# Patient Record
Sex: Female | Born: 1965
Health system: Southern US, Community
[De-identification: ages and names within clinical notes are randomized; demographics above are authoritative.]

## PROBLEM LIST (undated history)

## (undated) DIAGNOSIS — D649 Anemia, unspecified: Secondary | ICD-10-CM

## (undated) DIAGNOSIS — E079 Disorder of thyroid, unspecified: Secondary | ICD-10-CM

## (undated) DIAGNOSIS — E039 Hypothyroidism, unspecified: Secondary | ICD-10-CM

## (undated) DIAGNOSIS — D496 Neoplasm of unspecified behavior of brain: Secondary | ICD-10-CM

## (undated) DIAGNOSIS — C801 Malignant (primary) neoplasm, unspecified: Secondary | ICD-10-CM

## (undated) DIAGNOSIS — F419 Anxiety disorder, unspecified: Secondary | ICD-10-CM

## (undated) DIAGNOSIS — F32A Depression, unspecified: Secondary | ICD-10-CM

## (undated) DIAGNOSIS — M1990 Unspecified osteoarthritis, unspecified site: Secondary | ICD-10-CM

## (undated) DIAGNOSIS — M75100 Unspecified rotator cuff tear or rupture of unspecified shoulder, not specified as traumatic: Secondary | ICD-10-CM

## (undated) HISTORY — DX: Neoplasm of unspecified behavior of brain: D49.6

## (undated) HISTORY — DX: Depression, unspecified: F32.A

## (undated) HISTORY — DX: Anemia, unspecified: D64.9

## (undated) HISTORY — DX: Unspecified rotator cuff tear or rupture of unspecified shoulder, not specified as traumatic: M75.100

## (undated) HISTORY — DX: Malignant (primary) neoplasm, unspecified: C80.1

## (undated) HISTORY — PX: TUBAL LIGATION: SHX77

## (undated) HISTORY — DX: Unspecified osteoarthritis, unspecified site: M19.90

## (undated) HISTORY — DX: Hypothyroidism, unspecified: E03.9

## (undated) HISTORY — PX: BRAIN SURGERY: SHX531

## (undated) HISTORY — DX: Anxiety disorder, unspecified: F41.9

## (undated) HISTORY — PX: EYE SURGERY: SHX253

## (undated) HISTORY — DX: Disorder of thyroid, unspecified: E07.9

---

## 1999-05-19 ENCOUNTER — Encounter: Payer: Self-pay | Admitting: Family Medicine

## 1999-05-19 ENCOUNTER — Encounter: Admission: RE | Admit: 1999-05-19 | Discharge: 1999-05-19 | Payer: Self-pay | Admitting: Family Medicine

## 1999-05-19 HISTORY — PX: OTHER SURGICAL HISTORY: SHX169

## 2000-08-19 ENCOUNTER — Encounter: Payer: Self-pay | Admitting: Family Medicine

## 2000-09-11 ENCOUNTER — Other Ambulatory Visit: Admission: RE | Admit: 2000-09-11 | Discharge: 2000-09-11 | Payer: Self-pay | Admitting: Obstetrics and Gynecology

## 2001-01-01 ENCOUNTER — Encounter: Admission: RE | Admit: 2001-01-01 | Discharge: 2001-02-06 | Payer: Self-pay | Admitting: Occupational Medicine

## 2001-01-30 ENCOUNTER — Ambulatory Visit (HOSPITAL_COMMUNITY): Admission: RE | Admit: 2001-01-30 | Discharge: 2001-01-30 | Payer: Self-pay | Admitting: Obstetrics & Gynecology

## 2001-01-30 ENCOUNTER — Encounter: Payer: Self-pay | Admitting: Obstetrics & Gynecology

## 2001-12-03 ENCOUNTER — Other Ambulatory Visit: Admission: RE | Admit: 2001-12-03 | Discharge: 2001-12-03 | Payer: Self-pay | Admitting: Gynecology

## 2002-05-22 ENCOUNTER — Other Ambulatory Visit: Admission: RE | Admit: 2002-05-22 | Discharge: 2002-05-22 | Payer: Self-pay | Admitting: Obstetrics and Gynecology

## 2002-07-03 ENCOUNTER — Encounter: Payer: Self-pay | Admitting: Obstetrics and Gynecology

## 2002-07-03 ENCOUNTER — Ambulatory Visit (HOSPITAL_COMMUNITY): Admission: RE | Admit: 2002-07-03 | Discharge: 2002-07-03 | Payer: Self-pay | Admitting: Obstetrics and Gynecology

## 2002-11-09 ENCOUNTER — Inpatient Hospital Stay (HOSPITAL_COMMUNITY): Admission: AD | Admit: 2002-11-09 | Discharge: 2002-11-09 | Payer: Self-pay | Admitting: Obstetrics and Gynecology

## 2002-11-24 ENCOUNTER — Inpatient Hospital Stay (HOSPITAL_COMMUNITY): Admission: AD | Admit: 2002-11-24 | Discharge: 2002-11-26 | Payer: Self-pay | Admitting: Obstetrics and Gynecology

## 2002-11-25 ENCOUNTER — Encounter (INDEPENDENT_AMBULATORY_CARE_PROVIDER_SITE_OTHER): Payer: Self-pay

## 2002-11-27 ENCOUNTER — Encounter: Admission: RE | Admit: 2002-11-27 | Discharge: 2002-12-27 | Payer: Self-pay | Admitting: Obstetrics and Gynecology

## 2003-09-22 ENCOUNTER — Other Ambulatory Visit: Admission: RE | Admit: 2003-09-22 | Discharge: 2003-09-22 | Payer: Self-pay | Admitting: Obstetrics and Gynecology

## 2003-11-11 ENCOUNTER — Encounter: Admission: RE | Admit: 2003-11-11 | Discharge: 2003-11-11 | Payer: Self-pay | Admitting: Obstetrics and Gynecology

## 2004-02-02 ENCOUNTER — Ambulatory Visit: Payer: Self-pay | Admitting: Family Medicine

## 2004-02-25 ENCOUNTER — Ambulatory Visit: Payer: Self-pay | Admitting: Family Medicine

## 2004-02-29 ENCOUNTER — Ambulatory Visit: Payer: Self-pay | Admitting: Family Medicine

## 2004-03-06 ENCOUNTER — Ambulatory Visit: Payer: Self-pay | Admitting: Family Medicine

## 2004-04-03 ENCOUNTER — Ambulatory Visit: Payer: Self-pay | Admitting: Family Medicine

## 2004-04-11 ENCOUNTER — Ambulatory Visit: Payer: Self-pay | Admitting: Professional

## 2004-04-20 ENCOUNTER — Ambulatory Visit: Payer: Self-pay | Admitting: Professional

## 2004-05-20 HISTORY — PX: OTHER SURGICAL HISTORY: SHX169

## 2004-06-06 ENCOUNTER — Ambulatory Visit: Payer: Self-pay | Admitting: Family Medicine

## 2004-06-07 ENCOUNTER — Inpatient Hospital Stay (HOSPITAL_COMMUNITY): Admission: EM | Admit: 2004-06-07 | Discharge: 2004-06-26 | Payer: Self-pay | Admitting: Emergency Medicine

## 2004-06-07 ENCOUNTER — Encounter: Admission: RE | Admit: 2004-06-07 | Discharge: 2004-06-07 | Payer: Self-pay | Admitting: Family Medicine

## 2004-06-09 ENCOUNTER — Encounter (INDEPENDENT_AMBULATORY_CARE_PROVIDER_SITE_OTHER): Payer: Self-pay | Admitting: *Deleted

## 2004-06-28 ENCOUNTER — Inpatient Hospital Stay (HOSPITAL_COMMUNITY): Admission: EM | Admit: 2004-06-28 | Discharge: 2004-06-30 | Payer: Self-pay | Admitting: Emergency Medicine

## 2004-07-14 ENCOUNTER — Ambulatory Visit: Payer: Self-pay | Admitting: Family Medicine

## 2004-09-20 ENCOUNTER — Ambulatory Visit: Payer: Self-pay | Admitting: Family Medicine

## 2004-10-02 ENCOUNTER — Ambulatory Visit: Payer: Self-pay | Admitting: Family Medicine

## 2004-12-28 ENCOUNTER — Ambulatory Visit (HOSPITAL_COMMUNITY): Admission: RE | Admit: 2004-12-28 | Discharge: 2004-12-28 | Payer: Self-pay | Admitting: Neurosurgery

## 2005-06-27 ENCOUNTER — Ambulatory Visit (HOSPITAL_COMMUNITY): Admission: RE | Admit: 2005-06-27 | Discharge: 2005-06-27 | Payer: Self-pay | Admitting: Neurosurgery

## 2005-07-27 IMAGING — CT CT HEAD W/O CM
1 of 2 series · 13 of 30 positions shown, 17 images · non-contrast
Comparison: none

CLINICAL DATA: Status post craniotomy.

[Series 2: brain · axial · 0.47mm/px · z∈[+122,+249]mm · 13 of 32 slices shown, 17 images]
[im 3/32  brain]
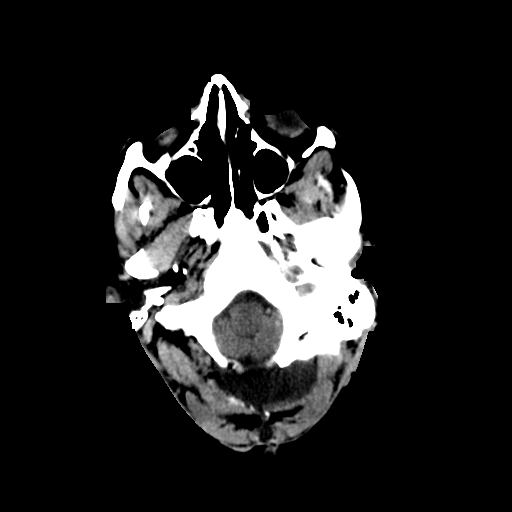
[im 3/32  bone]
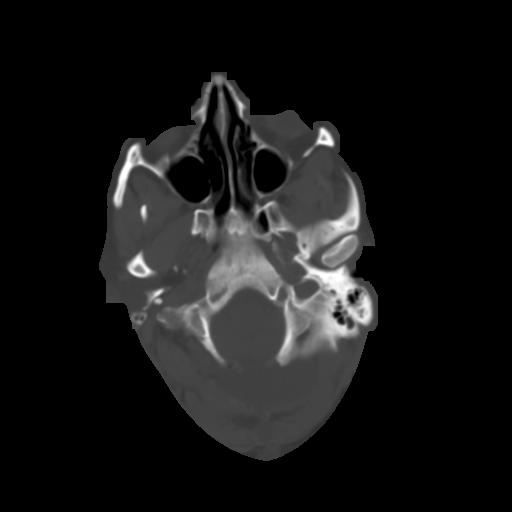
[im 5/32  brain]
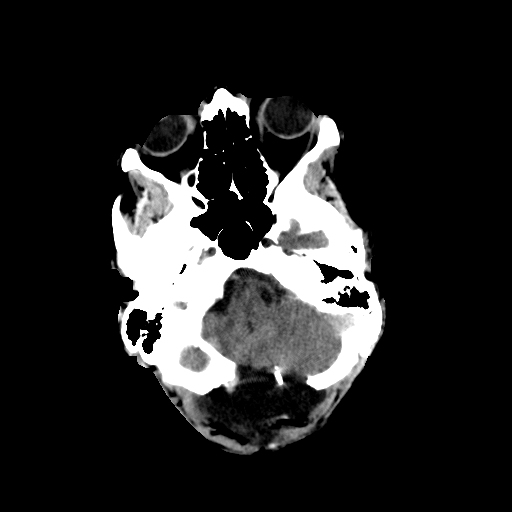
[im 7/32  brain]
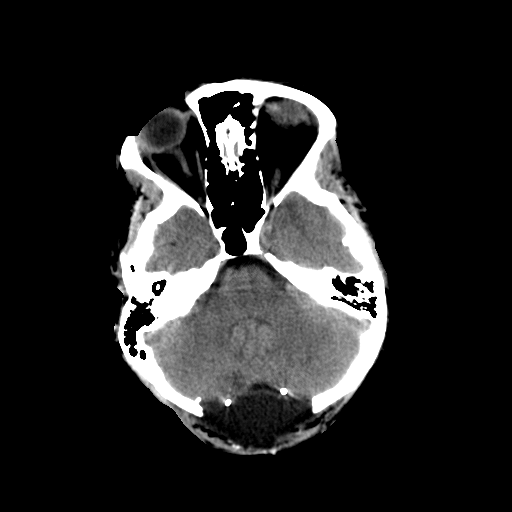
[im 9/32  brain]
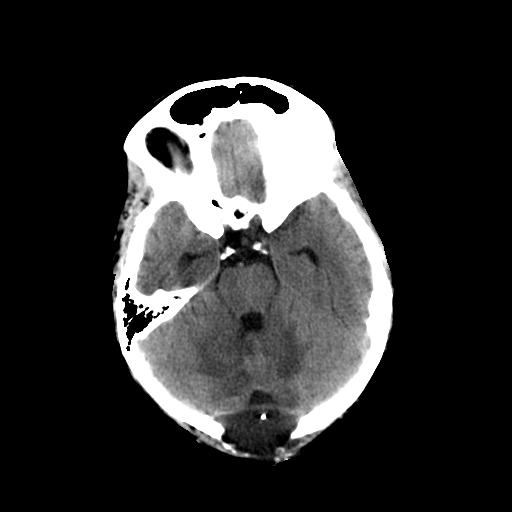
[im 12/32  brain]
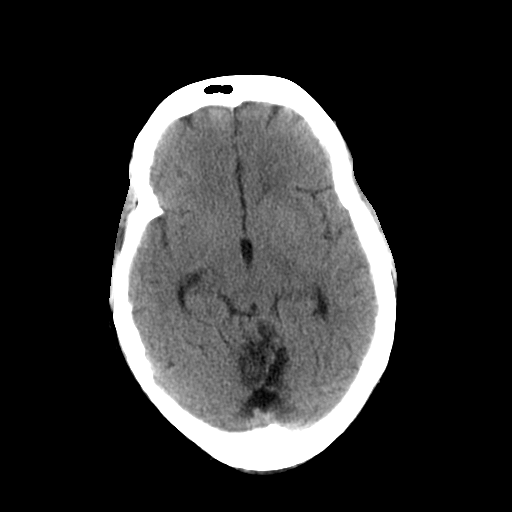
[im 12/32  bone]
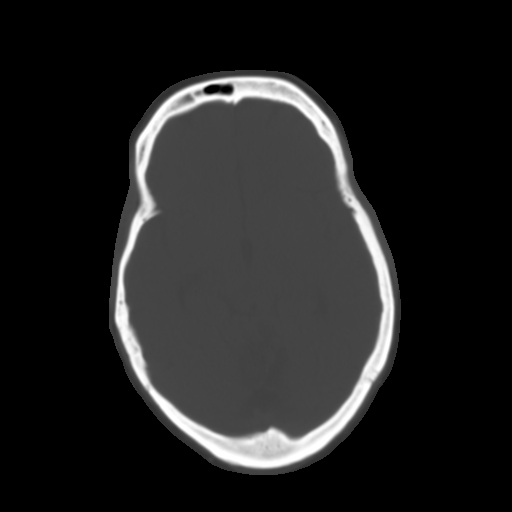
[im 14/32  brain]
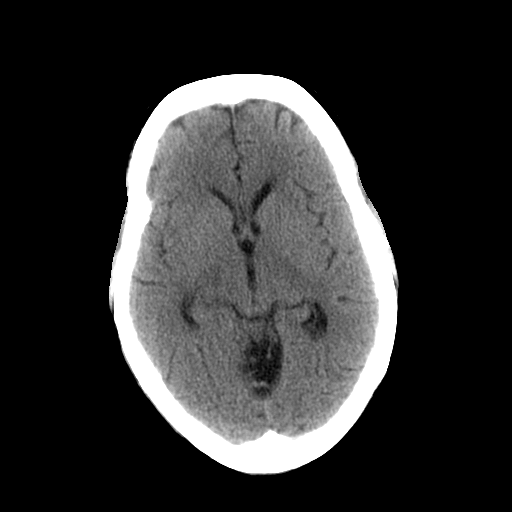
[im 16/32  brain]
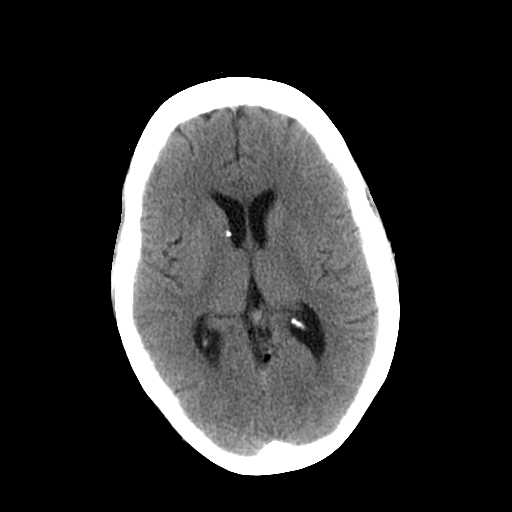
[im 18/32  brain]
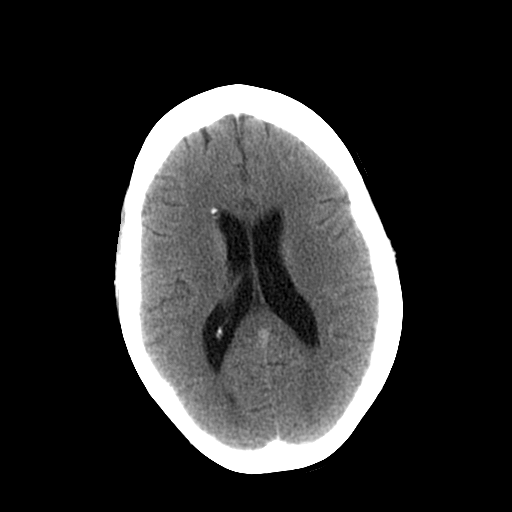
[im 20/32  brain]
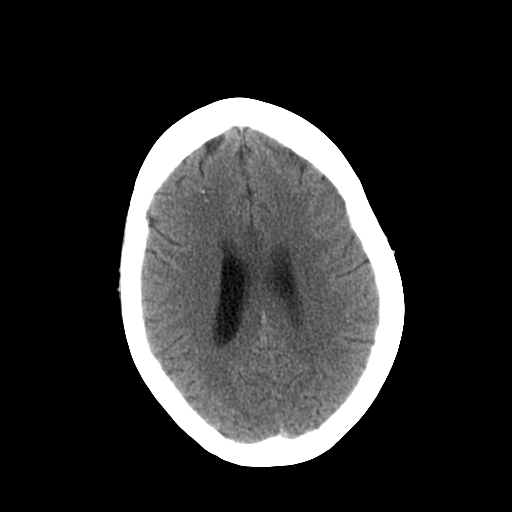
[im 20/32  bone]
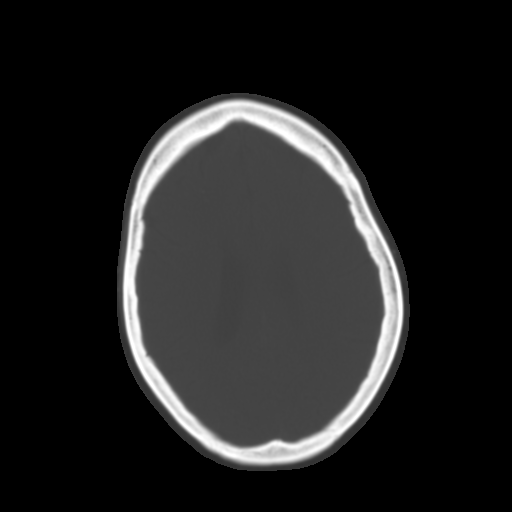
[im 23/32  brain]
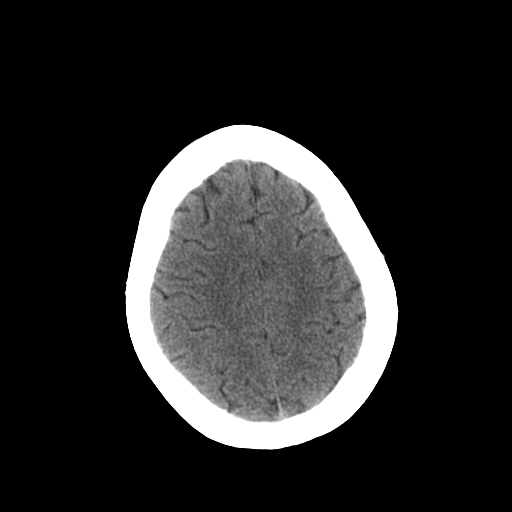
[im 25/32  brain]
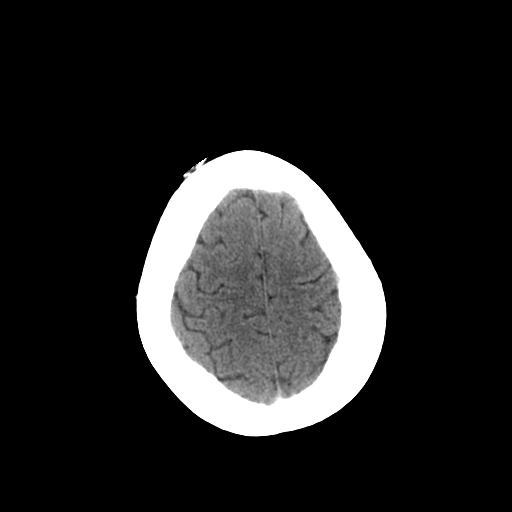
[im 27/32  brain]
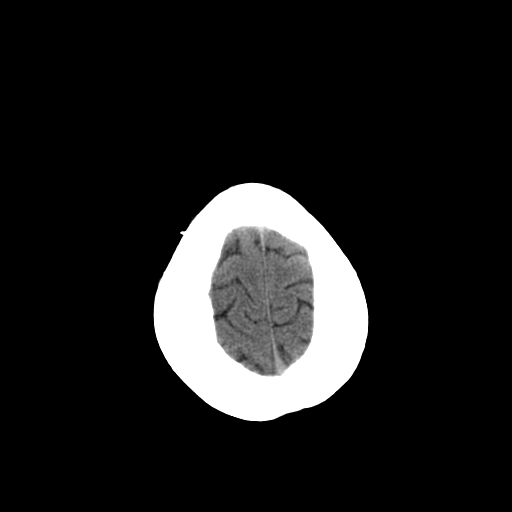
[im 29/32  brain]
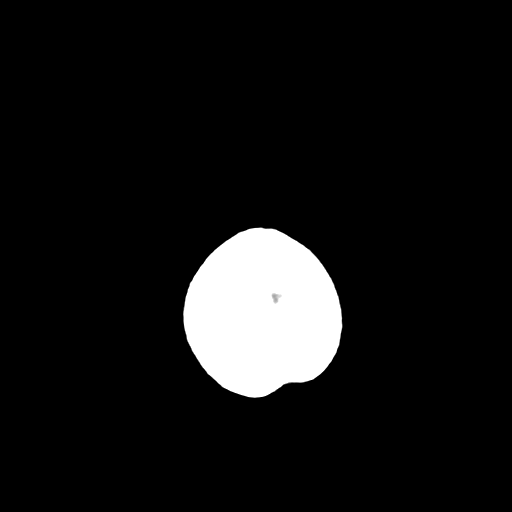
[im 29/32  bone]
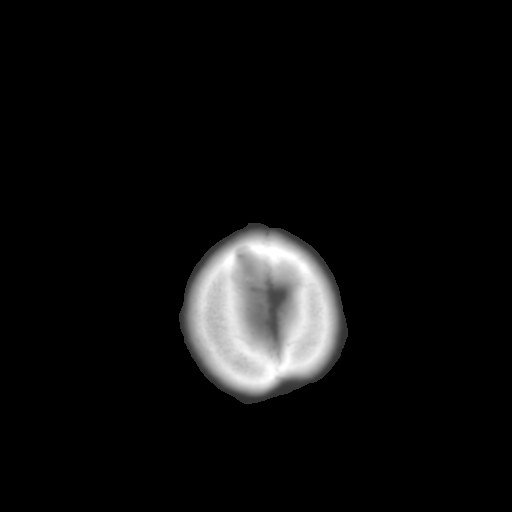

[13 of 30 positions shown; findings below may reference images not displayed]

CT head without contrast:

Comparison 06/20/2004. Changes of posterior occipital craniotomy are again evident
with extra-axial low attenuation collection at the level of the craniotomy
stable since previous exam. There is persistent low attenuation in the vermis
and cerebellar hemispheres centrally. No hydrocephalus. Calcifications along
presumed track of previous right frontal ventriculostomy catheter are noted.
Negative for acute intracranial hemorrhage, midline shift, or mass-effect. Bone
windows demonstrate postoperative changes as before. There is a prominent
retention cyst or polyp incidentally noted in the right maxillary sinus.
IMPRESSION: 1. Stable appearance since 06/20/2004, with postoperative changes and cerebellar  
edema as detailed above.
2. No hydrocephalus

## 2005-08-02 IMAGING — CT CT HEAD W/O CM
1 of 2 series · 13 of 30 positions shown, 17 images · IV contrast (agent unspecified)
Comparison: none

CLINICAL DATA: 39-year-old; headache; nausea and vomiting; hx of brain tumor removal
 CT HEAD WITHOUT CONTRAST:
 Standard head CT was performed without contrast and is compared to previous from 06/24/04.
 The ventricular catheters are unchanged.  The tip of the larger catheter appears to be in the left basal ganglia region and the tip of the smaller catheter does not appear to be in the ventricle.  There are post surgical changes involving the posterior fossa with a postoperative fluid collection which is stable.  Small area of probable encephalomalacia.  The ventricles are in the midline without mass effect or shift.  No evidence for intracranial hemorrhage or edema.

[Series 2: brain · axial · 0.47mm/px · z∈[+130,+260]mm · 13 of 32 slices shown, 17 images]
[im 3/32  brain]
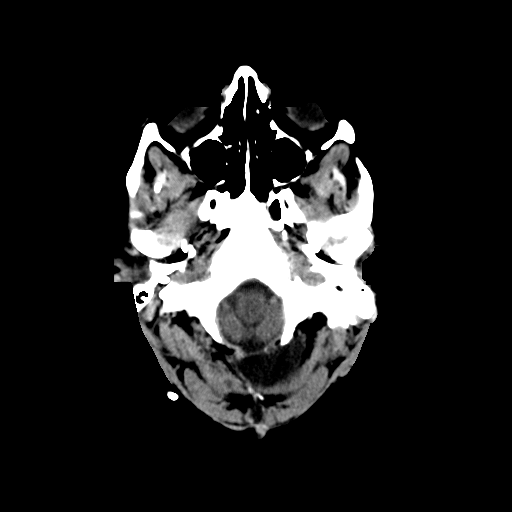
[im 3/32  bone]
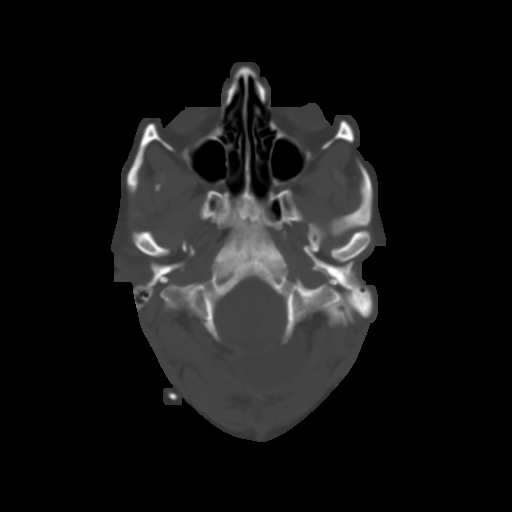
[im 5/32  brain]
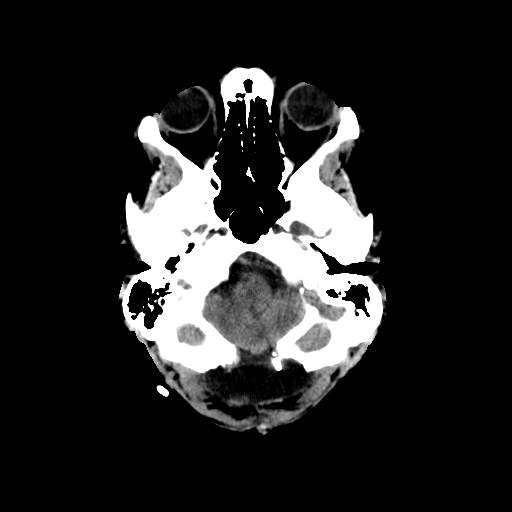
[im 7/32  brain]
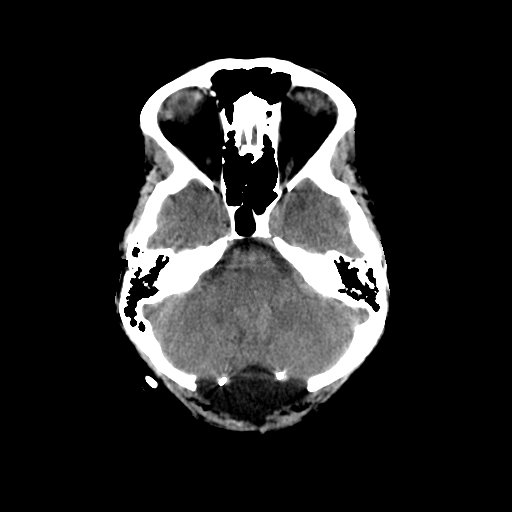
[im 9/32  brain]
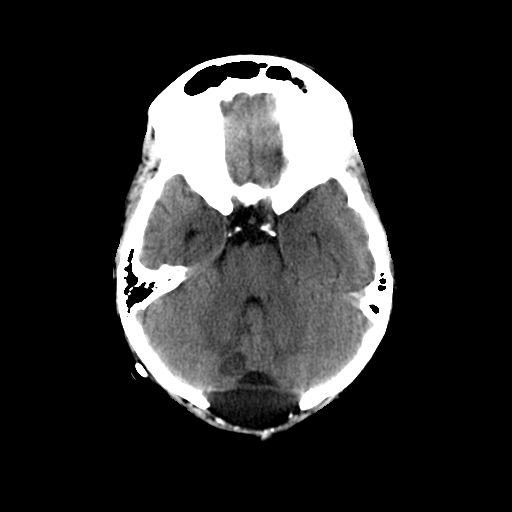
[im 12/32  brain]
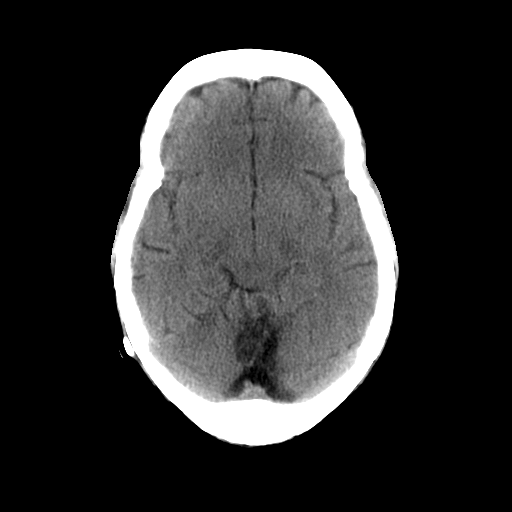
[im 12/32  bone]
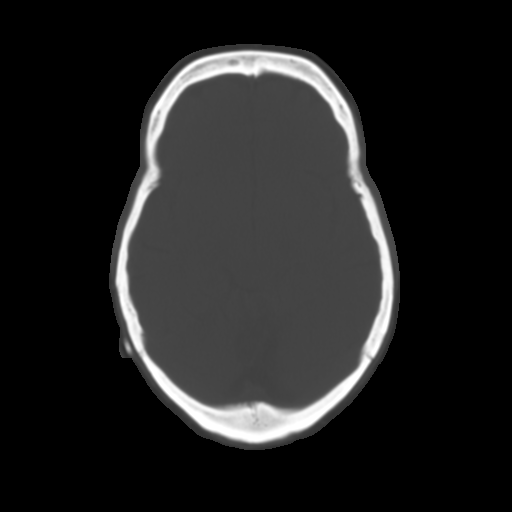
[im 14/32  brain]
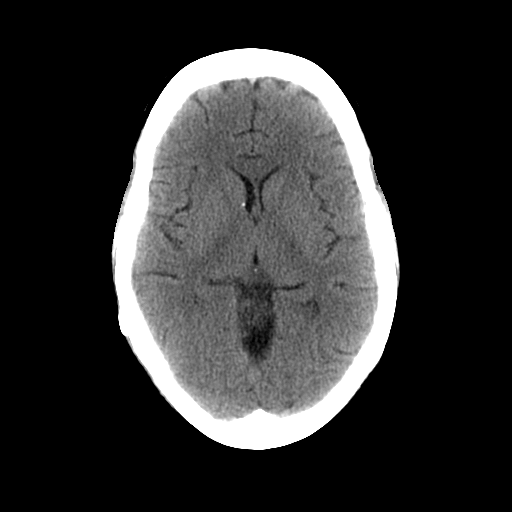
[im 16/32  brain]
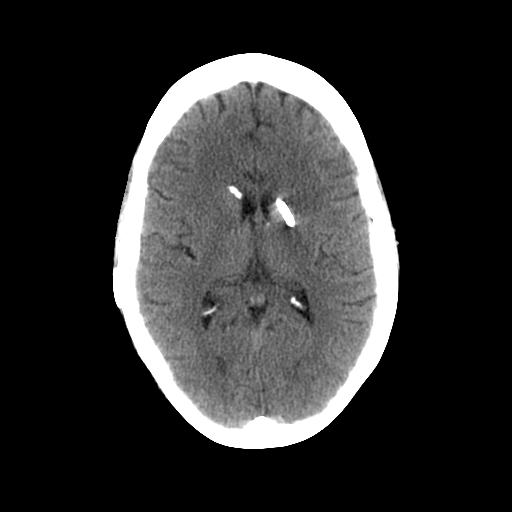
[im 18/32  brain]
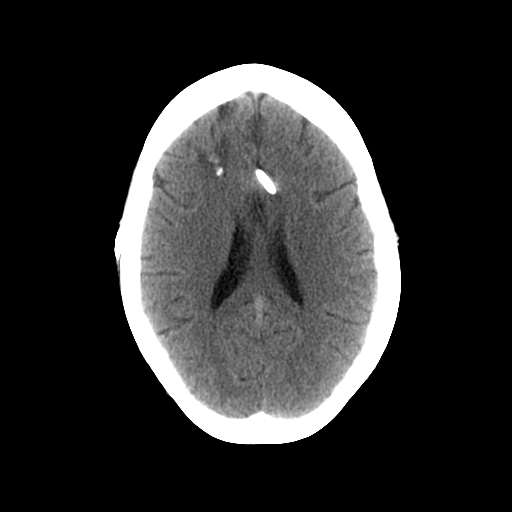
[im 20/32  brain]
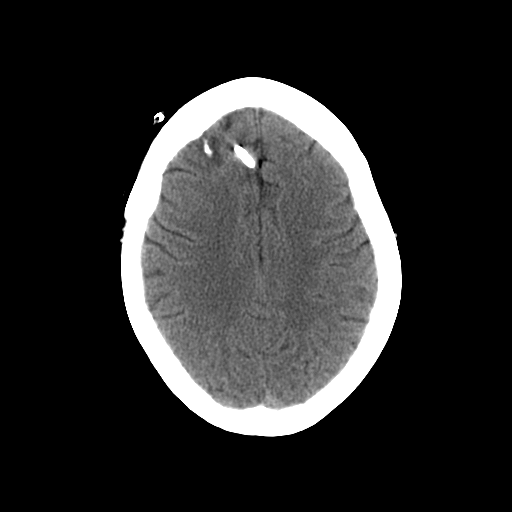
[im 20/32  bone]
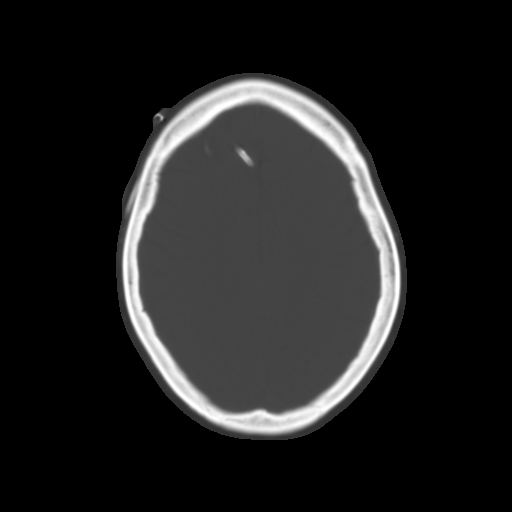
[im 23/32  brain]
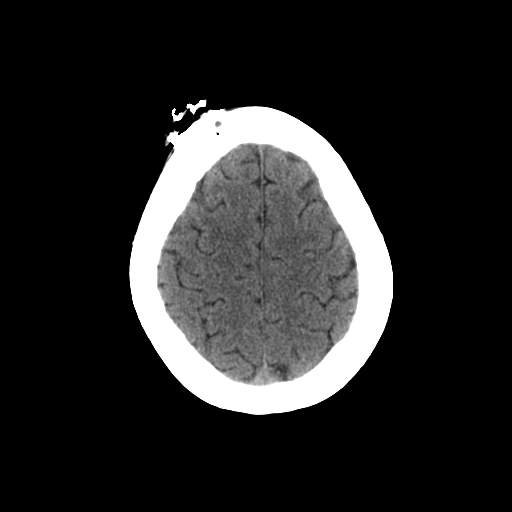
[im 25/32  brain]
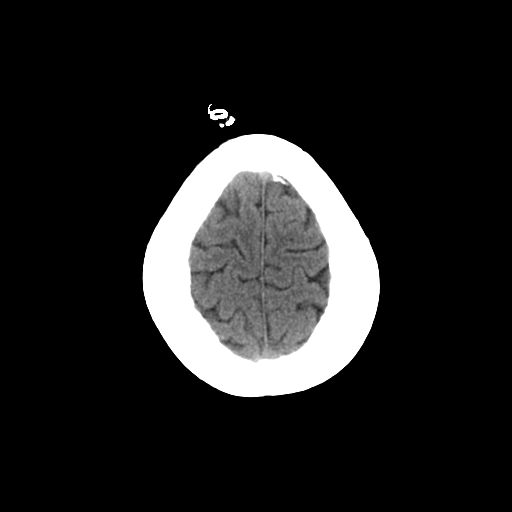
[im 27/32  brain]
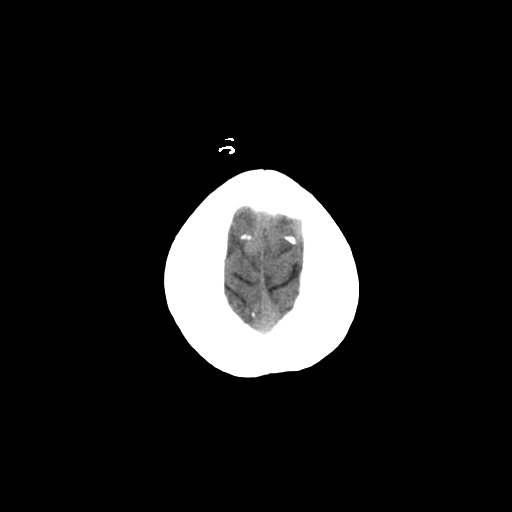
[im 29/32  brain]
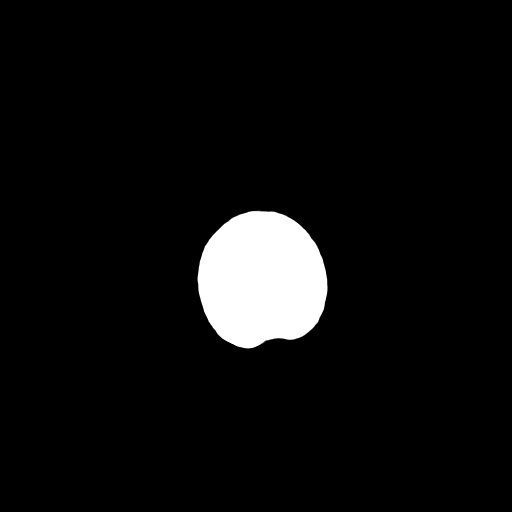
[im 29/32  bone]
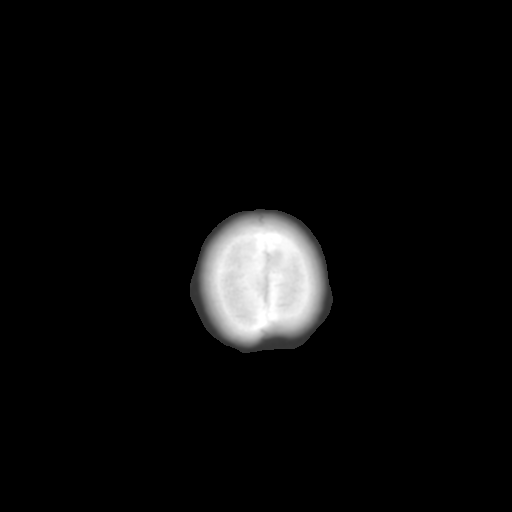

[13 of 30 positions shown; findings below may reference images not displayed]

IMPRESSION: Stable head CT.  No new/acute intracranial findings. Ventricular catheters as discussed above.

## 2005-08-23 ENCOUNTER — Ambulatory Visit: Payer: Self-pay | Admitting: Family Medicine

## 2005-11-23 ENCOUNTER — Other Ambulatory Visit: Admission: RE | Admit: 2005-11-23 | Discharge: 2005-11-23 | Payer: Self-pay | Admitting: Obstetrics and Gynecology

## 2005-12-20 ENCOUNTER — Encounter: Admission: RE | Admit: 2005-12-20 | Discharge: 2005-12-20 | Payer: Self-pay | Admitting: Obstetrics and Gynecology

## 2006-01-22 ENCOUNTER — Ambulatory Visit: Payer: Self-pay | Admitting: Family Medicine

## 2006-06-26 ENCOUNTER — Ambulatory Visit (HOSPITAL_COMMUNITY): Admission: RE | Admit: 2006-06-26 | Discharge: 2006-06-26 | Payer: Self-pay | Admitting: Neurosurgery

## 2006-07-04 ENCOUNTER — Ambulatory Visit: Payer: Self-pay | Admitting: Family Medicine

## 2006-09-09 ENCOUNTER — Encounter: Payer: Self-pay | Admitting: Family Medicine

## 2006-09-09 DIAGNOSIS — R87619 Unspecified abnormal cytological findings in specimens from cervix uteri: Secondary | ICD-10-CM

## 2006-09-09 DIAGNOSIS — R7402 Elevation of levels of lactic acid dehydrogenase (LDH): Secondary | ICD-10-CM | POA: Insufficient documentation

## 2006-09-09 DIAGNOSIS — R740 Nonspecific elevation of levels of transaminase and lactic acid dehydrogenase [LDH]: Secondary | ICD-10-CM

## 2006-09-09 DIAGNOSIS — M542 Cervicalgia: Secondary | ICD-10-CM

## 2006-09-09 DIAGNOSIS — E039 Hypothyroidism, unspecified: Secondary | ICD-10-CM

## 2006-09-09 DIAGNOSIS — D649 Anemia, unspecified: Secondary | ICD-10-CM | POA: Insufficient documentation

## 2006-09-10 ENCOUNTER — Ambulatory Visit: Payer: Self-pay | Admitting: Family Medicine

## 2006-09-10 DIAGNOSIS — D239 Other benign neoplasm of skin, unspecified: Secondary | ICD-10-CM | POA: Insufficient documentation

## 2006-09-10 DIAGNOSIS — F070 Personality change due to known physiological condition: Secondary | ICD-10-CM | POA: Insufficient documentation

## 2007-01-06 ENCOUNTER — Encounter: Admission: RE | Admit: 2007-01-06 | Discharge: 2007-01-06 | Payer: Self-pay | Admitting: Obstetrics and Gynecology

## 2007-03-26 ENCOUNTER — Telehealth: Payer: Self-pay | Admitting: Family Medicine

## 2007-04-24 ENCOUNTER — Ambulatory Visit: Payer: Self-pay | Admitting: Family Medicine

## 2007-04-24 DIAGNOSIS — F519 Sleep disorder not due to a substance or known physiological condition, unspecified: Secondary | ICD-10-CM | POA: Insufficient documentation

## 2007-05-12 ENCOUNTER — Encounter: Payer: Self-pay | Admitting: Family Medicine

## 2007-06-19 ENCOUNTER — Telehealth: Payer: Self-pay | Admitting: Family Medicine

## 2007-06-23 ENCOUNTER — Encounter: Admission: RE | Admit: 2007-06-23 | Discharge: 2007-06-23 | Payer: Self-pay | Admitting: Neurosurgery

## 2007-07-17 ENCOUNTER — Encounter: Payer: Self-pay | Admitting: Family Medicine

## 2007-10-17 ENCOUNTER — Ambulatory Visit: Payer: Self-pay | Admitting: Family Medicine

## 2007-11-04 ENCOUNTER — Ambulatory Visit: Payer: Self-pay | Admitting: Family Medicine

## 2007-11-04 ENCOUNTER — Encounter: Admission: RE | Admit: 2007-11-04 | Discharge: 2007-11-04 | Payer: Self-pay | Admitting: Family Medicine

## 2007-11-04 DIAGNOSIS — M948X9 Other specified disorders of cartilage, unspecified sites: Secondary | ICD-10-CM

## 2007-11-21 ENCOUNTER — Encounter (INDEPENDENT_AMBULATORY_CARE_PROVIDER_SITE_OTHER): Payer: Self-pay | Admitting: *Deleted

## 2008-01-19 ENCOUNTER — Encounter: Admission: RE | Admit: 2008-01-19 | Discharge: 2008-01-19 | Payer: Self-pay | Admitting: Obstetrics and Gynecology

## 2008-02-17 ENCOUNTER — Ambulatory Visit: Payer: Self-pay | Admitting: Family Medicine

## 2008-02-24 ENCOUNTER — Telehealth: Payer: Self-pay | Admitting: Family Medicine

## 2008-05-06 ENCOUNTER — Ambulatory Visit: Payer: Self-pay | Admitting: Family Medicine

## 2008-05-06 DIAGNOSIS — M7750 Other enthesopathy of unspecified foot: Secondary | ICD-10-CM | POA: Insufficient documentation

## 2008-05-20 ENCOUNTER — Ambulatory Visit: Payer: Self-pay | Admitting: Family Medicine

## 2008-05-20 DIAGNOSIS — M722 Plantar fascial fibromatosis: Secondary | ICD-10-CM

## 2008-07-05 ENCOUNTER — Encounter: Admission: RE | Admit: 2008-07-05 | Discharge: 2008-07-05 | Payer: Self-pay | Admitting: Neurosurgery

## 2008-07-13 ENCOUNTER — Encounter: Payer: Self-pay | Admitting: Family Medicine

## 2008-07-16 ENCOUNTER — Telehealth: Payer: Self-pay | Admitting: Family Medicine

## 2008-07-21 ENCOUNTER — Ambulatory Visit: Payer: Self-pay | Admitting: Family Medicine

## 2008-07-27 LAB — CONVERTED CEMR LAB
Free T4: 0.7 ng/dL (ref 0.6–1.6)
TSH: 3.45 microintl units/mL (ref 0.35–5.50)

## 2008-09-19 HISTORY — PX: OTHER SURGICAL HISTORY: SHX169

## 2008-10-04 ENCOUNTER — Encounter: Payer: Self-pay | Admitting: Family Medicine

## 2008-10-05 ENCOUNTER — Ambulatory Visit: Payer: Self-pay | Admitting: Family Medicine

## 2008-10-05 LAB — CONVERTED CEMR LAB
Casts: 0 /lpf
Epithelial cells, urine: 1 /lpf
Glucose, Urine, Semiquant: NEGATIVE
Mucus, UA: 0
Nitrite: NEGATIVE
Protein, U semiquant: NEGATIVE
Urine crystals, microscopic: 0 /hpf
Yeast, UA: 0
pH: 6

## 2008-10-06 ENCOUNTER — Encounter: Admission: RE | Admit: 2008-10-06 | Discharge: 2008-10-06 | Payer: Self-pay | Admitting: Family Medicine

## 2008-10-08 LAB — CONVERTED CEMR LAB
ALT: 19 units/L (ref 0–35)
AST: 21 units/L (ref 0–37)
Albumin: 3.9 g/dL (ref 3.5–5.2)
Alkaline Phosphatase: 68 units/L (ref 39–117)
BUN: 13 mg/dL (ref 6–23)
Basophils Absolute: 0 10*3/uL (ref 0.0–0.1)
Basophils Relative: 0.1 % (ref 0.0–3.0)
Bilirubin, Direct: 0.1 mg/dL (ref 0.0–0.3)
Calcium: 8.9 mg/dL (ref 8.4–10.5)
Cholesterol: 137 mg/dL (ref 0–200)
Creatinine, Ser: 0.8 mg/dL (ref 0.4–1.2)
GFR calc non Af Amer: 83 mL/min (ref >60)
Glucose, Bld: 94 mg/dL (ref 70–99)
LDL Cholesterol: 82 mg/dL (ref 0–99)
Lymphocytes Relative: 17.7 % (ref 12.0–46.0)
Lymphs Abs: 1.6 10*3/uL (ref 0.7–4.0)
Monocytes Absolute: 0.6 10*3/uL (ref 0.1–1.0)
Neutro Abs: 6.8 10*3/uL (ref 1.4–7.7)
Total CHOL/HDL Ratio: 4
WBC: 9.1 10*3/uL (ref 4.5–10.5)

## 2008-11-24 ENCOUNTER — Ambulatory Visit: Payer: Self-pay | Admitting: Family Medicine

## 2008-12-28 ENCOUNTER — Ambulatory Visit: Payer: Self-pay | Admitting: Family Medicine

## 2008-12-30 ENCOUNTER — Ambulatory Visit: Payer: Self-pay | Admitting: Family Medicine

## 2008-12-30 LAB — CONVERTED CEMR LAB
Free T4: 0.9 ng/dL (ref 0.6–1.6)
TSH: 3.3 microintl units/mL (ref 0.35–5.50)

## 2009-01-31 ENCOUNTER — Encounter: Admission: RE | Admit: 2009-01-31 | Discharge: 2009-01-31 | Payer: Self-pay | Admitting: Obstetrics and Gynecology

## 2009-06-15 ENCOUNTER — Ambulatory Visit: Payer: Self-pay | Admitting: Family Medicine

## 2009-06-15 DIAGNOSIS — F4321 Adjustment disorder with depressed mood: Secondary | ICD-10-CM

## 2009-06-15 DIAGNOSIS — R030 Elevated blood-pressure reading, without diagnosis of hypertension: Secondary | ICD-10-CM

## 2009-06-28 ENCOUNTER — Ambulatory Visit: Payer: Self-pay | Admitting: Professional

## 2009-07-05 ENCOUNTER — Ambulatory Visit: Payer: Self-pay | Admitting: Professional

## 2009-07-14 ENCOUNTER — Ambulatory Visit: Payer: Self-pay | Admitting: Family Medicine

## 2009-07-14 ENCOUNTER — Ambulatory Visit: Payer: Self-pay | Admitting: Professional

## 2009-07-14 DIAGNOSIS — L299 Pruritus, unspecified: Secondary | ICD-10-CM | POA: Insufficient documentation

## 2009-07-25 ENCOUNTER — Ambulatory Visit: Payer: Self-pay | Admitting: Professional

## 2009-08-25 ENCOUNTER — Ambulatory Visit: Payer: Self-pay | Admitting: Family Medicine

## 2009-08-25 DIAGNOSIS — M25559 Pain in unspecified hip: Secondary | ICD-10-CM | POA: Insufficient documentation

## 2009-09-28 ENCOUNTER — Telehealth: Payer: Self-pay | Admitting: Family Medicine

## 2010-02-08 ENCOUNTER — Ambulatory Visit: Payer: Self-pay | Admitting: Family Medicine

## 2010-02-09 ENCOUNTER — Telehealth: Payer: Self-pay | Admitting: Family Medicine

## 2010-03-14 ENCOUNTER — Encounter
Admission: RE | Admit: 2010-03-14 | Discharge: 2010-03-14 | Payer: Self-pay | Source: Home / Self Care | Attending: Obstetrics and Gynecology | Admitting: Obstetrics and Gynecology

## 2010-03-21 NOTE — Letter (Signed)
Summary: Out of Work  [facility]  [address] [place], [place] [address]   Phone: [phone]  Fax: [phone]    June 15, 2009   Employee:  DALEIZA BACCHI [facility]    To Whom It May Concern:   For Medical reasons, please excuse the above named employee from work for the following dates:  Start:   06/15/2009  End:   can retun 4/28 if she is feeling better   If you need additional information, please feel free to contact our office.         Sincerely,    Judith Part MD

## 2010-03-21 NOTE — Assessment & Plan Note (Signed)
Summary: ROA FOR 4-6 WEEK FOLLOW-UP/JRR   Vital Signs:  Patient profile:   45 year old female Height:      67 inches Weight:      192 pounds BMI:     30.18 Temp:     98.4 degrees F oral Pulse rate:   64 / minute Pulse rhythm:   regular BP sitting:   110 / 72  (left arm) Cuff size:   large  Vitals Entered By: Lewanda Rife LPN (Jul 14, 2009 8:48 AM) CC: 4-6 wk f/u   History of Present Illness: here for f/u of depression   wt is down 3 lb-- is eating better and more exercise  no major change in headaches  started on wellbutrin xl laast visit and ref to counseling  is a little improvement  is less tearful  still moody   she is worse at period time   has rash and itch  ? if from the drug or not  has also been working outside and sweating  is all over  takes tylenol pm at night  put cortisone cream otc   bp is good today  Allergies (verified): 1)  ! 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 Eyes:  Denies blurring and eye irritation. CV:  Denies chest pain or discomfort, palpitations, shortness of breath with exertion, and swelling of feet. GI:  Denies abdominal pain, change in bowel habits, indigestion, and nausea. MS:  Denies muscle aches. Derm:   Complains of itching and rash; denies lesion(s) and poor wound healing. Neuro:  Denies numbness and tingling. Psych:  Complains of anxiety and depression; denies panic attacks, sense of great danger, and suicidal thoughts/plans. Endo:  Denies cold intolerance, excessive thirst, excessive urination, and heat intolerance. Heme:  Denies abnormal bruising and bleeding.  Physical Exam  General:  overweight but generally well appearing  Head:  normocephalic, atraumatic, and no abnormalities observed.   Eyes:  vision grossly intact, pupils equal, pupils round, pupils reactive to light, and no injection.   Mouth:  pharynx pink and moist.   Neck:  supple with full rom and no masses or thyromegally, no JVD or carotid bruit  Lungs:  Normal respiratory effort, chest expands symmetrically. Lungs are clear to auscultation, no crackles or wheezes. Heart:  Normal rate and regular rhythm. S1 and S2 normal without gallop, murmur, click, rub or other extra sounds. Msk:  No deformity or scoliosis noted of thoracic or lumbar spine.   Extremities:  No clubbing, cyanosis, edema, or deformity noted with normal full range of motion of all joints.   Neurologic:  cranial nerves II-XII intact, gait normal, DTRs symmetrical and normal, and toes down bilaterally on Babinski.   Skin:  few papules around ankles  some excoriations  Cervical Nodes:  No lymphadenopathy noted Psych:  improved affect overall - less tearful today   Impression & Recommendations:  Problem # 1:  DEPRESSION, SITUATIONAL (ICD-309.0) Assessment Improved  overall a bit better with wellbutrin and counseling - but may have rash from wellbutrin disc hormonal component will change to ssri- zoloft and update disc poss side eff- update  get back to exercise continue counseling   Orders: Prescription Created Electronically [phone])  Problem # 2:  PRURITUS (ICD-698.9)  with very little rash  will tx with zyrtec for 1-2 wk while getting off  wellbutrin (poss cause )  update if not imp  Orders: Prescription Created Electronically [phone])  Complete Medication List: 1)  Synthroid 75 Mcg Tabs (Levothyroxine sodium) .... Take one by mouth daily 2)  Zyrtec Allergy 10 Mg Tabs (Cetirizine hcl) .... Take one by mouth daily as needed 3)  Multiple Vitamins Tabs (Multiple vitamin) .... Take one by mouth daily 4)  Tylenol Pm Extra Strength 500-25 Mg Tabs (Diphenhydramine-apap (sleep)) .... Otc as directed. 5)  Zoloft 50 Mg Tabs (Sertraline hcl) .... 1/2 pill each evening for 2 weeks and then increase to 1 pill each evening  Patient Instructions: 1)  stop the wellbutrin 2)  start zoloft 50 mg -- 1/2 pill per day in evening for 2 weeks then increase to 1 pill daily in evening  3)  if itching does not go away - please let me know  4)  get back on zyrtec for itching daily for next 1-2 weeks  5)  if worse depression or anxiety or any suicidal thoughts- update me  6)  follow up with me in 6 weeks  Prescriptions: ZOLOFT 50 MG TABS (SERTRALINE HCL) 1/2 pill each evening for 2 weeks and then increase to 1 pill each evening  #30 x 11   Entered and Authorized by:   Judith Part MD   Signed by:   Judith Part MD on 07/14/2009   Method used:   Electronically to        [id number] (retail)       [address] [place], [place]  [address]       Ph: [phone]       Fax: [phone]   RxID:   [id number]   Current Allergies (reviewed today): ! [facility]

## 2010-03-21 NOTE — Assessment & Plan Note (Signed)
Summary: HIGH BLOOD PRESSURE/HEADACHES/NOSE BLEEDS/DLO   Vital Signs:  Patient profile:   45 year old female Height:      67 inches Weight:      195 pounds BMI:     30.65 Temp:     97.9 degrees F oral Pulse rate:   68 / minute Pulse rhythm:   regular BP sitting:   110 / 72  (left arm) Cuff size:   large  Vitals Entered By: Lewanda Rife LPN (June 15, 2009 12:48 PM) CC: Was told at another dr. offices one month ago BP was elevated. Pt not sure of reading, has h/a and over weekend has nosebleed.   History of Present Illness: was at gyn march 15th-- and her bp was high -- checked it twice  and does not know how high   has not checked otherwise  bp good today   this weekend - had a very stressful weekend with her family  then got a headache--not severe - came and went  had a short nosebleed on saturday-- thinks was just the R side  monday- felt some vague pain and took some aspirin    is very stressed -- "not handling things well "  always angry or crying  is negative in attitude  no libido  gets frustrated with her memory since her brain tumor  cries all the time  has had in the past -- has pushed it away   stress- everything is changed at work  is very aggrivated by her husband and son    last period was 24 days late -- suspects she is starting menopausal change as well  Allergies (verified): No Known Drug Allergies  Past History:  Past Medical History: Last updated: 04/24/2007 Anemia-NOS Hypothyroidism brain tumor s/p resection  Past Surgical History: Last updated: 10/07/2008 Abd [place]- neg (05/19/1999) 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 Eyes:  Denies blurring and eye irritation. CV:  Denies chest pain or discomfort, palpitations, shortness of breath with exertion, and swelling of feet. GI:  Denies abdominal pain, change in bowel habits, indigestion, and nausea. GU:  Denies dysuria, hematuria, and urinary frequency. MS:  Denies joint pain, joint redness, joint swelling, muscle aches, and cramps. Derm:  Denies lesion(s), poor wound healing, and rash. Neuro:  Complains of headaches; denies inability to speak, numbness, poor balance, sensation of room spinning, tingling, tremors, visual disturbances, and weakness. Psych:  Complains of anxiety, depression, easily tearful, and irritability; denies sense of great danger and suicidal thoughts/plans. Endo:  Denies cold intolerance, excessive thirst, excessive urination, and heat intolerance. Heme:  Denies abnormal bruising and bleeding.  Physical Exam  General:  overweight but generally well appearing  Head:  normocephalic and atraumatic.   Eyes:  vision grossly intact, pupils equal, pupils round, and pupils reactive to light.   no conjunctival pallor, injection or icterus  Mouth:  pharynx pink and moist.   Neck:  supple with full rom and no masses or thyromegally, no JVD or carotid bruit  Lungs:  Normal respiratory effort, chest expands symmetrically. Lungs  are clear to auscultation, no crackles or wheezes. Heart:  Normal rate and regular rhythm. S1 and S2 normal without gallop, murmur, click, rub or other extra sounds. Abdomen:  no renal bruits  Pulses:  R and L carotid,radial,femoral,dorsalis pedis and posterior tibial pulses are full and equal bilaterally Extremities:  No clubbing, cyanosis, edema, or deformity noted with normal full range of motion of all joints.     Neurologic:  sensation intact to light touch, gait normal, and DTRs symmetrical and normal.   Skin:  Intact without suspicious lesions or rashes Cervical Nodes:  No lymphadenopathy noted Psych:  blunted affect with fatigue and poor eye contact  does freely disc stress at times tearful  no SI    Impression & Recommendations:  Problem # 1:  DEPRESSION, SITUATIONAL (ICD-309.0) new depression with lack of motivation/ tearfullness /fatigue/ libido loss  some perimenopausal changes and also severe situational stress (incl marriage problems) disc symptoms/ coping tech/ stressors/ pot tx opt and side eff in detail today ref to counselor who can later do couples / family counseling  start wellbutrin xl 150 daily- (rev side eff and alerted to update if worse or any suicidal thoughts) Orders: Psychology Referral (Psychology)  Problem # 2:  ELEVATED BLOOD PRESSURE WITHOUT DIAGNOSIS OF HYPERTENSION (ICD-796.2) Assessment: New bp is fine now  will continue to monitor  disc low salt diet / exercise lability may be stress related   Complete Medication List: 1)  Synthroid 75 Mcg Tabs (Levothyroxine sodium) .... Take one by mouth daily 2)  Zyrtec Allergy 10 Mg Tabs (Cetirizine hcl) .... Take one by mouth daily as needed 3)  Multiple Vitamins Tabs (Multiple vitamin) .... Take one by mouth daily 4)  Tylenol Pm Extra Strength 500-25 Mg Tabs (Diphenhydramine-apap (sleep)) .... Otc as directed. 5)  Wellbutrin Xl 150 Mg Xr24h-tab (Bupropion hcl) .Marland Kitchen.. 1 by mouth once daily in am  Patient Instructions: 1)  we will do referal to counselor at check out  2)  follow up with me in 4-6 weeks  3)  start the wellbutrin in the ams  4)  if any side effects or if you feel worse or suicidal -- please stop med and update me  Prescriptions: WELLBUTRIN XL 150 MG XR24H-TAB (BUPROPION HCL) 1 by mouth once daily in am  #30 x 11   Entered and Authorized by:   Judith Part MD   Signed by:   Judith Part MD on  06/15/2009   Method used:   Print then Give to Patient   RxID:   [id number]  Patient requested that Rx be sent to [facility] on [address] Rx faxed to pharmacy.  Linde Gillis CMA Duncan Dull)  June 15, 2009 1:35 PM   Current Allergies (reviewed today): No known allergies

## 2010-03-21 NOTE — Progress Notes (Signed)
Summary: Amoxicillin 500mg   Phone Note Refill Request Call back at [phone] Message from:  [facility]#[id number] on September 28, 2009 2:11 PM  [facility] [address] request refill on Amoxicillin 500mg  capsules #4 to take 4 capsules by mouth one hour prior to procedure. Pt said she is having a dental cleaning on Mon.Please advise.    Method Requested: Telephone to Pharmacy Initial call taken by: Lewanda Rife LPN,  September 28, 2009 2:12 PM  Follow-up for Phone Call        px written on EMR for call in  Follow-up by: Judith Part MD,  September 28, 2009 3:28 PM  Additional Follow-up for Phone Call Additional follow up Details #1::        Rx called to pharmacy Additional Follow-up by: Linde Gillis CMA Duncan Dull),  September 28, 2009 3:42 PM    New/Updated Medications: AMOXICILLIN 500 MG CAPS (AMOXICILLIN) 4 by mouth times one an hour before dental proceedure Prescriptions: AMOXICILLIN 500 MG CAPS (AMOXICILLIN) 4 by mouth times one an hour before dental proceedure  #4 x 0   Entered and Authorized by:   Judith Part MD   Signed by:   Linde Gillis CMA (AAMA) on 09/28/2009   Method used:   Telephoned to ...       [facility] [address] #[id number]* (retail)       [address] [place], [place]  [address]       Ph: [phone]       Fax: [phone]   RxID:   [id number]

## 2010-03-21 NOTE — Assessment & Plan Note (Signed)
Summary: ROA FOR 6 WEEK FOLLOW-UP/JRR   Vital Signs:  Patient profile:   45 year old female Height:      67 inches Weight:      191.50 pounds BMI:     30.10 Temp:     98.2 degrees F oral Pulse rate:   64 / minute Pulse rhythm:   regular BP sitting:   100 / 68  (left arm) Cuff size:   large  Vitals Entered By: Lewanda Rife LPN (August 25, 8293 9:02 AM) CC: six week f/u   History of Present Illness: last visit changed wellbutrin to zoloft for rash/ poss allergy  is on 50 mg now  wt is down 1 lb  overall is better  feeling good -- happier in general less likely to be tearful or irritable  family agrees   some decreased sex drive -- is tolerable   wants to stay on current dose  is more motivated to do things  has been exercising more   some swimming   her R hip is sore when she externally rotates it  this bothers her with inactivity or getting up from the floor  no popping or cracking  walking is generally ok  does bother her lying on that side in bed  has not taken med for it    good bp  Allergies: 1)  ! 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 Eyes:  Denies blurring and eye irritation. CV:  Denies chest pain or discomfort, lightheadness, and  palpitations. Resp:  Denies cough and wheezing. GI:  Denies abdominal pain, change in bowel habits, indigestion, and nausea. GU:  Complains of decreased libido; denies urinary frequency. MS:  Complains of joint pain; denies muscle aches, cramps, and muscle weakness. Derm:  Denies itching, lesion(s), and rash. Neuro:  Denies headaches, numbness, and tingling. Psych:  Denies panic attacks, sense of great danger, and suicidal thoughts/plans; mood is improved. Endo:  Denies cold intolerance and excessive urination. Heme:  Denies abnormal bruising and bleeding.  Physical Exam  General:  overweight but generally well appearing  Head:  normocephalic, atraumatic, and no abnormalities observed.   Eyes:  vision grossly intact, pupils equal, pupils round, pupils reactive to light, and no injection.   Mouth:  pharynx pink and moist.   Neck:  supple with full rom and no masses or thyromegally, no JVD or carotid bruit  Lungs:  Normal respiratory effort, chest expands symmetrically. Lungs are clear to auscultation, no crackles or wheezes. Heart:  Normal rate and regular rhythm. S1 and S2 normal without gallop, murmur, click, rub or other extra sounds. Msk:  pain on int / ext rot R hip some trochanteric tenderness no LS tenderness nl SLR nl gait   Pulses:  R and L carotid,radial,femoral,dorsalis pedis and posterior tibial pulses are full and equal bilaterally Extremities:  No clubbing, cyanosis, edema, or deformity noted with normal full  range of motion of all joints.   Neurologic:  strength normal in all extremities, sensation intact to light touch, gait normal, and DTRs symmetrical and normal.   Skin:  Intact without suspicious lesions or rashes Cervical Nodes:  No lymphadenopathy noted Inguinal Nodes:  No significant adenopathy Psych:  smiling with much imp affect    Impression & Recommendations:  Problem # 1:  DEPRESSION, SITUATIONAL (ICD-309.0) Assessment Improved much improved with  zoloft - minimal side effects  continue this and exercise and venting  update if any problems  f/u 6 mo   Problem # 2:  HIP PAIN, RIGHT (ICD-719.45) Assessment: New with some troch tenderness and pain on rotation suspect overuse and bursitis trial of aleve 1-2 wk/ ice and avoidance of overuse (on vacation) disc better way to get in and out of truck  update if not impin 2wk  Complete Medication List: 1)  Synthroid 75 Mcg Tabs (Levothyroxine sodium) .... Take one by mouth daily 2)  Zyrtec Allergy 10 Mg Tabs (Cetirizine hcl) .... Take one by mouth daily as needed 3)  Multiple Vitamins Tabs (Multiple vitamin) .... Take one by mouth daily 4)  Tylenol Pm Extra Strength 500-25 Mg Tabs (Diphenhydramine-apap (sleep)) .... Otc as directed. 5)  Zoloft 50 Mg Tabs (Sertraline hcl) .... 1/2 pill each evening for 2 weeks and then increase to 1 pill each evening  Patient Instructions: 1)  for hip pain try aleve 1-2 pills two times a day with food for a week or 2  2)  can also use ice 3)  try not to over rotate hip -- move whole body first when getting out of a vehicle 4)  if not improved in 2 weeks - let me know 5)  continue zoloft without change  6)  follow up in 6 months   Current Allergies (reviewed today): ! [facility]

## 2010-03-23 NOTE — Assessment & Plan Note (Signed)
Summary: AXNIETY/DEPRESSION/DLO   Vital Signs:  Patient profile:   45 year old female Height:      67 inches Weight:      198.75 pounds BMI:     31.24 Temp:     98.6 degrees F oral Pulse rate:   72 / minute Pulse rhythm:   regular BP sitting:   108 / 66  (left arm) Cuff size:   large  Vitals Entered By: Delilah Shan CMA Duncan Dull) (February 08, 2010 3:34 PM) CC: Anxiety / depression, CHF Management   History of Present Illness: Anxiety- "everything just gets to me lately."  Was started on zoloft last year and went to therapy.  Not looking forward to xmas, which is a change. More irritable and fatigued.  No SI/HI.  Inc in stress at work.  Still with periods- "it just used to be that I would get like this around my periods, but not it's all the time".  Sx started getting worse last year.  "I'm not my normal self."  She thinks the medicine helped some, more than the counseling.     Allergies: 1)  ! Wellbutrin  Past History:  Past Medical History: Anemia-NOS Hypothyroidism brain tumor s/p resection with shunt  Family History: Reviewed history from 10/05/2008 and no changes required. Father: deceased age 1- MI, smoker Mother: alive, memory changes, thyroid problems Siblings: 2 brothers, 1 sister- all with thyroid problems sister with low heart rate no FH MDD, anxiety  Social History: Reviewed history from 10/05/2008 and no changes required. 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  From [facility]  Review of Systems       See HPI.  Otherwise negative.    Physical Exam  General:  GEN: nad, alert and oriented, affect wnl HEENT: mucous membranes moist NECK: supple w/o LA CV: rrr.  no murmur PULM: ctab, no inc wob EXT: no edema SKIN: no acute rash    Impression & Recommendations:  Problem # 1:  DEPRESSION, SITUATIONAL (ICD-309.0) >25 min spent with patient, at least half of which  was spent on counseling.  I would check her TSH and if wnl then increase the SSRI.  See notes on labs.  okay for outpatient follow up.    Problem # 2:  HYPOTHYROIDISM (ICD-244.9) See notes on labs.   Her updated medication list for this problem includes:    Synthroid 75 Mcg Tabs (Levothyroxine sodium) .Marland Kitchen... Take one by mouth daily  Orders: Venipuncture (16109) Specimen Handling (60454) TLB-TSH (Thyroid Stimulating Hormone) (84443-TSH)  Complete Medication List: 1)  Synthroid 75 Mcg Tabs (Levothyroxine sodium) .... Take one by mouth daily 2)  Zyrtec Allergy 10 Mg Tabs (Cetirizine hcl) .... Take one by mouth daily as needed 3)  Multiple Vitamins Tabs (Multiple vitamin) .... Take one by mouth daily 4)  Tylenol Pm Extra Strength 500-25 Mg Tabs (Diphenhydramine-apap (sleep)) .... Otc as directed. 5)  Zoloft 50 Mg Tabs (Sertraline hcl) .... 1/2 pill each evening for 2 weeks and then increase to 1 pill each evening 6)  Amoxicillin 500 Mg Caps (Amoxicillin) .... 4 by mouth times one an hour before dental proceedure  CHF Assessment/Plan:      The patient's current weight is 198.75 pounds.  Her previous weight was 191.50 pounds.    Patient Instructions: 1)  We'll contact you with your lab report.  I'll send notice about your meds at that point.  Let me know if you aren't improving.  Take care.    Orders Added: 1)  Est. Patient Level IV [16109] 2)  Venipuncture [60454] 3)  Specimen Handling [99000] 4)  TLB-TSH (Thyroid Stimulating Hormone) [09811-BJY]    Current Allergies (reviewed today): ! [facility]

## 2010-03-23 NOTE — Progress Notes (Signed)
  Phone Note Outgoing Call   Summary of Call: rxs sent in.  Initial call taken by: Crawford Givens MD,  February 09, 2010 1:43 PM    New/Updated Medications: ZOLOFT 100 MG TABS (SERTRALINE HCL) 1 by mouth once daily Prescriptions: SYNTHROID 75 MCG  TABS (LEVOTHYROXINE SODIUM) take one by mouth daily  #90 x 3   Entered and Authorized by:   Crawford Givens MD   Signed by:   Crawford Givens MD on 02/09/2010   Method used:   Electronically to        [facility] [address] #[id number]* (retail)       [address] [place], [place]  [address]       Ph: [phone]       Fax: [phone]   RxID:   [id number] ZOLOFT 100 MG TABS (SERTRALINE HCL) 1 by mouth once daily  #90 x 3   Entered and Authorized by:   Crawford Givens MD   Signed by:   Crawford Givens MD on 02/09/2010   Method used:   Electronically to        [facility] [address] #[id number]* (retail)       [address] [place], [place]  [address]       Ph: [phone]       Fax: [phone]   RxID:   [id number]

## 2010-04-24 ENCOUNTER — Telehealth: Payer: Self-pay | Admitting: Family Medicine

## 2010-05-02 NOTE — Progress Notes (Signed)
Summary: Amoxicillin 500mg   Phone Note Refill Request Call back at [phone] Message from:  [facility] [address] on April 24, 2010 11:49 AM  Refills Requested: Medication #1:  AMOXICILLIN 500 MG CAPS 4 by mouth times one an hour before dental proceedure.   Last Refilled: 10/18/2009 [facility] [address] electronically request refill on Amoxicillin 500mg  # 4 prior to procedure. Last refill 10/18/09.Please advise.    Method Requested: Electronic Initial call taken by: Lewanda Rife LPN,  April 24, 2010 11:50 AM  Follow-up for Phone Call        px written on EMR for call in  Follow-up by: Judith Part MD,  April 24, 2010 12:41 PM  Additional Follow-up for Phone Call Additional follow up Details #1::        Medication phoned to [facility] [address] pharmacy as instructed. Lewanda Rife LPN  April 24, 4538 3:36 PM     New/Updated Medications: AMOXICILLIN 500 MG CAPS (AMOXICILLIN) 4 by mouth times one an hour before dental proceedure Prescriptions: AMOXICILLIN 500 MG CAPS (AMOXICILLIN) 4 by mouth times one an hour before dental proceedure  #4 x 1   Entered and Authorized by:   Judith Part MD   Signed by:   Judith Part MD on 04/24/2010   Method used:   Telephoned to ...       [facility] [address] #[id number]* (retail)       [address] [place], [place]  [address]       Ph: [phone]       Fax: [phone]   RxID:   [id number]

## 2010-05-18 ENCOUNTER — Ambulatory Visit (INDEPENDENT_AMBULATORY_CARE_PROVIDER_SITE_OTHER): Payer: Federal, State, Local not specified - PPO | Admitting: Family Medicine

## 2010-05-18 ENCOUNTER — Encounter: Payer: Self-pay | Admitting: *Deleted

## 2010-05-18 DIAGNOSIS — F4321 Adjustment disorder with depressed mood: Secondary | ICD-10-CM

## 2010-05-18 DIAGNOSIS — J309 Allergic rhinitis, unspecified: Secondary | ICD-10-CM | POA: Insufficient documentation

## 2010-05-18 NOTE — Patient Instructions (Signed)
Use nasal saline for your allergies and continue the claritin.  I would start back on the sertraline by taking 1/2 tab a day for 10 days and then inc to 1 whole tab a day.  Stretch the muscles in your neck and use a heating pad as needed.  Let me know if you aren't improving.

## 2010-05-18 NOTE — Assessment & Plan Note (Addendum)
Restart SSRI, gradually in the dose and call back or follow up as needed.  Okay for outpatient follow up.  I would hold her out of work for the next few days to restart the SSRI, finish the PT and hopefully allow her mood to improve.  She is okay for outpatient fu with no SI/HI.

## 2010-05-18 NOTE — Assessment & Plan Note (Addendum)
Continue claritin and use nasal saline.  Follow up as needed. She agrees.

## 2010-05-18 NOTE — Progress Notes (Signed)
Has 3 more PT appointment for back pain; back pain is improved.  Off the sertraline since 1/12 due to concern for interaction with her prev pain meds.  She is no longer on any meds that would interact with the zoloft.  No SI/HI.  Contacts for safety.  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  Inc in crying, depressed mood, and sleep changes noted by patient.   Allergy sx have increased in the spring.  She is taking claritin but only since yesterday.  Claritin made some difference.    Meds, vitals, and allergies reviewed.   ROS: See HPI.  Otherwise, noncontributory.  GEN: nad, alert and oriented HEENT: mucous membranes moist, tm wnl, nasal epithelium injected with clear rhinorrhea.  Op wnl NECK: supple w/o LA CV: rrr.  no murmur PULM: ctab, no inc wob ABD: soft, +bs EXT: no edema SKIN: no acute rash  PSYCH: affect slightly flat, fluent speech and judgement appears intact.

## 2010-05-29 ENCOUNTER — Other Ambulatory Visit: Payer: Self-pay | Admitting: Family Medicine

## 2010-06-12 ENCOUNTER — Other Ambulatory Visit: Payer: Self-pay | Admitting: Neurosurgery

## 2010-06-12 DIAGNOSIS — D496 Neoplasm of unspecified behavior of brain: Secondary | ICD-10-CM

## 2010-06-22 ENCOUNTER — Ambulatory Visit
Admission: RE | Admit: 2010-06-22 | Discharge: 2010-06-22 | Disposition: A | Discharge status: Home / Self Care | Payer: Federal, State, Local not specified - PPO | Source: Ambulatory Visit | Attending: Neurosurgery | Admitting: Neurosurgery

## 2010-06-22 DIAGNOSIS — D496 Neoplasm of unspecified behavior of brain: Secondary | ICD-10-CM

## 2010-06-22 MED ORDER — GADOBENATE DIMEGLUMINE 529 MG/ML IV SOLN
18.0000 mL | Freq: Once | INTRAVENOUS | Status: AC | PRN
  Administered 2010-06-22: 18 mL via INTRAVENOUS

## 2010-06-30 ENCOUNTER — Other Ambulatory Visit: Payer: Self-pay | Admitting: Family Medicine

## 2010-07-02 NOTE — Telephone Encounter (Signed)
Go ahead and refil Px written for call in

## 2010-07-03 NOTE — Telephone Encounter (Signed)
Medication phoned to [facility] [phone] pharmacy as instructed.

## 2010-07-07 NOTE — Op Note (Signed)
NAME:  [name], [name]                 ACCOUNT NO.:  [id number]   MEDICAL RECORD NO.:  [id number]          PATIENT TYPE:  INP   LOCATION:  3006                         FACILITY:  [facility]   PHYSICIAN:  Coletta Memos, M.D.     DATE OF BIRTH:  April 28, 1965   DATE OF PROCEDURE:  06/22/2004  DATE OF DISCHARGE:                                 OPERATIVE REPORT   PREOPERATIVE DIAGNOSIS:  1.  Obstructive hydrocephalus.  2.  History of his posterior fossa cerebellar hemangioblastoma.   COMPLICATIONS:  None.   SURGEON:  Coletta Memos, M.D.   ASSISTANT:  Clydene Fake, M.D.   INDICATIONS:  Janet Douglas is a 45 year old who presented with a posterior  fossa tumor and obstructive hydrocephalus. She underwent a tumor resection  and did well postoperatively with ventricular drainage.  However, after the  IVC was removed, she has started to leak, spinal fluid from her incision.  Secondary to that, she had a primary closure of that site where it was  leaking and did well for close to 72 hours. She then subsequently had more  fluid leaking. Therefore, I made a decision to take her to the operating  room which was done on May 04,2006.   OPERATIVE NOTE:  [name] was brought to the operating room, intubated  and placed under general anesthetic without difficulty. She was positioned  supine with her head turned towards the left side. Head, neck, chest and  abdomen was prepped and she was draped in a sterile fashion. I made a  curvilinear incision in the right frontal region and created a bur hole. I  then turned my attention to the abdomen, opened the abdomen starting off the  midline on the right side just under the costal margin. I went through the  rectus abdominis, posterior rectus sheath and then into the peritoneum. I  then tunneled first to the postauricular region, then chest caudal to the  right breast and then from there to the abdominal incision. She did quite  well. I was unable to tunnel a  catheter through that region with Dr.  Doreen Beam assistance. We connected the tube to ventricular catheter and had  spontaneous flow of spinal fluid. We then closed in running layered fashion  after placing the abdominal end of the catheter into the peritoneum. All  wounds were then closed in layered fashion. Staples were used to  reapproximate the skin on the scalp and subcuticular stitches everywhere  else. Dermabond was used on the abdomen, breasts and postauricular incisions  and sterile dressing applied to the scalp. The patient tolerated procedure  well.      KC/MEDQ  D:  06/26/2004  T:  06/26/2004  Job:  [id number]

## 2010-07-07 NOTE — Discharge Summary (Signed)
NAME:  Janet Douglas, Janet Douglas                 ACCOUNT NO.:  [id number]   MEDICAL RECORD NO.:  [id number]          PATIENT TYPE:  INP   LOCATION:  3006                         FACILITY:  [facility]   PHYSICIAN:  Coletta Memos, M.D.     DATE OF BIRTH:  09-20-1965   DATE OF ADMISSION:  06/07/2004  DATE OF DISCHARGE:  06/26/2004                                 DISCHARGE SUMMARY   ADMITTING DIAGNOSES:  1.  Posterior fossa tumor.  2.  Obstructive hydrocephalus.   DISCHARGE DIAGNOSES:  1.  Posterior fossa hemangioblastoma, status post posterior fossa      craniectomy and tumor resection.  2.  Obstructive hydrocephalus, status post right frontal      ventriculoperitoneal shunt.   COMPLICATIONS:  None.   DISCHARGE STATUS:  Alive and well.   MEDICATIONS:  1.  Tylenol No. 3, 1-2 tablets p.o. q.6 if in pain.  2.  Ambien 5 mg p.o. at bedtime p.r.n. insomnia, 20 tablets.   INDICATIONS:  Janet Douglas is a 45 year old who presented to the [facility]  Emergency Room with ataxia, and on head CT was noted to have a large  posterior fossa mass.  She also had ventriculomegaly and what appeared to be  obstructive hydrocephalus.  She was taken to the operating room on June 09, 2004, where she underwent a posterior fossa craniectomy and had tumor  resection.  Tumor on final pathology was a hemangioblastoma.  The patient  does not have a family history or personal history of Von Hippel-Lindau.  She has had no renal problems.  The tumor was removed without difficulty.  Postoperatively, she was treated, taking care of in the intensive care unit,  and had CSF drained via ventricular catheter.  That ventricular catheter was  left in for approximately 10 days.  It was raised during that period of time  and then finally clamped.  She had no fluid leaking from her wound, so the  ventricular catheter was removed.  Forty-eight hours or so after the  ventricular catheter was removed, she had clear fluid which emanated from  the inferior portion of her incision.  Secondary that, I placed a stitch and  planned on taking her to the operating room for ventriculoperitoneal  shunting.  She was found the next morning to have a dry dressing, so I held  off on the shunt.  She did well for about close to 72 hours.  Then, she  again had fluid leaking from the wound.  I then made a decision to take her  to the operating room for ventriculoperitoneal shunting which was done on  Jun 22, 2004.  She has done quite well postoperatively.  Her wound has been  dry and clean.  The ventricular catheter is in good position.  The shunt is  in continuity, and she is doing well without headaches.  The shunt valve  pressure was set  initially to 100 mmH2O.  She has been doing well.  All wounds are clean and  dry, without signs of infection.  She had some mild ataxia, mild dysarthria  which  has improved from her preoperative state.  She is being discharged  home.  She has a return appointment to be seen in approximately 10 days for  staple removal.      KC/MEDQ  D:  06/26/2004  T:  06/26/2004  Job:  [id number]

## 2010-07-07 NOTE — Op Note (Signed)
NAME:  Janet, Douglas NO.:  [id number]   MEDICAL RECORD NO.:  [id number]          PATIENT TYPE:  INP   LOCATION:  3109                         FACILITY:  [facility]   PHYSICIAN:  Coletta Memos, M.D.     DATE OF BIRTH:  03-Dec-1965   DATE OF PROCEDURE:  06/10/2004  DATE OF DISCHARGE:                                 OPERATIVE REPORT   PREOPERATIVE DIAGNOSIS:  Cerebellar tumor.   POSTOPERATIVE DIAGNOSIS:  Cerebellar tumor, hydrocephalus, Chiari  malformation.   COMPLICATIONS:  None.   SPECIMENS:  Tumor sent to pathology.   PROCEDURE:  1.  Suboccipital craniectomy with microdissection for cerebellar tumor      resection.  2.  Placement of right frontal intraventricular catheter.  3.  Dural patch grafting and a hemilaminectomy, C1.   ANESTHESIA:  General endotracheal.   ESTIMATED BLOOD LOSS:  1500 mL.  3 units packed red blood cells given.   URINE OUTPUT:  330 mL.   INDICATIONS:  Janet Douglas is a 45 year old woman who presented to the  hospital after having significant difficulty with her gait and balance for  the last few weeks. Over the last six months she has had a history of  disequilibrium and neck pain which was far worse whenever she would lie  down. MRI scan was obtained showing a large cystic complex tumor in the  cerebellum along the midline extending to the tentorium causing  hydrocephalus and a Chiari malformation. Recommendation was made for  operative decompression. The patient consented.   OPERATIVE NOTE:  [name] was taken to the operating room, intubated and  placed under general anesthetic without difficulty. Foley catheter was  placed in a sterile conditions, also without difficulty. The patient then  was positioned on a stretcher and her head was shaved and I prepped it with  DuraPrep. I then used 2% lidocaine infiltrated into the right frontal region  of the scalp. Making a hole at the conjunction of the midline of the  pupillary line  anterior to coronal suture, I placed a ventricular catheter  without great difficulty into the, I presumed, the right lateral horn.  I  was able to obtain clear fluid from the catheter. I then tunneled this  subcutaneously approximately 3 to 4 inches posterior to the incision. I was  able placed the catheter be a twist roll bur hole using a hand drill. I  secured the catheter to the scalp, closed the bur hole incision and capped  off the catheter. The patient was then placed into three pin head fixation  after adequate anesthesia was obtained. She was rolled prone onto body rolls  with her head flexed. Then positioned in bed. I then connected the  ventricular catheter to a drainage system and checked it once more to make  sure it did drain. After that I had it turned off. The back of the patient's  head was then shaved and prepped and then she was draped in a sterile  fashion. I opened the skin with a #10 blade and took this down through the  dermis. I  then used monopolar cautery and the cerebellar retractor to divide  the cervical musculature to expose both the occiput and the posterior arch  of C1. With lateral dissection of the cervical musculature I had very good  exposure of the occiput and of C1 along with the foramen magnum. I then used  a high-speed drill to drill down the occiput and create a few bur holes.  Then with a combination of drills and Kerrison punches along with dissection  tools, I performed a craniectomy of the occiput and also a hemilaminectomy  of C1. After gaining adequate bony exposure. I then opened the dura in a Y-  like fashion extending down to C1 to expose the inferior margin of the  cerebellar tonsils. I then tacked the dura laterally using dural sutures. I  was then able to with the bipolar cautery and paddies along with suction,  take down some of the posterior draining veins to the tentorium. The  tentorium was well within view. I then encountered  immediately what was a  very large tumor I believe to be hemangioblastoma. It was quite vascular and  on the surface multiple abnormal vessels appeared. I then with painstaking  dissection over the course of hours, along with Dr. Lucky Rathke assistance,  cauterized the capsule of the tumor. I was able to do this all the way to  the anterior margin and the lateral margins.  This was all done with  microdissection. After doing this, I was then able to encounter thickened  arachnoid anteriorly and I was able to dissect through that and identify and  expose what was a very large vein. This vein did not appear to be intimate  with the tumor, so that was left in place. However, there were two other  arteries at that level which clearly supplied the tumor and they were  cauterized. Multiple vessels were cauterized and divided sharply over the  course of the procedure. After certain amount of cauterization and isolation  of the capsule from the cerebellar tissue, I then opened the small portion  into what I believe was of one of the large cystic regions. There were  multiple cysts within the tumor. Clear fluid then emanated from the tumor  and significant amount of the mass was reduced. After this I was able to  then with Dr. Lucky Rathke assistance, further isolate the capsule from the  cerebellar tissue. And at this point we started to take pieces of the tumor  out in piecemeal fashion using micro scissors and Metzenbaum scissors. With  further removal of tumor and further dissection of the capsule, we were able  to work around the tumor and gain axis to its vascular supply. These vessels  which were all small for the most part, were cauterized and then divided  sharply.  At one point posterior inferiorly, we encountered some bleeding so  at that point the dissection I believe to happen to be inside the tumor. I then regrouped and came back more superficial and was able to identify the  capsule again,  cauterize that, and control of bleeding. The patient then had  the gross total resection of the tumor. I did not split the vermis as there  was a significant amount of relaxation of the cerebellum with removal of  parts of the tumor and with taking down some of the posterior draining veins  to the tentorium.  Hemostasis was obtained using the microscope. I then  placed a dural patch graft since the  patient was a Chiari patient because  the tonsils certainly herniated through the foramen magnum. The ventricular  catheter was able work during the case but I did not need any significant  amount of drainage at that time because I had good relaxation once I had  cerebellum exposed and the cisterna magna opened. I did open through the  foramen magnum again to allow for some postoperative edema and also because  of the tonsil  herniation. The specimen was sent for permanent section to  the pathology lab. After the dural patch graft was placed, I then  reapproximated the cervical musculature and fascia. The skin edges were  reapproximated with a running nylon suture. I decided to keep the patient  asleep as the case and taken approximately nine hours.  Since she had been  under an anesthetic for that long, I did not feel it was worthwhile to have  her extubated immediately after operation. The patient was taken to the ICU  intubated and sedated.      KC/MEDQ  D:  06/10/2004  T:  06/11/2004  Job:  [id number]

## 2010-07-07 NOTE — H&P (Signed)
NAME:  Janet Douglas                 ACCOUNT NO.:  [id number]   MEDICAL RECORD NO.:  [id number]          PATIENT TYPE:  INP   LOCATION:  1824                         FACILITY:  [facility]   PHYSICIAN:  Danae Orleans. Venetia Maxon, M.D.  DATE OF BIRTH:  1965/11/10   DATE OF ADMISSION:  06/28/2004  DATE OF DISCHARGE:                                HISTORY & PHYSICAL   REASON FOR ADMISSION:  Headache and nausea and vomiting.   HISTORY OF PRESENT ILLNESS:  Janet Douglas is a 45 year old woman with  hydrocephalus who was status post resection of posterior fossa meningeal  blastoma with VP shunt placement.  She recently was discharged home and she  complained of vomiting x3 today.  Head CT shows ventricular catheter extends  lateral to the ventricle.  The remainder of the shunt is intact.  The plan  is to admit the patient for VP shunt revision.   PHYSICAL EXAMINATION:  Incisions are clean, dry, and intact.  She has no  fever or belly pain.  She has an otherwise stable examination.  She does  have some fullness of posterior fossa incision, but no drainage from that.  I reviewed this situation with Coletta Memos, M.D. who asked for the patient  to be admitted and he will plan on revising her ventriculoperitoneal shunt  on Jun 29, 2004.      JDS/MEDQ  D:  06/28/2004  T:  06/28/2004  Job:  [id number]

## 2010-07-07 NOTE — H&P (Signed)
NAME:  Janet Douglas, Janet Douglas NO.:  [id number]   MEDICAL RECORD NO.:  [id number]          PATIENT TYPE:  INP   LOCATION:  1832                         FACILITY:  [facility]   PHYSICIAN:  Hewitt Shorts, M.D.DATE OF BIRTH:  [birth year]   DATE OF ADMISSION:  06/07/2004  DATE OF DISCHARGE:                                HISTORY & PHYSICAL   HISTORY OF PRESENT ILLNESS:  The patient is a 45 year old right-handed white  female who was sent by her primary care physician, Dr. Roxy Manns, to the  emergency room for my evaluation of a newly diagnosed brain tumor associated  with obstructive hydrocephalus.   The patient explains that she has had difficulty with dysequilibrium since  October 2005. Because of ongoing dysequilibrium she went to see Dr. Milinda Antis in  December 2005 and was diagnosed with sinusitis and treated. Since that time  she had complaints of neck pain which was attributed to a pinch nerve and  was referred for physical therapy at [facility] in [place]. As far as the  neck presently is concerned, he says it is severe when she lying down and it  can awaken her from sleep. She has no discomfort when she up and about.   The patient has continued to have difficulties, but fell two days ago and  became further concerned, and returned to Dr. Milinda Antis. She finds that she  cannot stand up straight. She was sent home from work for the past two days  because she has not been able to stand up straight. Dr. Milinda Antis went ahead and  referred her for MRI scan of the brain with and without gadolinium that was  done today at [facility]. Dr. 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   The patient denies any headaches. She does describe occasional spots in her  vision which she calls floaters. She has had some difficulty with her  speech for the past week or so.   PAST MEDICAL HISTORY:   Notable for hypothyroidism treated for the past 8  years. No history of hypertension, myocardial infarction, cancer, stroke,  diabetes,  peptic ulcer disease, or lung disease. Her only previous surgery  was a tubal ligation.   MEDICATIONS:  Her only medications are Synthroid 0.075 mg daily, Tylenol  p.r.n. neck pain and Flexeril p.r.n. neck pain.   FAMILY HISTORY:  Father died at age 46 of a myocardial infarction. Her  mother is age 48 with Alzheimer disease (early).   SOCIAL HISTORY:  The patient is married. 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 She had a 64-month-old son. She does not smoke  or drink alcoholic beverages.   REVIEW OF SYSTEMS:  Notable for what is described in history of present  illness and past medical history. She does not describe any nausea or  vomiting.   PHYSICAL EXAMINATION:  GENERAL: The patient is a well-developed, well-  nourished white female.  VITAL SIGNS: Pulse 84, blood pressure 117/80, respiratory rate 16.  LUNGS: Clear to  auscultation. She has symmetrical respiratory excursion.  HEART: Regular rate and rhythm. S1 and S2. There is no murmur.  ABDOMEN: Soft, nondistended. Bowel sounds are present.  NECK: Supple without meningismus.  EXTREMITIES: No clubbing, cyanosis, or edema.  NEUROLOGIC: The patient is awake, alert, fully oriented to speech, mildly  dysarthric, but is fluent. She has good comprehension. Cranial nerves show  bilateral papillary edema. Pupils equal, round, and reactive to light and  about 3 mm bilaterally. Extraocular movements are intact. She has no  nystagmus. Facial sensation is intact. Facial movement is symmetrical, but  speech is mildly dysarthric. Hearing is present bilaterally. Palatal  movement is symmetrical. Shoulder shrug is symmetrical. Tongue is midline.  Motor examination shows 5/5 strength in the upper and lower extremities. She  has no drift of the upper extremities. Sensation is intact  to pinprick  throughout the upper and lower extremities. Reflexes are 1+ in the upper and  lower extremities, and symmetrical. Toes are downgoing bilaterally. Gait and  stance were not tested.   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 The MRI shows evidence of transependymal  absorption. She does have papilledema on examination.   PLAN:  The patient will be admitted to the neurosurgical advanced care unit  (NACU) for further care. She will be started on Decadron intravenously and  Pepcid. I spoke with the patient and a friend who accompanied her about her  condition and recommendations for further evaluation and treatment. Will  need to review her studies further with my partners. I have the opportunity  to discuss this case with Dr. Franky Macho and we will review it further with him  tomorrow. I explained to the patient and her friend that I will be leaving  town as of April 22nd and that a decision is whether to place a  retroperitoneal shunt and plan on bringing her back in two to three weeks  for definitive surgical resection or to proceed with definitive surgical  resection and place an interventricular catheter at the time of surgery,  with a decision as to whether to place a permanent ventriculoperitoneal  shunt at a later time. My concern is to place a ventriculoperitoneal shunt  at this time and proceed on with surgery at a later date. She has had  subsequent surgery with potential for bleeding and other debris to cause a  shunt failure. The best approach may be to proceed with definitive surgical  resection and placement of an interventricular catheter at the time of  surgery.   Final recommendations will be made for the patient tomorrow.      RWN/MEDQ  D:  06/07/2004  T:  06/07/2004  Job:  [id number]

## 2010-07-07 NOTE — Consult Note (Signed)
NAME:  Janet Douglas, Janet Douglas                 ACCOUNT NO.:  [id number]   MEDICAL RECORD NO.:  [id number]          PATIENT TYPE:  INP   LOCATION:  3019                         FACILITY:  [facility]   PHYSICIAN:  Zola Button T. Lazarus Salines, M.D. DATE OF BIRTH:  1965-08-08   DATE OF CONSULTATION:  06/22/2004  DATE OF DISCHARGE:                                   CONSULTATION   REFERRING PHYSICIAN:  Coletta Memos, M.D.   CHIEF COMPLAINT:  Abnormal findings on sinus CT scan.   HISTORY:  [age]-year-old white female hospitalized for surgical  removal of a cerebellar cancer and was noted on CT scan to have some  abnormal sinus findings and ENT was called in consultation.  The patient has  a history of some allergies and nasal congestion/pressure for which she  takes Zyrtec on p.r.n. basis.  No significant history of sinus infections.  No history of nasal polyps or asthma.  No bleeding or colorful drainage from  the nose.  No facial tenderness or vision problems.  No history of surgery  or trauma to the nose.   EXAMINATION:  GENERAL:  This is a pleasant adult white female.  She has some  surgical changes of the head.  Mental status may be very slightly labile,  but basically intact.  She seems to hear well in conversational speech.  Voice is clear and respirations unlabored through the nose.  HEENT/NECK:  The neck is supple.  The ear canals are clear with normal  aerated drums.  Anterior nose shows a slightly corrugated septum with  healthy non-congested membranes.  No polyps or active drainage.  Oral cavity  is clear with teeth in good repair and moist membranes.  Oropharynx clear.  I did not examine nasopharynx or hypopharynx. Neck without adenopathy.   X-RAYS:  I reviewed her CT scans including before her surgery and several  subsequent scans.  She has a large inferiorly base domed mucous retention  cyst of both antra, right larger than left.  The remainder the sinuses are  clear.   IMPRESSION:  Incidental  finding of bilateral maxillary antral mucous  retention cysts, right greater than left.  No significant active sinus  disease.   PLAN:  I discussed this with her.  I think Zyrtec for her seasonal allergies  is not completely fine and appropriate.  I will see her on an as-needed  basis.  She understands and agrees.      KTW/MEDQ  D:  06/22/2004  T:  06/22/2004  Job:  [id number]   cc:   Coletta Memos, M.D.  [address]  [place]  [place] [address]  Fax: [phone]

## 2010-07-07 NOTE — Op Note (Signed)
   NAME:  Janet Douglas, Janet Douglas NO.:  [id number]   MEDICAL RECORD NO.:  [id number]                   PATIENT TYPE:  INP   LOCATION:  9114                                 [facility]   PHYSICIAN:  Huel Cote, M.D.              DATE OF BIRTH:  01/19/1966   DATE OF PROCEDURE:  11/24/2002  DATE OF DISCHARGE:                                 OPERATIVE REPORT   PROCEDURE:  Low forceps vaginal delivery.   The patient is a 45 year old G2, P0-0-1-0 who was admitted in labor and made  slow progress in her active phase.  The patient reached complete dilation at  approximately 12:45 p.m.  However, the vertex remained at a -1 to 0 station.  An IUPC was placed at that point and contractions found to be inadequate.  She was then placed on Pitocin until contractions were maximized and reached  complete dilation with a +1 station at approximately 1545.  She began  pushing at that time and pushed for approximately two hours with a descent  of the fetal vertex to a +2 station, LOA presentation.  Secondary to  maternal exhaustion and her inability to further progress the fetal vertex,  a decision was made after discussion with both parents to proceed with a  vaginal-assisted operative delivery.  The patient then had Luikart forceps  applied to an LOA position, +2 station, and the vertex was easily delivered  in two pulls over a partial third-degree laceration.  There was a nuchal  cord x1 which was reduced over head.  A mild shoulder dystocia was relieved  with McRoberts suprapubic pressure.  There was a slight pop heard after  delivery of the anterior shoulder and for that reason the nursery was  notified to examine the clavicle closely although the baby moved both arms  very well.  Apgars were 8 and 9.  Weight was pending.  The placenta  delivered spontaneously.  Her third-degree laceration was repaired with 2-0  Vicryl and several overlapping sutures of the sphincter.   The remainder was  repaired with 3-0 Vicryl.  The rectum and cervix were intact.  Estimated  blood loss was approximately 450 mL.                                               Huel Cote, M.D.    KR/MEDQ  D:  11/24/2002  T:  11/24/2002  Job:  [id number]

## 2010-07-07 NOTE — Discharge Summary (Signed)
NAME:  Janet Douglas, NEUWIRTH                 ACCOUNT NO.:  [id number]   MEDICAL RECORD NO.:  [id number]          PATIENT TYPE:  INP   LOCATION:  3110                         FACILITY:  [facility]   PHYSICIAN:  Coletta Memos, M.D.     DATE OF BIRTH:  March 09, 1965   DATE OF ADMISSION:  06/28/2004  DATE OF DISCHARGE:  06/30/2004                                 DISCHARGE SUMMARY   ADMITTING DIAGNOSES:  Nausea and obstructive hydrocephalus.   POSTOPERATIVE DIAGNOSES:  Nausea and obstructive hydrocephalus.   PROCEDURE:  Revision of ventricular catheter for VP shunt.   COMPLICATIONS:  None.   DISCHARGE STATUS:  Alive and well.   INDICATIONS:  [name] is a young lady, 45 years of age, who ultrasound a  cerebellar hemangioblastoma resection.  She had ventricular drainage  postoperatively and did well.  The ventric was removed and then she started  to have fluid leak from the posterior aspect of the cervical incision.  She  then had a shunt placed last week on Jun 23, 2004.  She called the doctor on-  call on Wednesday evening, her husband stating that she had had three  episodes of emesis that day.  Upon admission that night the ventricular  catheter which was lateral and on the left side, though it was placed on the  right was in position and the ventricles were smaller so obviously it was  draining.  Secondary to this position and to the fact that I wanted this to  be without problems long-term, I felt that the catheter should be changed.  She was taken to the operating room on Thursday, May 11.  Had the  ventricular catheter changed.  The valve was not and is still set to 100 mL  of water.  Postoperatively the patient has done well.  Postoperative head CT  shows the ventric to be now placed in the right lateral horn just against  the septum lucidum.  Wound is clean, dry, without signs of infection.  At  discharge she is alert and oriented x4.  Neurologic examination significant  only for very mild  dysarthria.      KC/MEDQ  D:  06/30/2004  T:  07/01/2004  Job:  [id number]

## 2010-07-07 NOTE — Discharge Summary (Signed)
NAME:  Janet Douglas, Janet Douglas NO.:  [id number]   MEDICAL RECORD NO.:  [id number]                   PATIENT TYPE:  INP   LOCATION:  9114                                 [facility]   PHYSICIAN:  Huel Cote, M.D.              DATE OF BIRTH:  [birth year]   DATE OF ADMISSION:  11/24/2002  DATE OF DISCHARGE:  11/26/2002                                 DISCHARGE SUMMARY   DISCHARGE DIAGNOSES:  1. Term pregnancy at 39 weeks, delivered.  2. Status post low forceps vaginal delivery.  3. Advanced maternal age.  4. Status post postpartum tubal ligation.   DISCHARGE MEDICATIONS:  1. Motrin 600 mg p.o. q.6h.  2. Percocet one to two tablets p.o. q.4h. p.r.n.  3. Synthroid 0.88 mg p.o. daily.   DISCHARGE FOLLOWUP:  The patient is to follow up in two weeks for an  incision check at her umbilicus and again in six weeks for her full  postpartum examination.   HOSPITAL COURSE:  The patient is a 45 year old G2, P0-0-1-0 who was admitted  at 39+ weeks gestation in labor.  The patient was contracting at home and  came in.  Noted to be 4 cm on admission.  Prenatal care had been complicated  by a breech presentation noted at 36 weeks.  The patient had a successful  ECV performed with the fetus rotated to the vertex presentation on November 09, 2002.  The baby remained vertex at this point.  Other prenatal care only  complications have been advanced maternal age and hypothyroidism.  Her  thyroid remained stable throughout pregnancy on Synthroid.   PRENATAL LABORATORIES:  O+.  Antibody negative.  RPR nonreactive.  Rubella  immune.  Hepatitis B surface antigen negative.  HIV declined.  GC negative.  Chlamydia negative.  Triple screen declined.  One-hour Glucola 121.  Group B  Strep negative.  The last several weeks of pregnancy patient had some  borderline blood pressures.  However, these resolved with rest and she had  no other preeclamptic symptoms or significant  proteinuria.   PAST MEDICAL HISTORY:  Hypothyroidism.   PAST SURGICAL HISTORY:  None.   PAST GYN HISTORY:  History of an abnormal Pap smear in 2002 which resolved  on repeat Pap.   PAST OBSTETRICAL HISTORY:  She had an elective abortion in 1987 and had  secondary infertility and did have some fertility assistance in the past.  On admission the patient had slightly borderline blood pressures 140s/88-90  at the highest.  These returned to normal after she received an epidural for  pain.  She did have preeclamptic laboratories which were normal and had some  proteinuria.  However, the specimen was sent well into labor when the  patient had begun pushing.   PHYSICAL EXAMINATION:  CARDIAC:  Regular rate and rhythm.  LUNGS:  Clear.  ABDOMEN:  Soft and nontender, gravid.  Estimated  fetal weight was 7.5-8  pounds.  PELVIC:  Cervix was 6 cm, completely effaced, and -2 station shortly after  admission and after epidural.   HOSPITAL COURSE:  The patient's progress slowed significantly after her  epidural and she remained 8-9 cm for three to four hours.  At that point an  IUPC was placed and Pitocin begun to optimize her contractions.  Her  contractions were indeed inadequate with IUPC placed and it took a fair  amount of Pitocin to bring them back to adequate.  Eventually, the patient  reached complete dilation and a 0 to +1 station at 1545.  She pushed for  approximately two hours with the vertex progressing to a +2 station, LOA  presentation.  Secondary to maternal exhaustion and little further progress  being made, it was discussed with the patient and husband whether to proceed  with a trial of forceps for vaginal delivery.  They agreed to this attempt  after risks and benefits were discussed in detail.  The patient had a  successful low forceps delivery with Luikart forceps applied at the LOA  position +2 station.  There was a nuchal cord x1 which was reduced over the  head.  She had a  mild shoulder dystocia which was relieved with McRoberts'  and suprapubic pressure.  After the delivery baby moved both arms well.  Apgars were 8 and 9.  Weight was 8 pounds 8 ounces.  She had a third degree  laceration which was repaired with 2-0 and 3-0 Vicryl and expressed a wish  for a postpartum tubal ligation.  She was counseled extensively regarding  the tubal ligation and underwent this on postpartum day #1 without  difficulty.  She did have some mild elevated blood pressures postpartum, the  highest reaching 140/90 and on discharge it was 130/80.  She had a mild  headache.  However, this was not significant and was worse with position  change.  The patient was felt stable for discharge home and was given the  stipulation she call if her headache persisted or worsened or had any other  unusual symptoms.  Her incision was clear on discharge and her lochia  normal.  Therefore, she was discharged home with medications and follow-up  as previously stated.                                               Huel Cote, M.D.    KR/MEDQ  D:  11/26/2002  T:  11/26/2002  Job:  [id number]

## 2010-07-07 NOTE — Op Note (Signed)
NAME:  Janet Douglas, Janet Douglas NO.:  [id number]   MEDICAL RECORD NO.:  [id number]                   PATIENT TYPE:  INP   LOCATION:  9114                                 FACILITY[facility]   PHYSICIAN:  Huel Cote, M.D.              DATE OF BIRTH:  04-Jan-1966   DATE OF PROCEDURE:  11/25/2002  DATE OF DISCHARGE:                                 OPERATIVE REPORT   PREOPERATIVE DIAGNOSES:  1. Status post low forceps vaginal delivery.  2. Desires permanent sterility.   POSTOPERATIVE DIAGNOSES:  1. Status post low forceps vaginal delivery.  2. Desires permanent sterility.  3. Mild to moderate distal tubal scarring noted at the time of surgery.   PROCEDURE:  Bilateral tubal ligation.   SURGEON:  Huel Cote, M.D.   ANESTHESIA:  Epidural.   FINDINGS:  There was a normal uterus and ovaries.  The tubes were noted to  have distal scarring at their fimbriated end.   DESCRIPTION OF PROCEDURE:  The patient was taken to the operating room,  where epidural anesthesia was found to be adequate by Allis clamp test.  She  was then prepped and draped in the dorsal supine position.  Marcaine 0.25%  solution was injected infraumbilically and a 2 cm infraumbilical incision  made with a scalpel.  This was carried down through the underlying layer of  fascia and the peritoneal cavity itself identified and entered sharply with  Metzenbaum scissors.  The [employer] retractors were placed within the  incision and the right fallopian tube identified, traced out to its  fimbriated end, and tented up in an approximately 2-3 cm buckle of tube.  This was tied off with two ties of 0 plain and amputated.  The remaining  pedicle was carefully inspected and found to be hemostatic and was  additionally Bovie cauterized.  In a similar fashion the left fallopian tube  was identified, grasped with a Babcock clamp, elevated, and a 2-3 cm buckle  of tube tied off with 0 plain.  This was  then amputated and handed off to  pathology and the pedicle was inspected and found to be hemostatic and  treated with Bovie cautery as well.  Both tubes were removed to their normal  anatomical position and the fascia was identified and closed with 0 Vicryl  in a running fashion.  Skin was closed with 3-0 Vicryl in a subcuticular  stitch.  Sponge, lap, and needle counts were correct x2 and the patient was  taken to the recovery room in stable condition.                                                Huel Cote, M.D.    KR/MEDQ  D:  11/25/2002  T:  11/25/2002  Job:  [id number]

## 2010-07-07 NOTE — Op Note (Signed)
NAME:  Janet Douglas, Janet Douglas                 ACCOUNT NO.:  [id number]   MEDICAL RECORD NO.:  [id number]          PATIENT TYPE:  INP   LOCATION:  3110                         FACILITY:  [facility]   PHYSICIAN:  Coletta Memos, M.D.     DATE OF BIRTH:  07-05-1965   DATE OF PROCEDURE:  06/29/2004  DATE OF DISCHARGE:                                 OPERATIVE REPORT   PREOPERATIVE DIAGNOSES:  1.  Hydrocephalus.  2.  Possible shunt malfunction.   POSTOPERATIVE DIAGNOSES:  1.  Hydrocephalus.  2.  Possible shunt malfunction.   PROCEDURE:  Ventricular catheter revision, ventriculoperitoneal shunt  revision.   INDICATIONS:  [name] is a 45 year old who had a shunt placed 1 week  ago.  The shunt had spontaneous flow at that period of time.  Her husband  called yesterday on Jun 28, 2004 stating that she had had 3 episodes of  emesis.  CT showed that the ventricles were well-decompressed and were  smaller than they had been prior to the shunt being placed last week on  Thursday and the shunt series showed that the shunt was in continuity.  Sensor catheter was not well-positioned.  I felt I would go ahead and  replace the ventricular catheter and this showed that she was continuing to  have good drainage.  She agreed and consented to the procedure.   OPERATIVE NOTE:  [name] was brought to the operating room, intubated  and placed under a general anesthetic.  Head was prepped and she was draped  in a sterile fashion.  I opened the old incision in the cranium, revealing  the shunt catheter and shunt valve.  I then pulled the catheter out of the  brain and removed it.  I wrapped shunt end in a saline-soaked gauze sponge.  I then replaced the ventricular catheter, placing it approximately 5 to 5.5  cm into the brain and I had spontaneous flow of CSF.  I connected it back to  the rest of the shunt.  I then tied it with silk tie, securing it to the  Rickham reservoir.  I then aspirated the Rickham to  ensure that I had good  flow from the catheter and I did.  I then closed the wound in layered  fashion using Vicryl sutures and closed the skin with a 3-0 nylon running  suture.  A sterile dressing was applied.  The patient tolerated procedure  well.  She was extubated and brought to the recovery room in a good state.  Preoperatively, she had been placed under a general anesthetic and intubated  without difficulty.      KC/MEDQ  D:  06/29/2004  T:  06/30/2004  Job:  [id number]

## 2010-10-13 ENCOUNTER — Telehealth: Payer: Self-pay | Admitting: *Deleted

## 2010-10-13 NOTE — Telephone Encounter (Signed)
Opened in error

## 2010-10-18 ENCOUNTER — Other Ambulatory Visit: Payer: Federal, State, Local not specified - PPO

## 2010-10-18 ENCOUNTER — Ambulatory Visit (INDEPENDENT_AMBULATORY_CARE_PROVIDER_SITE_OTHER): Payer: Federal, State, Local not specified - PPO | Admitting: Family Medicine

## 2010-10-18 ENCOUNTER — Encounter: Payer: Self-pay | Admitting: Family Medicine

## 2010-10-18 DIAGNOSIS — M722 Plantar fascial fibromatosis: Secondary | ICD-10-CM

## 2010-10-18 DIAGNOSIS — E039 Hypothyroidism, unspecified: Secondary | ICD-10-CM

## 2010-10-18 DIAGNOSIS — R635 Abnormal weight gain: Secondary | ICD-10-CM

## 2010-10-18 DIAGNOSIS — R5383 Other fatigue: Secondary | ICD-10-CM | POA: Insufficient documentation

## 2010-10-18 LAB — CBC WITH DIFFERENTIAL/PLATELET
Basophils Absolute: 0 10*3/uL (ref 0.0–0.1)
Eosinophils Absolute: 0.1 10*3/uL (ref 0.0–0.7)
Hemoglobin: 10.9 g/dL — ABNORMAL LOW (ref 12.0–15.0)
Lymphocytes Relative: 20.8 % (ref 12.0–46.0)
MCHC: 31.1 g/dL (ref 30.0–36.0)
Monocytes Absolute: 0.5 10*3/uL (ref 0.1–1.0)
Neutro Abs: 5.5 10*3/uL (ref 1.4–7.7)
RDW: 14.6 % (ref 11.5–14.6)

## 2010-10-18 LAB — COMPREHENSIVE METABOLIC PANEL
ALT: 18 U/L (ref 0–35)
BUN: 17 mg/dL (ref 6–23)
CO2: 29 mEq/L (ref 19–32)
Calcium: 9.1 mg/dL (ref 8.4–10.5)
Chloride: 104 mEq/L (ref 96–112)
Creatinine, Ser: 0.8 mg/dL (ref 0.4–1.2)
GFR: 78.81 mL/min (ref >60.00)
Glucose, Bld: 98 mg/dL (ref 70–99)

## 2010-10-18 LAB — T4, FREE: Free T4: 0.91 ng/dL (ref 0.60–1.60)

## 2010-10-18 LAB — TSH: TSH: 1.95 u[IU]/mL (ref 0.35–5.50)

## 2010-10-18 NOTE — Assessment & Plan Note (Signed)
Labs today More fatigued after changing generic med Will adv and change dose if necessary

## 2010-10-18 NOTE — Progress Notes (Signed)
Subjective:    Patient ID: Janet Douglas, female    DOB: May 06, 1965, 45 y.o.   MRN: [id number]  HPI Here for fatigue and wt gain   Is tired all the time  She had a switch to generic thyroid med 1 mo ago  Would like to have her tsh checked  Saves a lot by doing generic     Has depression on zoloft -- that is much better overall   L foot pain -- ? Is her plantar fasciitis  Has insoles in shoes- from years ago -- ? Need new ones  Limits her exercise- is trying to walk more in the mornings Very tender under base of her L first toe  Needs to have some restrictions for work   [employer] is up 2 lb from last visit  Feels like more than that  Did eat poorly on vacation - getting back to normal now  No exercise currently   Patient Active Problem List  Diagnoses  . DERMATOFIBROMA  . HYPOTHYROIDISM  . OBESITY  . ANEMIA-NOS  . NONORGANIC SLEEP DISORDER UNSPECIFIED  . DEPRESSION, SITUATIONAL  . POOR CONCENTRATION  . PRURITUS  . HIP PAIN, RIGHT  . NECK PAIN  . METATARSALGIA  . OTHER ENTHESOPATHY OF ANKLE AND TARSUS  . PLANTAR FASCIITIS  . SESAMOIDITIS  . TRANSAMINASES, SERUM, ELEVATED  . ABNORMAL PAP SMEAR  . ELEVATED BLOOD PRESSURE WITHOUT DIAGNOSIS OF HYPERTENSION  . Allergic rhinitis  . Fatigue  . Weight gain   Past Medical History  Diagnosis Date  . Anemia     NOS  . Hypothyroidism   . Brain tumor     S/P resection with shunt   Past Surgical History  Procedure Date  . Abdominal US 05/19/1999    Negative  . Tubal ligation   . 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 Abdominal US 08/10    Normal   History  Substance Use Topics  . Smoking status: Never Smoker   . Smokeless tobacco: Not on file  . Alcohol Use: Yes     Minimal   Family History  Problem Relation Age of Onset  . Thyroid disease Mother   . Thyroid disease Sister   . Heart disease Sister     Low heart rate  . Thyroid disease Brother   . Thyroid disease Brother    Allergies  Allergen  Reactions  . Bupropion Hcl     REACTION: itching   Current Outpatient Prescriptions on File Prior to Visit  Medication Sig Dispense Refill  . diphenhydramine-acetaminophen (TYLENOL PM) 25-500 MG TABS Take 1 tablet by mouth at bedtime as needed.        . MULTIPLE VITAMINS PO Take by mouth daily.        . sertraline (ZOLOFT) 100 MG tablet Take 100 mg by mouth daily.        Marland Kitchen amoxicillin (AMOXIL) 500 MG tablet Take 4 tablets by mouth one hour before dental procedure.       . loratadine (CLARITIN) 10 MG tablet Take 10 mg by mouth daily.        Marland Kitchen SYNTHROID 75 MCG tablet TAKE 1 TABLET BY MOUTH EVERY DAY  30 tablet  11      Review of Systems Review of Systems  Constitutional: Negative for fever, appetite change  Eyes: Negative for pain and visual disturbance.  Respiratory: Negative for cough and shortness of breath.   Cardiovascular: Negative.  for cp or palpitations  Gastrointestinal: Negative  for nausea, diarrhea and constipation.  Genitourinary: Negative for urgency and frequency.  Skin: Negative for pallor. or rash  MSK pos for foot pain  Neurological: Negative for weakness, light-headedness, numbness and headaches.  Hematological: Negative for adenopathy. Does not bruise/bleed easily.  Psychiatric/Behavioral: Negative for dysphoric mood. The patient is not nervous/anxious.          Objective:   Physical Exam  Constitutional: She appears well-developed and well-nourished. No distress.       overwt and well appearing   HENT:  Head: Normocephalic and atraumatic.  Right Ear: External ear normal.  Left Ear: External ear normal.  Nose: Nose normal.  Mouth/Throat: Oropharynx is clear and moist.  Eyes: Conjunctivae and EOM are normal. Pupils are equal, round, and reactive to light.  Neck: Normal range of motion. Neck supple. No JVD present. Carotid bruit is not present. No thyromegaly present.  Cardiovascular: Normal rate, regular rhythm, normal heart sounds and intact distal  pulses.   Pulmonary/Chest: Effort normal and breath sounds normal. No respiratory distress. She has no wheezes.  Abdominal: Soft. Bowel sounds are normal.  Musculoskeletal: Normal range of motion. She exhibits tenderness. She exhibits no edema.       Tender under first metatarsal heads of both feet No heel tenderness Feet are well perfused , nl rom ankles  No swelling   Lymphadenopathy:    She has no cervical adenopathy.  Neurological: She is alert. She has normal reflexes. Coordination normal.  Skin: Skin is warm and dry. No rash noted. No erythema. No pallor.  Psychiatric: She has a normal mood and affect.       Cheerful today          Assessment & Plan:

## 2010-10-18 NOTE — Assessment & Plan Note (Signed)
Without worsened depression or other symptoms besides small wt gain  Could be from lack of exercise Pt states she thinks it is from changing generic thyroid med Will do lab today and update Good attitude and mood which is reasssuring Rev diet and exercise recommendations

## 2010-10-18 NOTE — Assessment & Plan Note (Signed)
Pt has more L sided foot pain under first toe  Hx of fasciitis and sesamoid issues and metatarsalgia in past  Thinks her orthotics are worn out  Note for work- light duty Ref to Dr [employer] for f/u of this

## 2010-10-18 NOTE — Patient Instructions (Signed)
Labs today for fatigue/ thyroid  Please make appt with Dr Patsy Lager for foot pain at check out  Stay on light duty until your appt with him  Try to get regular low impact exercise

## 2010-10-19 ENCOUNTER — Encounter: Payer: Self-pay | Admitting: Family Medicine

## 2010-10-30 ENCOUNTER — Encounter: Payer: Self-pay | Admitting: Family Medicine

## 2010-10-30 ENCOUNTER — Ambulatory Visit (INDEPENDENT_AMBULATORY_CARE_PROVIDER_SITE_OTHER): Payer: Federal, State, Local not specified - PPO | Admitting: Family Medicine

## 2010-10-30 VITALS — BP 110/76 | HR 74 | Temp 98.2°F | Ht 66.0 in | Wt 199.8 lb

## 2010-10-30 DIAGNOSIS — M7750 Other enthesopathy of unspecified foot: Secondary | ICD-10-CM

## 2010-10-30 DIAGNOSIS — Z23 Encounter for immunization: Secondary | ICD-10-CM

## 2010-10-30 NOTE — Progress Notes (Signed)
  Subjective:    Patient ID: Janet Douglas, female    DOB: [birth year], 45 y.o.   MRN: [id number]  HPI  [name], a 45 y.o. female presents today in the office for the following:    Dr. Milinda Antis requested an evaluation for foot pain:  Feet hurt, like a tingly feeling. Have been hurting for the last couple of weeks. In and out of the truck more. Works for the [employer]. Getting in and out of the truck more.   Remember her well, made orthotics in 05/2008. With L MT pad.  PF symptoms are resolved, now symptoms all in forefoot.   More around areas between 3-4 and 4-5 MT heads, but also some irritation at 1st MT head  Review of Systems REVIEW OF SYSTEMS  GEN: No fevers, chills. Nontoxic. Primarily MSK c/o today. MSK: Detailed in the HPI GI: tolerating PO intake without difficulty Neuro: No numbness, parasthesias, or tingling associated. Otherwise the pertinent positives of the ROS are noted above.      Objective:   Physical Exam   Physical Exam  Blood pressure 110/76, pulse 74, temperature 98.2 F (36.8 C), temperature source Oral, height 5\' 6"  (1.676 m), weight 199 lb 12.8 oz (90.629 kg), SpO2 98.00%.  GEN: Well-developed,well-nourished,in no acute distress; alert,appropriate and cooperative throughout examination HEENT: Normocephalic and atraumatic without obvious abnormalities. Ears, externally no deformities PULM: Breathing comfortably in no respiratory distress EXT: No clubbing, cyanosis, or edema PSYCH: Normally interactive. Cooperative during the interview. Pleasant. Friendly and conversant. Not anxious or depressed appearing. Normal, full affect.  FEET: Echymosis: no Edema: no ROM: full LE B Gait: heel toe, non-antalgic MT pain: no Callus pattern: none Lateral Mall: NT Medial Mall: NT Talus: NT Navicular: NT Cuboid: NT Calcaneous: NT Metatarsals: TTP 3-4 and 4-5 between heads, mildly at plantar aspect of 1st 5th MT: NT Phalanges: NT Achilles: NT Plantar  Fascia: NT Fat Pad: NT Peroneals: NT Post Tib: NT Great Toe: Nml motion Ant Drawer: neg ATFL: NT CFL: NT Deltoid: NT Other foot breakdown: none Long arch: preserved Transverse arch: dropped mt heads diffusely Hindfoot breakdown: none Sensation: intact       Assessment & Plan:   1. Flu vaccine need  Flu vaccine greater than or equal to 3yo with preservative IM  2. METATARSALGIA      Likely irritation from increased activity. Orthotics in good shape.  Placed B metatarsal bars with poron for added cushion. Note for work for more supportive shoes, non-postal.

## 2010-11-16 ENCOUNTER — Other Ambulatory Visit: Payer: Self-pay | Admitting: Family Medicine

## 2010-11-17 ENCOUNTER — Other Ambulatory Visit: Payer: Self-pay | Admitting: *Deleted

## 2010-11-17 MED ORDER — AMOXICILLIN 500 MG PO TABS: ORAL_TABLET | ORAL | Status: DC

## 2010-11-17 NOTE — Telephone Encounter (Signed)
Ok to refill 

## 2010-11-17 NOTE — Telephone Encounter (Signed)
?   I just did this earlier today

## 2010-11-17 NOTE — Telephone Encounter (Signed)
Will refill electronically to her pharmacy Let me know if that is not the correct pharmacy

## 2010-11-21 ENCOUNTER — Ambulatory Visit (INDEPENDENT_AMBULATORY_CARE_PROVIDER_SITE_OTHER): Payer: Federal, State, Local not specified - PPO | Admitting: Family Medicine

## 2010-11-21 ENCOUNTER — Encounter: Payer: Self-pay | Admitting: Family Medicine

## 2010-11-21 VITALS — BP 108/78 | HR 64 | Temp 98.0°F | Ht 66.0 in | Wt 198.5 lb

## 2010-11-21 DIAGNOSIS — R5383 Other fatigue: Secondary | ICD-10-CM

## 2010-11-21 DIAGNOSIS — E039 Hypothyroidism, unspecified: Secondary | ICD-10-CM

## 2010-11-21 DIAGNOSIS — M25559 Pain in unspecified hip: Secondary | ICD-10-CM

## 2010-11-21 DIAGNOSIS — E669 Obesity, unspecified: Secondary | ICD-10-CM

## 2010-11-21 DIAGNOSIS — D649 Anemia, unspecified: Secondary | ICD-10-CM

## 2010-11-21 LAB — CBC WITH DIFFERENTIAL/PLATELET
Eosinophils Relative: 1.1 % (ref 0.0–5.0)
MCV: 67.6 fl — ABNORMAL LOW (ref 78.0–100.0)
Monocytes Absolute: 0.5 10*3/uL (ref 0.1–1.0)
Neutrophils Relative %: 73 % (ref 43.0–77.0)
Platelets: 185 10*3/uL (ref 150.0–400.0)
WBC: 7.9 10*3/uL (ref 4.5–10.5)

## 2010-11-21 LAB — FERRITIN: Ferritin: 68.1 ng/mL (ref 10.0–291.0)

## 2010-11-21 MED ORDER — FERROUS SULFATE 325 (65 FE) MG PO TABS: 325.0000 mg | ORAL_TABLET | Freq: Two times a day (BID) | ORAL | Status: DC

## 2010-11-21 NOTE — Progress Notes (Signed)
Subjective:    Patient ID: Janet Douglas, female    DOB: 27-Oct-1965, 45 y.o.   MRN: [id number]  HPI Here for f/u of anemia and fatigue and hypothyroidism  Labs last visit for fatigue showed anemia -- with hb of 10.9 Is supposed to take it bid - sometimes forgets to take the 2nd dose  Is feeling better overall  Has a hx of anemia  ? If possible thalasemia  Some heavy menses No blood in stool   Found out she can pay with benefits card for med if px - so needs a px for it   tsh normal - that is reassuring and will continue current dose No change in skin or hair Still overwt and this bothers her   Feet are feeling better from orthotics  Trying to exercise to get her wt down  Also hip pain   Wants to loose weight  Needs to stop eating junk food - and also eats emotionally at work (which is stressful)  Needs to start packing lunch - is an effort Does eat when upset or stressed  Sometimes eats too much at night Needs to exercise , but hip hurts and is tired at end of the day Has been gaining wt with age   Patient Active Problem List  Diagnoses  . DERMATOFIBROMA  . HYPOTHYROIDISM  . OBESITY  . ANEMIA-NOS  . NONORGANIC SLEEP DISORDER UNSPECIFIED  . DEPRESSION, SITUATIONAL  . POOR CONCENTRATION  . PRURITUS  . HIP PAIN, RIGHT  . NECK PAIN  . METATARSALGIA  . OTHER ENTHESOPATHY OF ANKLE AND TARSUS  . PLANTAR FASCIITIS  . SESAMOIDITIS  . TRANSAMINASES, SERUM, ELEVATED  . ABNORMAL PAP SMEAR  . ELEVATED BLOOD PRESSURE WITHOUT DIAGNOSIS OF HYPERTENSION  . Allergic rhinitis  . Fatigue  . Weight gain   Past Medical History  Diagnosis Date  . Anemia     NOS  . Hypothyroidism   . Brain tumor     S/P resection with shunt   Past Surgical History  Procedure Date  . Abdominal US 05/19/1999    Negative  . Tubal ligation   . 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 Abdominal US 08/10    Normal   History  Substance Use Topics  . Smoking status: Never Smoker     . Smokeless tobacco: Not on file  . Alcohol Use: Yes     Minimal   Family History  Problem Relation Age of Onset  . Thyroid disease Mother   . Thyroid disease Sister   . Heart disease Sister     Low heart rate  . Thyroid disease Brother   . Thyroid disease Brother    Allergies  Allergen Reactions  . Bupropion Hcl     REACTION: itching   Current Outpatient Prescriptions on File Prior to Visit  Medication Sig Dispense Refill  . diphenhydramine-acetaminophen (TYLENOL PM) 25-500 MG TABS Take 1 tablet by mouth at bedtime as needed.        . MULTIPLE VITAMINS PO Take by mouth daily.        . sertraline (ZOLOFT) 100 MG tablet Take 100 mg by mouth daily.        Marland Kitchen SYNTHROID 75 MCG tablet TAKE 1 TABLET BY MOUTH EVERY DAY  30 tablet  11  . amoxicillin (AMOXIL) 500 MG tablet Take 4 tablets by mouth one hour before dental procedure.  4 tablet  3  . loratadine (CLARITIN) 10 MG tablet Take 10 mg  by mouth daily.               Review of Systems Review of Systems  Constitutional: Negative for fever, appetite change,  and unexpected weight change. Pos for fatigue that is improved  Eyes: Negative for pain and visual disturbance.  Respiratory: Negative for cough and shortness of breath.   Cardiovascular: Negative for cp or palpitations    Gastrointestinal: Negative for nausea, diarrhea and constipation. neg for blood in stool or abd pain  Genitourinary: Negative for urgency and frequency.  Skin: Negative for pallor or rash   Neurological: Negative for weakness, light-headedness, numbness and headaches.  Hematological: Negative for adenopathy. Does not bruise/bleed easily.  Psychiatric/Behavioral: Negative for dysphoric mood. The patient is not nervous/anxious.  she does stay generally stressed and eats for this        Objective:   Physical Exam  Constitutional: She appears well-developed and well-nourished. No distress.       overwt and well appearing   HENT:  Head: Normocephalic and  atraumatic.  Mouth/Throat: Oropharynx is clear and moist.  Eyes: Conjunctivae and EOM are normal. Pupils are equal, round, and reactive to light. No scleral icterus.  Neck: Normal range of motion. Neck supple. No JVD present. Carotid bruit is not present. No thyromegaly present.  Cardiovascular: Normal rate, regular rhythm, normal heart sounds and intact distal pulses.  Exam reveals no gallop.   Pulmonary/Chest: Effort normal and breath sounds normal. No respiratory distress. She has no wheezes. She exhibits no tenderness.  Abdominal: Soft. Bowel sounds are normal. She exhibits no distension, no abdominal bruit and no mass. There is no tenderness.  Musculoskeletal: Normal range of motion. She exhibits no edema and no tenderness.  Lymphadenopathy:    She has no cervical adenopathy.  Neurological: She is alert. She has normal reflexes. No cranial nerve deficit. Coordination normal.       No tremor  Skin: Skin is warm and dry. No rash noted. No erythema. No pallor.       No conj pallor or pallor of palms  Psychiatric: She has a normal mood and affect.       Nl affect but seems stressed in general          Assessment & Plan:

## 2010-11-21 NOTE — Assessment & Plan Note (Addendum)
Per pt she and whole family have always been mildly anemic (she thinks it has to do with Mediterranean descent)- ? Poss thalassemia  Also heavy menses  tx with iron and so far good clinical response - with imp of fatigue  Lab today for cbc and also ferritin  Then make plan  Px sent to pharmacy for ferrous sulfate bid

## 2010-11-21 NOTE — Assessment & Plan Note (Signed)
Had a disc about this  Pt will need both healthier diet and exercise to loose  Currently feet feel better- which is good , but hip hurts (she plans to see Dr Patsy Lager about that )  Disc diet- pt is emotional eater and eats junk food and fast food  I think she will need to both change what she is eating and also cut portions  Disc wt watchers system Offered counseling for emotional eating - pt declined at this time

## 2010-11-21 NOTE — Assessment & Plan Note (Signed)
Looked at last labs tsh is theraputic and pt is feeling better Will continue current dose

## 2010-11-21 NOTE — Assessment & Plan Note (Signed)
Possible tendonitis /bursitis from getting out of truck  F/u Dr Patsy Lager if not improved

## 2010-11-21 NOTE — Assessment & Plan Note (Signed)
Improved with tx of anemia  Labs today and continue iron  Rev last labs with pt in detail

## 2010-11-21 NOTE — Patient Instructions (Signed)
Continue iron for now  I sent px to your pharmacy  I'm glad you are feeling better Checking cbc today  Will update you  = to see if iron is working  Work on [employer], theatre/television/film  Work on exercise See Dr Patsy Lager for hip if it continues to bother you

## 2010-12-07 ENCOUNTER — Encounter: Payer: Self-pay | Admitting: Family Medicine

## 2010-12-07 ENCOUNTER — Ambulatory Visit (INDEPENDENT_AMBULATORY_CARE_PROVIDER_SITE_OTHER): Payer: Federal, State, Local not specified - PPO | Admitting: Family Medicine

## 2010-12-07 VITALS — BP 110/78 | HR 76 | Temp 98.4°F | Wt 201.2 lb

## 2010-12-07 DIAGNOSIS — J029 Acute pharyngitis, unspecified: Secondary | ICD-10-CM

## 2010-12-07 DIAGNOSIS — J040 Acute laryngitis: Secondary | ICD-10-CM | POA: Insufficient documentation

## 2010-12-07 LAB — POCT RAPID STREP A (OFFICE): Rapid Strep A Screen: NEGATIVE

## 2010-12-07 NOTE — Patient Instructions (Signed)
Sounds like you have a viral upper respiratory infection with laryngitis component (hoarse voice). Antibiotics are not needed for this.  Viral infections usually take 7-10 days to resolve.  The cough can last several weeks to go away. Push fluids with vitamin C and plenty of rest. May try throat lozenges, honey.  Simple mucinex with plenty of water to break up mucous alleve for throat inflammation. Please notifu [place] if you are not improving as expected, or if you have high fevers (>101.5) or difficulty swallowing or worsening productive cough. Call clinic with questions.  Good to see you today.

## 2010-12-07 NOTE — Progress Notes (Signed)
  Subjective:    Patient ID: Janet Douglas, female    DOB: Sep 14, 1965, 45 y.o.   MRN: [id number]  HPI CC: ST  1d h/o ST - last night trouble sleeping and swallowing.  Today soreness better but still hoarse.  + cough that started today.  Feels swelling in throat as well.  Still with difficulty swallowing.  Honey has helped.  Cough is wet sounding, some productive of sputum.  No fevers/chills, abd pain, n/v, head congestion, RN, HA, ear pain or tooth pain.  No trouble opening/closing mouth.  H/o brain cancer s/p resection and shunt.  No sick contacts at home, no smokers at home.  No h/o asthma.  Does have seasonal allergies.  Review of Systems Per HPI    Objective:   Physical Exam  Nursing note and vitals reviewed. Constitutional: She appears well-developed and well-nourished. No distress.       Hoarse voice  HENT:  Head: Normocephalic and atraumatic.  Right Ear: Hearing, tympanic membrane, external ear and ear canal normal.  Left Ear: Hearing, tympanic membrane, external ear and ear canal normal.  Nose: No mucosal edema or rhinorrhea. Right sinus exhibits no maxillary sinus tenderness and no frontal sinus tenderness. Left sinus exhibits no maxillary sinus tenderness and no frontal sinus tenderness.  Mouth/Throat: Uvula is midline and mucous membranes are normal. Posterior oropharyngeal erythema present. No oropharyngeal exudate, posterior oropharyngeal edema or tonsillar abscesses.  Eyes: Conjunctivae and EOM are normal. Pupils are equal, round, and reactive to light. No scleral icterus.  Neck: Normal range of motion. Neck supple. No JVD present. No thyromegaly present.  Cardiovascular: Normal rate, regular rhythm, normal heart sounds and intact distal pulses.   No murmur heard. Pulmonary/Chest: Effort normal and breath sounds normal. No respiratory distress. She has no wheezes. She has no rales.  Lymphadenopathy:       Head (right side): No submental, no submandibular, no tonsillar, no  preauricular and no posterior auricular adenopathy present.       Head (left side): No submental, no submandibular, no tonsillar, no preauricular and no posterior auricular adenopathy present.    She has no cervical adenopathy.       Right: No supraclavicular adenopathy present.       Left: No supraclavicular adenopathy present.  Skin: Skin is warm and dry. No rash noted.      Assessment & Plan:

## 2010-12-07 NOTE — Assessment & Plan Note (Signed)
RST neg. Viral laryngitis with developing bronchitis component. Supportive care.  Update [place] if not improving as expected.

## 2011-02-23 ENCOUNTER — Other Ambulatory Visit: Payer: Self-pay | Admitting: Obstetrics and Gynecology

## 2011-02-23 DIAGNOSIS — Z1231 Encounter for screening mammogram for malignant neoplasm of breast: Secondary | ICD-10-CM

## 2011-03-19 ENCOUNTER — Ambulatory Visit
Admission: RE | Admit: 2011-03-19 | Discharge: 2011-03-19 | Disposition: A | Discharge status: Home / Self Care | Payer: Federal, State, Local not specified - PPO | Source: Ambulatory Visit | Attending: Obstetrics and Gynecology | Admitting: Obstetrics and Gynecology

## 2011-03-19 DIAGNOSIS — Z1231 Encounter for screening mammogram for malignant neoplasm of breast: Secondary | ICD-10-CM

## 2011-06-05 ENCOUNTER — Telehealth: Payer: Self-pay

## 2011-06-05 ENCOUNTER — Ambulatory Visit (INDEPENDENT_AMBULATORY_CARE_PROVIDER_SITE_OTHER): Payer: Federal, State, Local not specified - PPO | Admitting: Family Medicine

## 2011-06-05 ENCOUNTER — Encounter: Payer: Self-pay | Admitting: Family Medicine

## 2011-06-05 ENCOUNTER — Ambulatory Visit (INDEPENDENT_AMBULATORY_CARE_PROVIDER_SITE_OTHER)
Admission: RE | Admit: 2011-06-05 | Discharge: 2011-06-05 | Disposition: A | Discharge status: Home / Self Care | Payer: Federal, State, Local not specified - PPO | Source: Ambulatory Visit | Attending: Family Medicine | Admitting: Family Medicine

## 2011-06-05 VITALS — BP 110/70 | HR 77 | Temp 98.1°F | Ht 66.0 in | Wt 211.0 lb

## 2011-06-05 DIAGNOSIS — J208 Acute bronchitis due to other specified organisms: Secondary | ICD-10-CM | POA: Insufficient documentation

## 2011-06-05 DIAGNOSIS — M25562 Pain in left knee: Secondary | ICD-10-CM

## 2011-06-05 DIAGNOSIS — M25569 Pain in unspecified knee: Secondary | ICD-10-CM

## 2011-06-05 DIAGNOSIS — E669 Obesity, unspecified: Secondary | ICD-10-CM

## 2011-06-05 DIAGNOSIS — J209 Acute bronchitis, unspecified: Secondary | ICD-10-CM

## 2011-06-05 MED ORDER — BENZONATATE 200 MG PO CAPS: 200.0000 mg | ORAL_CAPSULE | Freq: Three times a day (TID) | ORAL | Status: AC | PRN

## 2011-06-05 MED ORDER — ALBUTEROL SULFATE HFA 108 (90 BASE) MCG/ACT IN AERS: 2.0000 | INHALATION_SPRAY | RESPIRATORY_TRACT | Status: DC | PRN

## 2011-06-05 MED ORDER — GUAIFENESIN-CODEINE 100-10 MG/5ML PO SYRP: 5.0000 mL | ORAL_SOLUTION | Freq: Every evening | ORAL | Status: AC | PRN

## 2011-06-05 NOTE — Telephone Encounter (Signed)
Patient advised as instructed via telephone. 

## 2011-06-05 NOTE — Telephone Encounter (Signed)
Pt seen today and when pt went to [facility] [address] there was no inhaler prescribed. Pt would like inhaler Dr Milinda Antis was recommending in discharge note to be sent to [facility] [address] and pt can be reached at [phone].

## 2011-06-05 NOTE — Progress Notes (Signed)
Subjective:    Patient ID: Janet Douglas, female    DOB: 07-Mar-1965, 46 y.o.   MRN: [id number]  HPI Here for knee pain and allergies  Allergies - is coughing a lot  Tried zyrtec for a week and then allegra Now doubts it is allergies  Now suspects bronchitis Harsh barky cough- 1.5 weeks - once in a while is prod of mucous/ green  Runny nose/ sneezing  Has a headache (took 2 aleve yesterday )- sinus pain and pressure No fever  Then took nyquil for 2 nights-which helped   Left knee hurts  ? 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 Aff her joints  Is trying to cut portions   Patient Active Problem List  Diagnoses  . DERMATOFIBROMA  . HYPOTHYROIDISM  . OBESITY  . ANEMIA-NOS  . NONORGANIC SLEEP DISORDER UNSPECIFIED  . DEPRESSION, SITUATIONAL  . POOR CONCENTRATION  . PRURITUS  . HIP PAIN, RIGHT  . NECK PAIN  . METATARSALGIA  . OTHER ENTHESOPATHY OF ANKLE AND TARSUS  . PLANTAR FASCIITIS  . SESAMOIDITIS  . TRANSAMINASES, SERUM, ELEVATED  . ABNORMAL PAP SMEAR  . ELEVATED BLOOD PRESSURE WITHOUT DIAGNOSIS OF HYPERTENSION  . Allergic rhinitis  . Fatigue  . Weight gain  . Laryngitis acute  . Knee pain  . Acute bronchitis, viral  . Obesity  . Left knee pain   Past Medical History  Diagnosis Date  . Anemia     NOS  . Hypothyroidism   . Brain tumor     S/P resection with shunt   Past Surgical History  Procedure Date  . Abdominal US 05/19/1999    Negative  . Tubal ligation   . 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 Abdominal US 08/10    Normal   History  Substance Use Topics  . Smoking status: Never Smoker   . Smokeless tobacco: Not on file  . Alcohol Use: Yes   Minimal   Family History  Problem Relation Age of Onset  . Thyroid disease Mother   . Thyroid disease Sister   . Heart disease Sister     Low heart rate  . Thyroid disease Brother   . Thyroid disease Brother    Allergies  Allergen Reactions  . Bupropion Hcl     REACTION: itching   Current Outpatient Prescriptions on File Prior to Visit  Medication Sig Dispense Refill  . cetirizine (ZYRTEC) 10 MG tablet Take 10 mg by mouth daily.      . diphenhydramine-acetaminophen (TYLENOL PM) 25-500 MG TABS Take 1 tablet by mouth at bedtime as needed.        . ferrous sulfate 325 (65 FE) MG tablet Take 1 tablet (325 mg total) by mouth 2 (two) times daily with a meal.  60 tablet  5  . fexofenadine (ALLEGRA) 180 MG tablet Take 180 mg by mouth daily.      . MULTIPLE VITAMINS PO Take by mouth daily.        . Naproxen Sodium (ALEVE) 220 MG CAPS Take 1 capsule by mouth daily.        Marland Kitchen  sertraline (ZOLOFT) 100 MG tablet Take 100 mg by mouth daily.        Marland Kitchen SYNTHROID 75 MCG tablet TAKE 1 TABLET BY MOUTH EVERY DAY  30 tablet  11  . albuterol (PROVENTIL HFA;VENTOLIN HFA) 108 (90 BASE) MCG/ACT inhaler Inhale 2 puffs into the lungs every 4 (four) hours as needed for wheezing or shortness of breath.  1 Inhaler  0       Review of Systems Review of Systems  Constitutional: Negative for fever, appetite change, fatigue and unexpected weight change.  ENT pos for runny and stuffy nose and sneezing  Eyes: Negative for pain and visual disturbance.  Respiratory: Negative for wheeze or sob   Cardiovascular: Negative for cp or palpitations    Gastrointestinal: Negative for nausea, diarrhea and constipation.  Genitourinary: Negative for urgency and frequency.  Skin: Negative for pallor or rash   MSK pos for knee pain with slt swelling , neg for other joint complaints or redness Neurological: Negative for weakness, light-headedness, numbness and headaches.  Hematological: Negative for adenopathy. Does not  bruise/bleed easily.  Psychiatric/Behavioral: Negative for dysphoric mood. The patient is not nervous/anxious.          Objective:   Physical Exam  Constitutional: She appears well-developed and well-nourished. No distress.       Obese and well appearing   HENT:  Head: Normocephalic and atraumatic.  Right Ear: External ear normal.  Left Ear: External ear normal.  Mouth/Throat: Oropharynx is clear and moist.       Nares are injected and congested    Eyes: Conjunctivae and EOM are normal. Pupils are equal, round, and reactive to light. No scleral icterus.  Neck: Normal range of motion. Neck supple. No JVD present. Carotid bruit is not present. No thyromegaly present.  Cardiovascular: Normal rate, regular rhythm, normal heart sounds and intact distal pulses.   Pulmonary/Chest: Effort normal and breath sounds normal. No respiratory distress. She has no wheezes. She has no rales. She exhibits no tenderness.       Harsh bs throughout  Few isolated rhonchi No wheeze   Abdominal: She exhibits no abdominal bruit.  Musculoskeletal: She exhibits edema and tenderness.       L knee is slightly fuller than right - soft tissue swelling but no effusion noted  Tenderness at lateral joint line and behind knee and slt at patellar tendon  rom (flex)_ limited by pain  Neg drawer/ lachman- is stable  Favors R leg to walk  Lymphadenopathy:    She has no cervical adenopathy.  Neurological: She is alert. She has normal reflexes. She exhibits normal muscle tone.  Skin: Skin is warm and dry. No rash noted. No erythema. No pallor.  Psychiatric: She has a normal mood and affect.          Assessment & Plan:

## 2011-06-05 NOTE — Patient Instructions (Addendum)
For cough - use tessalon during the day , and robitussin with codeine at night  If worse/ fever or short of breath let me know Also try inhaler - for tightness or wheezing if needed  Update if not starting to improve in a week or if worsening    For knee-aleve 2 pills with food up to every 12 hours to help knee  Knee xray today  Get a "knee sleeve" -- and wear it when working   For weight loss - try weight watchers or app "my fitness pal" -- with exercise 5 days per week (bike is best or elliptical)

## 2011-06-05 NOTE — Telephone Encounter (Signed)
Was sent electronically.

## 2011-06-07 ENCOUNTER — Telehealth: Payer: Self-pay | Admitting: Family Medicine

## 2011-06-07 DIAGNOSIS — M25569 Pain in unspecified knee: Secondary | ICD-10-CM

## 2011-06-07 NOTE — Telephone Encounter (Signed)
Ref done  

## 2011-06-07 NOTE — Telephone Encounter (Signed)
Message copied by Judy Pimple on Thu Jun 07, 2011  8:26 AM ------      Message from: Gilmer Mor      Created: Wed Jun 06, 2011 12:24 PM       Patient advised as instructed via telephone, she would like referral.

## 2011-06-28 ENCOUNTER — Other Ambulatory Visit: Payer: Self-pay | Admitting: Family Medicine

## 2011-07-11 ENCOUNTER — Ambulatory Visit (INDEPENDENT_AMBULATORY_CARE_PROVIDER_SITE_OTHER): Payer: Federal, State, Local not specified - PPO | Admitting: Family Medicine

## 2011-07-11 ENCOUNTER — Encounter: Payer: Self-pay | Admitting: Family Medicine

## 2011-07-11 ENCOUNTER — Ambulatory Visit (INDEPENDENT_AMBULATORY_CARE_PROVIDER_SITE_OTHER)
Admission: RE | Admit: 2011-07-11 | Discharge: 2011-07-11 | Disposition: A | Discharge status: Home / Self Care | Payer: Federal, State, Local not specified - PPO | Source: Ambulatory Visit | Attending: Family Medicine | Admitting: Family Medicine

## 2011-07-11 VITALS — BP 102/70 | HR 66 | Temp 98.4°F | Ht 66.0 in | Wt 205.4 lb

## 2011-07-11 DIAGNOSIS — M79672 Pain in left foot: Secondary | ICD-10-CM

## 2011-07-11 DIAGNOSIS — M79609 Pain in unspecified limb: Secondary | ICD-10-CM

## 2011-07-11 NOTE — Progress Notes (Signed)
  Patient Name: Janet Douglas Date of Birth: 1965-10-17 Age: 46 y.o. Medical Record Number: [id number] Gender: female Date of Encounter: 07/11/2011  History of Present Illness:  Janet Douglas is a 46 y.o. very pleasant female patient who presents with the following:  F/u L foot: Stepped out on the floor on Thursday. When wearing no shoes will feel the pain mostly in the forefoot. She is a Paramedic. She has had some problems with her LEFT forefoot in the past. She also had some problems are 1st MTP joint. She stepped down fairly heavily, now she's having some pain along with some mild swelling in this area. No ecchymosis. She is mildly limping.   Past Medical History, Surgical History, Social History, Family History, Problem List, Medications, and Allergies have been reviewed and updated if relevant.  Review of Systems:  GEN: No fevers, chills. Nontoxic. Primarily MSK c/o today. MSK: Detailed in the HPI GI: tolerating PO intake without difficulty Neuro: No numbness, parasthesias, or tingling associated. Otherwise the pertinent positives of the ROS are noted above.    Physical Examination: Filed Vitals:   07/11/11 1405  BP: 102/70  Pulse: 66  Temp: 98.4 F (36.9 C)  TempSrc: Oral  Height: 5\' 6"  (1.676 m)  Weight: 205 lb 6.4 oz (93.169 kg)  SpO2: 98%    Body mass index is 33.15 kg/(m^2).   GEN: WDWN, NAD, Non-toxic, Alert & Oriented x 3 HEENT: Atraumatic, Normocephalic.  Ears and Nose: No external deformity. EXTR: No clubbing/cyanosis/edema PSYCH: Normally interactive. Conversant. Not depressed or anxious appearing.  Calm demeanor.   Patient does have some mild-to-moderate tenderness on the shaft of the 1st and 2nd metatarsal as well as some tenderness in the 1st MTP joint. Mild pain with MTP joint manipulation on the 1st. Some pain between the 1st and 2nd in the soft tissue.  Nontender in the navicular, 5th metatarsal base, malleoli, calcaneus, cuneiforms, cuboid.  Stable drawer examination. Stable sub-talar tilt exam Assessment and Plan: 1. Left foot pain  DG Foot Complete Left    Suspect mostly bone contusion. I've given her a note for work so that she can wear her some more comfortable shoes, and she finds it when she is able to wear her tennis shoes her foot doesn't really hurt so bad. If she continues to have some problems in 3-4 weeks, we can recheck.  Dg Foot Complete Left  07/11/2011  *RADIOLOGY REPORT*  Clinical Data: Left foot pain  LEFT FOOT - COMPLETE 3+ VIEW  Comparison: None.  Findings: Three views of the left foot submitted.  No acute fracture or subluxation.  There is small plantar spur of the calcaneus.  Small posterior spur of the calcaneus at the Achilles tendon insertion.  IMPRESSION: No acute fracture or subluxation.  Small plantar and posterior spur of the calcaneus.  Original Report Authenticated By: Natasha Mead, M.D.

## 2011-09-26 ENCOUNTER — Other Ambulatory Visit: Payer: Self-pay | Admitting: Family Medicine

## 2011-09-26 NOTE — Telephone Encounter (Signed)
Will refill electronically  

## 2011-09-26 NOTE — Telephone Encounter (Signed)
Received refill request electronically from pharmacy. Last office visit 07/11/11; acute visit. Is it okay to refill medication?

## 2011-11-26 ENCOUNTER — Other Ambulatory Visit: Payer: Self-pay | Admitting: Family Medicine

## 2011-11-26 NOTE — Telephone Encounter (Signed)
Ok to refill 

## 2011-11-26 NOTE — Telephone Encounter (Signed)
She can have 5 refils of that-thanks

## 2012-01-30 ENCOUNTER — Other Ambulatory Visit: Payer: Self-pay | Admitting: Family Medicine

## 2012-01-30 ENCOUNTER — Other Ambulatory Visit: Payer: Self-pay | Admitting: *Deleted

## 2012-01-30 MED ORDER — LEVOTHYROXINE SODIUM 75 MCG PO TABS: ORAL_TABLET | ORAL | Status: DC

## 2012-01-30 NOTE — Telephone Encounter (Signed)
Pt scheduled appt for 04/15/11 and refilled med till then

## 2012-01-30 NOTE — Telephone Encounter (Signed)
No future appts. no recent f/u or CPE appts or labs, ok to refill?

## 2012-01-30 NOTE — Telephone Encounter (Signed)
Please schedule f/u in the spring and refil until then, thanks

## 2012-02-28 ENCOUNTER — Other Ambulatory Visit: Payer: Self-pay | Admitting: Family Medicine

## 2012-03-19 ENCOUNTER — Other Ambulatory Visit: Payer: Self-pay | Admitting: Obstetrics and Gynecology

## 2012-03-19 DIAGNOSIS — Z1231 Encounter for screening mammogram for malignant neoplasm of breast: Secondary | ICD-10-CM

## 2012-04-14 ENCOUNTER — Encounter: Payer: Self-pay | Admitting: Family Medicine

## 2012-04-14 ENCOUNTER — Ambulatory Visit (INDEPENDENT_AMBULATORY_CARE_PROVIDER_SITE_OTHER): Payer: Federal, State, Local not specified - PPO | Admitting: Family Medicine

## 2012-04-14 VITALS — BP 106/68 | HR 59 | Temp 98.4°F | Ht 66.0 in | Wt 198.8 lb

## 2012-04-14 DIAGNOSIS — E039 Hypothyroidism, unspecified: Secondary | ICD-10-CM

## 2012-04-14 DIAGNOSIS — L989 Disorder of the skin and subcutaneous tissue, unspecified: Secondary | ICD-10-CM | POA: Insufficient documentation

## 2012-04-14 LAB — LIPID PANEL
Cholesterol: 138 mg/dL (ref 0–200)
HDL: 37.6 mg/dL — ABNORMAL LOW (ref >39.00)
LDL Cholesterol: 79 mg/dL (ref 0–99)
Total CHOL/HDL Ratio: 4
Triglycerides: 105 mg/dL (ref 0.0–149.0)
VLDL: 21 mg/dL (ref 0.0–40.0)

## 2012-04-14 MED ORDER — ALBUTEROL SULFATE HFA 108 (90 BASE) MCG/ACT IN AERS: 2.0000 | INHALATION_SPRAY | RESPIRATORY_TRACT | Status: DC | PRN

## 2012-04-14 NOTE — Assessment & Plan Note (Signed)
No symptoms or changes Lab today for tsh and chol

## 2012-04-14 NOTE — Assessment & Plan Note (Signed)
Under R eye - is keratotic in nature/ new and over 3-4 mm  Pt will see opthy tomorrow and discuss removal there or with dermatologist

## 2012-04-14 NOTE — Progress Notes (Signed)
Subjective:    Patient ID: Janet Douglas, female    DOB: 1965/07/19, 47 y.o.   MRN: [id number]  HPI Here for f/u of chonic medical conditions and also R eye problem  Wt is down 7 lb (more from a year ago) Diet/exercise- just started wt watchers - is still learning the system  Has to get out of bad habits  Lot of stress at work- easy to loose focus   Flu vaccine--was in nov   Eye problem R- has a skin tag next to eye- will have eye doctor appt tomorrow  Has been there for 2 mo - getting bigger   Hypothyroidism  Pt has no clinical changes No change in energy level/ hair or skin/ edema and no tremor Lab Results  Component Value Date   TSH 1.95 10/18/2010    Is time to check   Last visit with gyn was April - all was ok   Takes zoloft for depression- stable on that   Patient Active Problem List  Diagnosis  . HYPOTHYROIDISM  . OBESITY  . ANEMIA-NOS  . NONORGANIC SLEEP DISORDER UNSPECIFIED  . DEPRESSION, SITUATIONAL  . POOR CONCENTRATION  . METATARSALGIA  . OTHER ENTHESOPATHY OF ANKLE AND TARSUS  . PLANTAR FASCIITIS  . SESAMOIDITIS  . TRANSAMINASES, SERUM, ELEVATED  . ABNORMAL PAP SMEAR  . ELEVATED BLOOD PRESSURE WITHOUT DIAGNOSIS OF HYPERTENSION  . Allergic rhinitis  . Obesity   Past Medical History  Diagnosis Date  . Anemia     NOS  . Hypothyroidism   . Brain tumor     S/P resection with shunt   Past Surgical History  Procedure Laterality Date  . Abdominal US  05/19/1999    Negative  . Tubal ligation    . 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 Abdominal US  08/10    Normal   History  Substance Use Topics  . Smoking status: Never Smoker   . Smokeless tobacco: Not on file  . Alcohol Use: Yes     Comment: Minimal   Family History  Problem Relation Age of Onset  . Thyroid disease Mother   . Thyroid disease Sister   . Heart disease Sister     Low heart rate  . Thyroid disease Brother   . Thyroid disease Brother    Allergies  Allergen  Reactions  . Bupropion Hcl     REACTION: itching   Current Outpatient Prescriptions on File Prior to Visit  Medication Sig Dispense Refill  . amoxicillin (AMOXIL) 500 MG capsule TAKE 4 CAPSULES BY MOUTH ONE HOUR BEFORE DENTAL PROCEDURE  4 capsule  5  . cetirizine (ZYRTEC) 10 MG tablet Take 10 mg by mouth as needed.       . diphenhydramine-acetaminophen (TYLENOL PM) 25-500 MG TABS Take 1 tablet by mouth at bedtime as needed.        Marland Kitchen levothyroxine (SYNTHROID, LEVOTHROID) 75 MCG tablet TAKE 1 TABLET BY MOUTH EVERY DAY  90 tablet  0  . levothyroxine (SYNTHROID, LEVOTHROID) 75 MCG tablet TAKE 1 TABLET BY MOUTH EVERY DAY  30 tablet  1  . MULTIPLE VITAMINS PO Take by mouth daily.        . Naproxen Sodium (ALEVE) 220 MG CAPS Take 1 capsule by mouth daily.        . sertraline (ZOLOFT) 100 MG tablet TAKE 1 TABLET BY MOUTH DAILY  90 tablet  2  . albuterol (PROVENTIL HFA;VENTOLIN HFA) 108 (90 BASE) MCG/ACT inhaler Inhale  2 puffs into the lungs every 4 (four) hours as needed for wheezing or shortness of breath.  1 Inhaler  0   No current facility-administered medications on file prior to visit.     Review of Systems Review of Systems  Constitutional: Negative for fever, appetite change, fatigue and unexpected weight change.  Eyes: Negative for pain and visual disturbance.  Respiratory: Negative for cough and shortness of breath.   Cardiovascular: Negative for cp or palpitations    Gastrointestinal: Negative for nausea, diarrhea and constipation.  Genitourinary: Negative for urgency and frequency.  Skin: Negative for pallor or rash  pos for skin lesion under her eye  Neurological: Negative for weakness, light-headedness, numbness and headaches.  Hematological: Negative for adenopathy. Does not bruise/bleed easily.  Psychiatric/Behavioral: Negative for dysphoric mood. The patient is not nervous/anxious.  (mood is stable despite stress)       Objective:   Physical Exam  Constitutional: She  appears well-developed and well-nourished. No distress.  obese and well appearing   HENT:  Head: Normocephalic and atraumatic.  Mouth/Throat: Oropharynx is clear and moist.  Eyes: Conjunctivae and EOM are normal. Pupils are equal, round, and reactive to light. Right eye exhibits no discharge. Left eye exhibits no discharge.  Neck: Normal range of motion. Neck supple. No JVD present. Carotid bruit is not present. No thyromegaly present.  Cardiovascular: Normal rate, regular rhythm, normal heart sounds and intact distal pulses.  Exam reveals no gallop.   Pulmonary/Chest: Effort normal and breath sounds normal. No respiratory distress. She has no wheezes.  Abdominal: Soft. Bowel sounds are normal.  Musculoskeletal: She exhibits no edema.  Lymphadenopathy:    She has no cervical adenopathy.  Neurological: She is alert. She has normal reflexes. She displays no tremor. No cranial nerve deficit. She exhibits normal muscle tone. Coordination normal.  Skin: Skin is warm and dry. No rash noted. No erythema. No pallor.  3-4 cm raised hyperkeratotic lesion under R eye - nontender Pink and brown in color  Psychiatric: She has a normal mood and affect.          Assessment & Plan:

## 2012-04-14 NOTE — Patient Instructions (Addendum)
Keep working on weight loss- you are doing great See Dr Patsy Lager about foot pain when you are able Thyroid labs today I sent in your inhaler px

## 2012-04-15 ENCOUNTER — Encounter: Payer: Self-pay | Admitting: *Deleted

## 2012-04-22 ENCOUNTER — Ambulatory Visit
Admission: RE | Admit: 2012-04-22 | Discharge: 2012-04-22 | Disposition: A | Discharge status: Home / Self Care | Payer: Federal, State, Local not specified - PPO | Source: Ambulatory Visit | Attending: Obstetrics and Gynecology | Admitting: Obstetrics and Gynecology

## 2012-04-22 ENCOUNTER — Ambulatory Visit: Payer: Federal, State, Local not specified - PPO

## 2012-04-22 DIAGNOSIS — Z1231 Encounter for screening mammogram for malignant neoplasm of breast: Secondary | ICD-10-CM

## 2012-05-01 ENCOUNTER — Telehealth: Payer: Self-pay

## 2012-05-01 DIAGNOSIS — L989 Disorder of the skin and subcutaneous tissue, unspecified: Secondary | ICD-10-CM

## 2012-05-01 NOTE — Telephone Encounter (Signed)
Will do ref - let her know Shirlee Limerick will be calling her

## 2012-05-01 NOTE — Telephone Encounter (Signed)
Pt request dermatology referral for area under rt eye as noted 04/14/12 visit. Pt say opthy but they did not want to remove.Please advise.

## 2012-05-02 NOTE — Telephone Encounter (Signed)
Pt.notified

## 2012-06-16 ENCOUNTER — Ambulatory Visit (INDEPENDENT_AMBULATORY_CARE_PROVIDER_SITE_OTHER): Payer: Federal, State, Local not specified - PPO | Admitting: Family Medicine

## 2012-06-16 ENCOUNTER — Encounter: Payer: Self-pay | Admitting: Family Medicine

## 2012-06-16 VITALS — BP 110/64 | HR 64 | Temp 98.2°F | Ht 66.0 in | Wt 199.0 lb

## 2012-06-16 DIAGNOSIS — R220 Localized swelling, mass and lump, head: Secondary | ICD-10-CM | POA: Insufficient documentation

## 2012-06-16 NOTE — Progress Notes (Signed)
Subjective:    Patient ID: Janet Douglas, female    DOB: 1965/04/06, 47 y.o.   MRN: [id number]  HPI Pt here with head pain Has a tender spot - soreness over area of shunt - and it started this am  No fever /not sick feeling No new headaches  No nausea or stiff neck  She does feel a swollen area - just medial to the shunt   She has prior hx of hemanioblastoma (cerebellar) in past with surgery and shunt placed  Peritoneal drainage -no abdominal pain or problems   Never had a shunt revision -no problems   She sees Dr Franky Macho for neurosurg [facility] office for dermatology   Patient Active Problem List   Diagnosis Date Noted  . Skin lesion of face 04/14/2012  . Obesity 06/05/2011  . Allergic rhinitis 05/18/2010  . DEPRESSION, SITUATIONAL 06/15/2009  . ELEVATED BLOOD PRESSURE WITHOUT DIAGNOSIS OF HYPERTENSION 06/15/2009  . PLANTAR FASCIITIS 05/20/2008  . METATARSALGIA 05/06/2008  . OTHER ENTHESOPATHY OF ANKLE AND TARSUS 05/06/2008  . SESAMOIDITIS 11/04/2007  . OBESITY 04/24/2007  . NONORGANIC SLEEP DISORDER UNSPECIFIED 04/24/2007  . POOR CONCENTRATION 09/10/2006  . HYPOTHYROIDISM 09/09/2006  . ANEMIA-NOS 09/09/2006  . TRANSAMINASES, SERUM, ELEVATED 09/09/2006  . ABNORMAL PAP SMEAR 09/09/2006   Past Medical History  Diagnosis Date  . Anemia     NOS  . Hypothyroidism   . Brain tumor     S/P resection with shunt   Past Surgical History  Procedure Laterality Date  . Abdominal US  05/19/1999    Negative  . Tubal ligation    . 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 Abdominal US  08/10    Normal   History  Substance Use Topics  . Smoking status: Never Smoker   . Smokeless tobacco: Not on file  . Alcohol Use: Yes     Comment: Minimal   Family History  Problem Relation Age of Onset  . Thyroid disease Mother   . Thyroid disease Sister   . Heart disease Sister     Low heart rate  . Thyroid disease Brother   . Thyroid disease Brother    Allergies   Allergen Reactions  . Bupropion Hcl     REACTION: itching   Current Outpatient Prescriptions on File Prior to Visit  Medication Sig Dispense Refill  . albuterol (PROVENTIL HFA;VENTOLIN HFA) 108 (90 BASE) MCG/ACT inhaler Inhale 2 puffs into the lungs every 4 (four) hours as needed for wheezing or shortness of breath.  1 Inhaler  1  . amoxicillin (AMOXIL) 500 MG capsule TAKE 4 CAPSULES BY MOUTH ONE HOUR BEFORE DENTAL PROCEDURE  4 capsule  5  . cetirizine (ZYRTEC) 10 MG tablet Take 10 mg by mouth as needed.       . diphenhydramine-acetaminophen (TYLENOL PM) 25-500 MG TABS Take 1 tablet by mouth at bedtime as needed.        Marland Kitchen levothyroxine (SYNTHROID, LEVOTHROID) 75 MCG tablet TAKE 1 TABLET BY MOUTH EVERY DAY  90 tablet  0  . MULTIPLE VITAMINS PO Take by mouth daily.        . Naproxen Sodium (ALEVE) 220 MG CAPS Take 1 capsule by mouth daily.        . sertraline (ZOLOFT) 100 MG tablet TAKE 1 TABLET BY MOUTH DAILY  90 tablet  2   No current facility-administered medications on file prior to visit.    Review of Systems Review of Systems  Constitutional: Negative for  fever, appetite change, fatigue and unexpected weight change.  Eyes: Negative for pain and visual disturbance.  Respiratory: Negative for cough and shortness of breath.   Cardiovascular: Negative for cp or palpitations    Gastrointestinal: Negative for nausea, diarrhea and constipation.  Genitourinary: Negative for urgency and frequency.  Skin: Negative for pallor or rash  pos for sore area on scalp Neurological: Negative for weakness, light-headedness, numbness and headaches.  Hematological: Negative for adenopathy. Does not bruise/bleed easily.  Psychiatric/Behavioral: Negative for dysphoric mood. The patient is not nervous/anxious.         Objective:   Physical Exam  Constitutional: She is oriented to person, place, and time. She appears well-developed and well-nourished. No distress.  obese and well appearing   HENT:   Head: Normocephalic.  Mouth/Throat: Oropharynx is clear and moist.  On R frontal scalp - just medial to palpable shunt , 1 cm round area of induration felt that is slightly tender without redness or drainage (resembles a cyst)   Eyes: Conjunctivae and EOM are normal. Pupils are equal, round, and reactive to light. Right eye exhibits no discharge. Left eye exhibits no discharge. No scleral icterus.  Neck: Normal range of motion. Neck supple. No JVD present. No thyromegaly present.  Cardiovascular: Normal rate and regular rhythm.   Pulmonary/Chest: Effort normal and breath sounds normal.  Abdominal: Soft. Bowel sounds are normal.  Lymphadenopathy:    She has no cervical adenopathy.  Neurological: She is alert and oriented to person, place, and time. She has normal reflexes. She displays no atrophy and no tremor. No cranial nerve deficit or sensory deficit. She exhibits normal muscle tone. Coordination and gait normal.  No cerebellar signs   Skin: Skin is warm and dry. No rash noted. No erythema. No pallor.  See scalp exam   Psychiatric: She has a normal mood and affect.          Assessment & Plan:

## 2012-06-16 NOTE — Assessment & Plan Note (Signed)
Based on shape and texture- I think this is a scalp cyst just medial to her shunt  No s/s of infection -not red or draining Ref to derm for eval inst pt to call if ha/ fever/ worse pain or any redness - or seek care if after hours

## 2012-06-16 NOTE — Patient Instructions (Addendum)
I feel an area that may be a scalp cyst near the area of shunt  Watch for redness - if you get increased swelling/ redness /tenderness- call (or fever or new symptoms) We will refer you to dermatology for eval  See Shirlee Limerick on the way out  You can use a warm compress if needed

## 2012-06-25 ENCOUNTER — Other Ambulatory Visit: Payer: Self-pay | Admitting: Family Medicine

## 2012-11-28 ENCOUNTER — Encounter: Payer: Self-pay | Admitting: Family Medicine

## 2012-11-28 ENCOUNTER — Ambulatory Visit (INDEPENDENT_AMBULATORY_CARE_PROVIDER_SITE_OTHER): Payer: Federal, State, Local not specified - PPO | Admitting: Family Medicine

## 2012-11-28 ENCOUNTER — Telehealth: Payer: Self-pay | Admitting: *Deleted

## 2012-11-28 VITALS — BP 114/80 | HR 65 | Temp 98.7°F | Ht 66.0 in | Wt 196.0 lb

## 2012-11-28 DIAGNOSIS — R51 Headache: Secondary | ICD-10-CM

## 2012-11-28 DIAGNOSIS — Z8669 Personal history of other diseases of the nervous system and sense organs: Secondary | ICD-10-CM

## 2012-11-28 DIAGNOSIS — Z87898 Personal history of other specified conditions: Secondary | ICD-10-CM

## 2012-11-28 DIAGNOSIS — Z23 Encounter for immunization: Secondary | ICD-10-CM

## 2012-11-28 LAB — BASIC METABOLIC PANEL
BUN: 16 mg/dL (ref 6–23)
Calcium: 9.6 mg/dL (ref 8.4–10.5)
Creatinine, Ser: 0.9 mg/dL (ref 0.4–1.2)
GFR: 74.95 mL/min (ref >60.00)
Glucose, Bld: 107 mg/dL — ABNORMAL HIGH (ref 70–99)
Potassium: 4.2 mEq/L (ref 3.5–5.1)

## 2012-11-28 MED ORDER — ALPRAZOLAM 0.5 MG PO TABS: ORAL_TABLET | ORAL | Status: DC

## 2012-11-28 NOTE — Telephone Encounter (Signed)
Xanax written for call in

## 2012-11-28 NOTE — Progress Notes (Signed)
Subjective:    Patient ID: Janet Douglas, female    DOB: [birth year], 47 y.o.   MRN: [id number]  HPI Here for a sharp pain in her L ear  Sort of above her ear / on her head just above her ear ? Allergy related Comes and goes  Non tender- scalp or ear  No drainage from ear   Took an aleve  This am the area just feels achey    Not too much congestion  Hearing is baseline   No fever  No swimming   This scares her in light of her hx of brain tumor  Last MR was about 2 years ago   Does get her shunt checked every 2 years    Patient Active Problem List   Diagnosis Date Noted  . Lump of scalp 06/16/2012  . Skin lesion of face 04/14/2012  . Obesity 06/05/2011  . Allergic rhinitis 05/18/2010  . DEPRESSION, SITUATIONAL 06/15/2009  . ELEVATED BLOOD PRESSURE WITHOUT DIAGNOSIS OF HYPERTENSION 06/15/2009  . PLANTAR FASCIITIS 05/20/2008  . METATARSALGIA 05/06/2008  . OTHER ENTHESOPATHY OF ANKLE AND TARSUS 05/06/2008  . SESAMOIDITIS 11/04/2007  . OBESITY 04/24/2007  . NONORGANIC SLEEP DISORDER UNSPECIFIED 04/24/2007  . POOR CONCENTRATION 09/10/2006  . HYPOTHYROIDISM 09/09/2006  . ANEMIA-NOS 09/09/2006  . TRANSAMINASES, SERUM, ELEVATED 09/09/2006  . ABNORMAL PAP SMEAR 09/09/2006   Past Medical History  Diagnosis Date  . Anemia     NOS  . Hypothyroidism   . Brain tumor     S/P resection with shunt   Past Surgical History  Procedure Laterality Date  . Abdominal US  05/19/1999    Negative  . Tubal ligation    . 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 Abdominal US  08/10    Normal   History  Substance Use Topics  . Smoking status: Never Smoker   . Smokeless tobacco: Not on file  . Alcohol Use: Yes     Comment: Minimal   Family History  Problem Relation Age of Onset  . Thyroid disease Mother   . Thyroid disease Sister   . Heart disease Sister     Low heart rate  . Thyroid disease Brother   . Thyroid disease Brother    Allergies  Allergen  Reactions  . Bupropion Hcl     REACTION: itching   Current Outpatient Prescriptions on File Prior to Visit  Medication Sig Dispense Refill  . albuterol (PROVENTIL HFA;VENTOLIN HFA) 108 (90 BASE) MCG/ACT inhaler Inhale 2 puffs into the lungs every 4 (four) hours as needed for wheezing or shortness of breath.  1 Inhaler  1  . amoxicillin (AMOXIL) 500 MG capsule TAKE 4 CAPSULES BY MOUTH ONE HOUR BEFORE DENTAL PROCEDURE  4 capsule  5  . cetirizine (ZYRTEC) 10 MG tablet Take 10 mg by mouth as needed.       . diphenhydramine-acetaminophen (TYLENOL PM) 25-500 MG TABS Take 1 tablet by mouth at bedtime as needed.        Marland Kitchen levothyroxine (SYNTHROID, LEVOTHROID) 75 MCG tablet TAKE 1 TABLET BY MOUTH EVERY DAY  90 tablet  1  . MULTIPLE VITAMINS PO Take by mouth daily.        . Naproxen Sodium (ALEVE) 220 MG CAPS Take 1 capsule by mouth daily.        . sertraline (ZOLOFT) 100 MG tablet TAKE 1 TABLET BY MOUTH DAILY  90 tablet  2   No current facility-administered medications on file prior to  visit.      Review of Systems Review of Systems  Constitutional: Negative for fever, appetite change, fatigue and unexpected weight change.  Eyes: Negative for pain and visual disturbance.  ENT neg for rhinorrhea and pos for mild congestion, neg for ST Respiratory: Negative for cough and shortness of breath.   Cardiovascular: Negative for cp or palpitations    Gastrointestinal: Negative for nausea, diarrhea and constipation.  Genitourinary: Negative for urgency and frequency.  Skin: Negative for pallor or rash   Neurological: Negative for weakness, light-headedness, numbness and pos for new L sided headache Hematological: Negative for adenopathy. Does not bruise/bleed easily.  Psychiatric/Behavioral: Negative for dysphoric mood. The patient is not nervous/anxious.         Objective:   Physical Exam  Constitutional: She is oriented to person, place, and time. She appears well-developed and well-nourished. No  distress.  obese and well appearing   HENT:  Head: Normocephalic and atraumatic.  Right Ear: External ear normal.  Left Ear: External ear normal.  Nose: Nose normal.  Mouth/Throat: Oropharynx is clear and moist.  Nares are boggy but clear No sinus or temporal tenderness No scalp rash  Eyes: Conjunctivae and EOM are normal. Pupils are equal, round, and reactive to light. Right eye exhibits no discharge. Left eye exhibits no discharge. No scleral icterus.  Neck: Neck supple. No JVD present. No thyromegaly present.  Cardiovascular: Regular rhythm.   Pulmonary/Chest: Effort normal and breath sounds normal.  Musculoskeletal: She exhibits no edema and no tenderness.  Lymphadenopathy:    She has no cervical adenopathy.  Neurological: She is alert and oriented to person, place, and time. She has normal reflexes. She displays no atrophy and no tremor. No cranial nerve deficit or sensory deficit. She exhibits normal muscle tone. She displays a negative Romberg sign. Coordination and gait normal.  No focal cerebellar signs   Skin: Skin is warm and dry. No rash noted. No erythema. No pallor.  Psychiatric: Her mood appears anxious.  Almost a bit tearful          Assessment & Plan:

## 2012-11-28 NOTE — Patient Instructions (Signed)
Stop up front to get your appt for MRI Lab today for MRI  If symptoms suddenly worsen- go to ER

## 2012-11-28 NOTE — Telephone Encounter (Signed)
MRI appt scheduled for Monday 12/01/12 at 7:00 pm at [facility], pt is slightly claustrophobic (they don't have an open unit) and would like a med to help with anxiety, pt's husband is taking her to appt so she will not be driving, please advise

## 2012-11-30 NOTE — Assessment & Plan Note (Signed)
New L sided head pain  MRI planned

## 2012-11-30 NOTE — Assessment & Plan Note (Signed)
New head pain very worrisome to pt with hx of prior brain tumor (cerebellar hemangioblastoma) No ataxia or other symptoms  No hx of shunt dysfunction  MRI ordered

## 2012-12-01 ENCOUNTER — Ambulatory Visit
Admission: RE | Admit: 2012-12-01 | Discharge: 2012-12-01 | Disposition: A | Discharge status: Home / Self Care | Payer: Federal, State, Local not specified - PPO | Source: Ambulatory Visit | Attending: Family Medicine | Admitting: Family Medicine

## 2012-12-01 DIAGNOSIS — Z87898 Personal history of other specified conditions: Secondary | ICD-10-CM

## 2012-12-01 MED ORDER — GADOBENATE DIMEGLUMINE 529 MG/ML IV SOLN
18.0000 mL | Freq: Once | INTRAVENOUS | Status: AC | PRN
  Administered 2012-12-01: 18 mL via INTRAVENOUS

## 2012-12-01 NOTE — Telephone Encounter (Signed)
Left voicemail letting pt know Rx sent to pharmacy and Rx called in as prescribed

## 2012-12-02 ENCOUNTER — Other Ambulatory Visit: Payer: Self-pay | Admitting: Family Medicine

## 2012-12-02 NOTE — Telephone Encounter (Signed)
Please refill times one  

## 2012-12-02 NOTE — Telephone Encounter (Signed)
Fax refill request, please advise  

## 2012-12-03 MED ORDER — AMOXICILLIN 500 MG PO CAPS: ORAL_CAPSULE | ORAL | Status: DC

## 2012-12-05 ENCOUNTER — Ambulatory Visit (INDEPENDENT_AMBULATORY_CARE_PROVIDER_SITE_OTHER): Payer: Federal, State, Local not specified - PPO | Admitting: Family Medicine

## 2012-12-05 ENCOUNTER — Encounter: Payer: Self-pay | Admitting: Family Medicine

## 2012-12-05 VITALS — BP 118/76 | HR 89 | Temp 98.0°F | Ht 66.0 in | Wt 202.2 lb

## 2012-12-05 DIAGNOSIS — J069 Acute upper respiratory infection, unspecified: Secondary | ICD-10-CM

## 2012-12-05 DIAGNOSIS — R51 Headache: Secondary | ICD-10-CM

## 2012-12-05 MED ORDER — GUAIFENESIN-CODEINE 100-10 MG/5ML PO SYRP
5.0000 mL | ORAL_SOLUTION | Freq: Three times a day (TID) | ORAL | Status: DC | PRN
Start: 2012-12-05 — End: 2014-07-12

## 2012-12-05 NOTE — Progress Notes (Signed)
Subjective:    Patient ID: Janet Douglas, female    DOB: February 08, 1966, 47 y.o.   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  Patient Active Problem List   Diagnosis Date Noted  . Left-sided headache 11/28/2012  . History of brain tumor 11/28/2012  . Lump of scalp 06/16/2012  . Skin lesion of face 04/14/2012  . Obesity 06/05/2011  . Allergic rhinitis 05/18/2010  . DEPRESSION, SITUATIONAL 06/15/2009  . ELEVATED BLOOD PRESSURE WITHOUT DIAGNOSIS OF HYPERTENSION 06/15/2009  . PLANTAR FASCIITIS 05/20/2008  . METATARSALGIA 05/06/2008  . OTHER ENTHESOPATHY OF ANKLE AND TARSUS 05/06/2008  . SESAMOIDITIS 11/04/2007  . OBESITY 04/24/2007  . NONORGANIC SLEEP DISORDER UNSPECIFIED 04/24/2007  . POOR CONCENTRATION 09/10/2006  . HYPOTHYROIDISM 09/09/2006  . ANEMIA-NOS 09/09/2006  . TRANSAMINASES, SERUM, ELEVATED 09/09/2006  . ABNORMAL PAP SMEAR 09/09/2006   Past Medical History  Diagnosis Date  . Anemia     NOS  . Hypothyroidism   . Brain tumor     S/P resection with shunt   Past Surgical History  Procedure Laterality Date  . Abdominal US  05/19/1999    Negative  . Tubal ligation    . 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 Abdominal US  08/10    Normal   History  Substance Use Topics  . Smoking status: Never Smoker   . Smokeless tobacco: Not  on file  . Alcohol Use: Yes     Comment: Minimal   Family History  Problem Relation Age of Onset  . Thyroid disease Mother   . Thyroid disease Sister   . Heart disease Sister     Low heart rate  . Thyroid disease Brother   . Thyroid disease Brother    Allergies  Allergen Reactions  . Bupropion Hcl     REACTION: itching   Current Outpatient Prescriptions on File Prior to Visit  Medication Sig Dispense Refill  . albuterol (PROVENTIL HFA;VENTOLIN HFA) 108 (90 BASE) MCG/ACT inhaler Inhale 2 puffs into the lungs every 4 (four) hours as needed for wheezing or shortness of breath.  1 Inhaler  1  . ALPRAZolam (XANAX) 0.5 MG tablet Take 1 pill 1 hour before procedure  2 tablet  0  . amoxicillin (AMOXIL) 500 MG capsule TAKE 4 CAPSULES BY MOUTH ONE HOUR BEFORE DENTAL PROCEDURE  4 capsule  0  . cetirizine (ZYRTEC) 10 MG tablet Take 10 mg by mouth as needed.       . diphenhydramine-acetaminophen (TYLENOL PM) 25-500 MG TABS Take 1 tablet by mouth at bedtime as needed.        Marland Kitchen levothyroxine (SYNTHROID, LEVOTHROID) 75 MCG tablet TAKE 1 TABLET BY MOUTH EVERY DAY  90 tablet  1  . MULTIPLE VITAMINS PO Take by mouth daily.        . Naproxen Sodium (ALEVE) 220 MG CAPS Take 1 capsule by mouth daily.        . sertraline (ZOLOFT) 100 MG tablet TAKE 1 TABLET BY MOUTH DAILY  90 tablet  2   No current facility-administered medications on file prior to visit.     Review of Systems Review of Systems  Constitutional: Negative for fever, appetite change, fatigue and unexpected weight change.  ENt pos for congestion/ rhinorrhea/sinus fullness  Eyes: Negative for pain and visual disturbance.  Respiratory: Negative for cough and shortness of breath.   Cardiovascular: Negative for cp or palpitations    Gastrointestinal: Negative for nausea, diarrhea and constipation.  Genitourinary: Negative for urgency and frequency. no incontinence  Skin: Negative for pallor or rash   Neurological: Negative for weakness,  numbness/ speech difficulty or ataxia.  Hematological: Negative for adenopathy. Does not bruise/bleed easily.  Psychiatric/Behavioral: Negative for dysphoric mood. The patient is not nervous/anxious.         Objective:   Physical Exam  Constitutional: She is oriented to person, place, and time. She appears well-developed and well-nourished. No distress.  HENT:  Head: Normocephalic and atraumatic.  Right Ear: External ear normal.  Left Ear: External ear normal.  Mouth/Throat: Oropharynx is clear and moist.  Nares are injected and congested  No sinus tenderness No mastoid tenderness   Ear canals and TMs are clear   Eyes: Conjunctivae and EOM are normal. Pupils are equal, round, and reactive to light. Right eye exhibits no discharge. Left eye exhibits no discharge. No scleral icterus.  Neck: Normal range of motion. Neck supple. No JVD present. No thyromegaly present.  Cardiovascular: Normal rate, regular rhythm, normal heart sounds and intact distal pulses.   Pulmonary/Chest: Effort normal and breath sounds normal. No respiratory distress. She has no wheezes. She has no rales.  Dry persistent cough  Musculoskeletal: She exhibits no edema.  Lymphadenopathy:    She has no cervical adenopathy.  Neurological: She is alert and oriented to person, place, and time. She has normal reflexes. She displays no tremor. No cranial nerve deficit or sensory deficit. She exhibits normal muscle tone. Coordination and gait normal.  Skin: Skin is warm and dry. No rash noted. No erythema. No pallor.  Psychiatric: She has a normal mood and affect.          Assessment & Plan:

## 2012-12-05 NOTE — Patient Instructions (Signed)
Use cough medicine as needed - up to every 8 hours if not driving Drink lots of fluids and get rest  I filled out [employer] form If headache is not improved next week - I will refer you to ENT doctor  If fever or other symptoms please alert me

## 2012-12-07 NOTE — Assessment & Plan Note (Signed)
Disc symptomatic care - see instructions on AVS Refilled cheratussin ac  Update if not starting to improve in a week or if worsening  -esp if worse ha or any fever

## 2012-12-07 NOTE — Assessment & Plan Note (Signed)
This is improved Now has viral uri with a less intense ha  Rev MRI in detail and pt given a copy- reassuring  Stable mastoid eff on L - inst pt if not better next week that we do need to ref her to ENT

## 2012-12-27 ENCOUNTER — Other Ambulatory Visit: Payer: Self-pay | Admitting: Family Medicine

## 2013-01-14 ENCOUNTER — Ambulatory Visit (INDEPENDENT_AMBULATORY_CARE_PROVIDER_SITE_OTHER)
Admission: RE | Admit: 2013-01-14 | Discharge: 2013-01-14 | Disposition: A | Discharge status: Home / Self Care | Payer: Federal, State, Local not specified - PPO | Source: Ambulatory Visit | Attending: Family Medicine | Admitting: Family Medicine

## 2013-01-14 ENCOUNTER — Encounter: Payer: Self-pay | Admitting: Family Medicine

## 2013-01-14 ENCOUNTER — Ambulatory Visit (INDEPENDENT_AMBULATORY_CARE_PROVIDER_SITE_OTHER): Payer: Federal, State, Local not specified - PPO | Admitting: Family Medicine

## 2013-01-14 VITALS — BP 124/78 | HR 85 | Temp 98.4°F | Ht 66.25 in | Wt 201.0 lb

## 2013-01-14 DIAGNOSIS — R059 Cough, unspecified: Secondary | ICD-10-CM

## 2013-01-14 DIAGNOSIS — R05 Cough: Secondary | ICD-10-CM

## 2013-01-14 MED ORDER — BENZONATATE 200 MG PO CAPS
200.0000 mg | ORAL_CAPSULE | Freq: Three times a day (TID) | ORAL | Status: DC | PRN
Start: 2013-01-14 — End: 2013-08-26

## 2013-01-14 NOTE — Progress Notes (Signed)
Subjective:    Patient ID: Janet Douglas, female    DOB: 1965-02-24, 47 y.o.   MRN: [id number]  HPI Has a cough that will not go away -- same cough she had in October  Non productive  Dry hacking cough-worse with temp changes No nasal symptoms or fever   Does not take zyrtec every day   No acid reflux symptoms  No ST or hoarseness  No nsaids   The guif cod med helps   Not wheezing at all  Patient Active Problem List   Diagnosis Date Noted  . Viral URI with cough 12/05/2012  . Left-sided headache 11/28/2012  . History of brain tumor 11/28/2012  . Lump of scalp 06/16/2012  . Skin lesion of face 04/14/2012  . Obesity 06/05/2011  . Allergic rhinitis 05/18/2010  . DEPRESSION, SITUATIONAL 06/15/2009  . ELEVATED BLOOD PRESSURE WITHOUT DIAGNOSIS OF HYPERTENSION 06/15/2009  . PLANTAR FASCIITIS 05/20/2008  . METATARSALGIA 05/06/2008  . OTHER ENTHESOPATHY OF ANKLE AND TARSUS 05/06/2008  . SESAMOIDITIS 11/04/2007  . OBESITY 04/24/2007  . NONORGANIC SLEEP DISORDER UNSPECIFIED 04/24/2007  . POOR CONCENTRATION 09/10/2006  . HYPOTHYROIDISM 09/09/2006  . ANEMIA-NOS 09/09/2006  . TRANSAMINASES, SERUM, ELEVATED 09/09/2006  . ABNORMAL PAP SMEAR 09/09/2006   Past Medical History  Diagnosis Date  . Anemia     NOS  . Hypothyroidism   . Brain tumor     S/P resection with shunt   Past Surgical History  Procedure Laterality Date  . Abdominal US  05/19/1999    Negative  . Tubal ligation    . 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 Abdominal US  08/10    Normal   History  Substance Use Topics  . Smoking status: Never Smoker   . Smokeless tobacco: Not on file  . Alcohol Use: Yes     Comment: Minimal   Family History  Problem Relation Age of Onset  . Thyroid disease Mother   . Thyroid disease Sister   . Heart disease Sister     Low heart rate  . Thyroid disease Brother   . Thyroid disease Brother    Allergies  Allergen Reactions  . Bupropion Hcl    REACTION: itching   Current Outpatient Prescriptions on File Prior to Visit  Medication Sig Dispense Refill  . albuterol (PROVENTIL HFA;VENTOLIN HFA) 108 (90 BASE) MCG/ACT inhaler Inhale 2 puffs into the lungs every 4 (four) hours as needed for wheezing or shortness of breath.  1 Inhaler  1  . ALPRAZolam (XANAX) 0.5 MG tablet Take 1 pill 1 hour before procedure  2 tablet  0  . amoxicillin (AMOXIL) 500 MG capsule TAKE 4 CAPSULES BY MOUTH ONE HOUR BEFORE DENTAL PROCEDURE  4 capsule  0  . cetirizine (ZYRTEC) 10 MG tablet Take 10 mg by mouth as needed.       . diphenhydramine-acetaminophen (TYLENOL PM) 25-500 MG TABS Take 1 tablet by mouth at bedtime as needed.        Marland Kitchen guaiFENesin-codeine (ROBITUSSIN AC) 100-10 MG/5ML syrup Take 5-10 mLs by mouth 3 (three) times daily as needed for cough (careful of sedation).  120 mL  0  . levothyroxine (SYNTHROID, LEVOTHROID) 75 MCG tablet TAKE 1 TABLET BY MOUTH EVERY DAY  90 tablet  1  . MULTIPLE VITAMINS PO Take by mouth daily.        . Naproxen Sodium (ALEVE) 220 MG CAPS Take 1 capsule by mouth daily.        Marland Kitchen  sertraline (ZOLOFT) 100 MG tablet TAKE 1 TABLET BY MOUTH DAILY  90 tablet  2   No current facility-administered medications on file prior to visit.     Review of Systems    Review of Systems  Constitutional: Negative for fever, appetite change, fatigue and unexpected weight change.  ENt pos for post nasal drip /neg for congestion and sinus pain  Eyes: Negative for pain and visual disturbance.  Respiratory: Negative for wheeze  and shortness of breath.   Cardiovascular: Negative for cp or palpitations   neg for PND or orthopnea or pedal edema Gastrointestinal: Negative for nausea, diarrhea and constipation.  Genitourinary: Negative for urgency and frequency.  Skin: Negative for pallor or rash   Neurological: Negative for weakness, light-headedness, numbness and headaches.  Hematological: Negative for adenopathy. Does not bruise/bleed easily.    Psychiatric/Behavioral: Negative for dysphoric mood. The patient is not nervous/anxious.      Objective:   Physical Exam  Constitutional: She appears well-developed and well-nourished. No distress.  obese and well appearing   HENT:  Head: Normocephalic and atraumatic.  Right Ear: External ear normal.  Left Ear: External ear normal.  Nose: Nose normal.  Mouth/Throat: Oropharynx is clear and moist.  Eyes: Conjunctivae and EOM are normal. Pupils are equal, round, and reactive to light. Right eye exhibits no discharge. Left eye exhibits no discharge. No scleral icterus.  Neck: Normal range of motion. Neck supple. No JVD present. No thyromegaly present.  Cardiovascular: Normal rate, regular rhythm, normal heart sounds and intact distal pulses.  Exam reveals no gallop.   Pulmonary/Chest: Effort normal and breath sounds normal. No respiratory distress. She has no wheezes. She has no rales. She exhibits no tenderness.  Abdominal: Soft. Bowel sounds are normal. She exhibits no distension and no mass. There is no tenderness.  Musculoskeletal: She exhibits no edema and no tenderness.  Lymphadenopathy:    She has no cervical adenopathy.  Neurological: She is alert. She has normal reflexes. No cranial nerve deficit. She exhibits normal muscle tone. Coordination normal.  Skin: Skin is warm and dry. No rash noted. No erythema. No pallor.  Psychiatric: She has a normal mood and affect.          Assessment & Plan:

## 2013-01-14 NOTE — Progress Notes (Signed)
Pre-visit discussion using our clinic review tool. No additional management support is needed unless otherwise documented below in the visit note.  

## 2013-01-14 NOTE — Patient Instructions (Signed)
Chest xray now  We will call you with a result  Take your zyrtec every day  Use tessalon as needed  We will make a plan based on results

## 2013-01-15 NOTE — Assessment & Plan Note (Signed)
Most likely post viral or post nasal drip (allergy) related inst to take zyrtec daily Tessalon px  cxr today and update   Consider tx for gerd if no imp

## 2013-06-23 ENCOUNTER — Other Ambulatory Visit: Payer: Self-pay | Admitting: Family Medicine

## 2013-06-23 NOTE — Telephone Encounter (Signed)
No recent/future appts with you, she does have an acute appt with Dr. Lorelei Pont on 07/06/13, please advise

## 2013-06-23 NOTE — Telephone Encounter (Signed)
Can have 3 refills of amox  F/u with me in summer and refill thyroid med until then please

## 2013-06-25 NOTE — Telephone Encounter (Signed)
Rx done, left voicemail letting pt know she is due for a f/u appt with Dr. Glori Bickers this summer and advise her to call office and schedule a f/u

## 2013-07-06 ENCOUNTER — Ambulatory Visit (INDEPENDENT_AMBULATORY_CARE_PROVIDER_SITE_OTHER): Payer: Federal, State, Local not specified - PPO | Admitting: Family Medicine

## 2013-07-06 ENCOUNTER — Encounter: Payer: Self-pay | Admitting: Family Medicine

## 2013-07-06 VITALS — BP 110/76 | HR 70 | Temp 98.1°F | Ht 66.25 in | Wt 198.5 lb

## 2013-07-06 DIAGNOSIS — M722 Plantar fascial fibromatosis: Secondary | ICD-10-CM

## 2013-07-06 NOTE — Patient Instructions (Signed)
Please read handouts on Plantar Fascitis.  STRETCHING and Strengthening program critically important.  Strengthening on foot and calf muscles as seen in handout. Calf raises, 2 legged, then 1 legged. Foot massage with tennis ball. Ice massage.  NEEDS TO BE DONE EVERY DAY  Recommended over the counter insoles. (Spenco or Hapad)  A rigid shoe with good arch support helps: Dansko (great), Keen, Merrell No easily bendable shoes.   

## 2013-07-06 NOTE — Progress Notes (Signed)
[place] [address] Phone: [phone] Fax: [phone]  Patient ID: Janet Douglas MRN: [id number], DOB: 1965-07-12, 48 y.o. Date of Encounter: 07/06/2013  Primary Physician:  Loura Pardon, MD   Chief Complaint: Foot Pain   Subjective:   History of Present Illness:  This 48 y.o. female patient presents with a 4-5 long history of L heel pain. This is notable for worsening pain first thing in the morning when arising and standing after sitting.   L foot - PF, has been there for a couple of months. 4-5 months.  Works at the [employer].  Route.   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  Stretching it out. Frozen water bottle.   Hurts more with taking boots off.   Prior foot or ankle fractures: none Prior operations: none Orthotics or bracing: yes, several types Medications: none PT or home rehab: none Night splints: no Ice massage: yes Ball massage: no  Metatarsal pain: no  The PMH, PSH, Social History, Family History, Medications, and allergies have been reviewed in [facility], and have been updated if relevant.  REVIEW OF SYSTEMS  GEN: No fevers, chills. Nontoxic. Primarily MSK c/o today. MSK: Detailed in the HPI GI: tolerating PO intake without difficulty Neuro: No numbness, parasthesias, or tingling associated. Otherwise the pertinent positives of the ROS are noted above.    Objective:   PHYSICAL EXAM  Blood pressure 110/76, pulse 70, temperature 98.1 F (36.7 C), temperature source Oral, height 5' 6.25" (1.683 m), weight 198 lb 8 oz (90.039 kg), last menstrual period 06/25/2013.  GEN: Well-developed,well-nourished,in no acute distress; alert,appropriate and cooperative throughout examination HEENT: Normocephalic and atraumatic without obvious abnormalities. Ears, externally no deformities PULM: Breathing comfortably in no respiratory distress EXT: No clubbing, cyanosis, or edema PSYCH: Normally interactive. Cooperative during the  interview. Pleasant. Friendly and conversant. Not anxious or depressed appearing. Normal, full affect.  L Echymosis: no Edema: no ROM: full LE B Gait: heel toe, non-antalgic MT pain: no Callus pattern: none Lateral Mall: NT Medial Mall: NT Talus: NT Navicular: NT Calcaneous: NT Metatarsals: NT 5th MT: NT Phalanges: NT Achilles: NT Plantar Fascia: tender, medial along PF. Pain with forced dorsi Fat Pad: NT Peroneals: NT Post Tib: NT Great Toe: Nml motion Ant Drawer: neg Other foot breakdown: none Long arch: preserved Transverse arch: preserved Hindfoot breakdown: none Sensation: intact  Assessment & Plan:   Plantar fascitis: >25 minutes spent in face to face time with patient, >50% spent in counselling or coordination of care: initial conservative care We reviewed anatomy that stretching and rehab are critically important to the treatment of PF. Reviewed footwear. Rigid soles have been shown to help with PF.  Reviewed rehab of stretching and calf raises.  Reviewed rehab from [facility]  Given arch band  Could benefit from a corticosteroid injection if conservative treatment fails.  Follow-up: No Follow-up on file. Unless noted above, the patient is to follow-up if symptoms worsen. Red flags were reviewed with the patient.   Signed,  Maud Deed. Mayson Sterbenz, MD, [facility]   Patient Instructions: Please read handouts on Plantar Fascitis.  STRETCHING and Strengthening program critically important.  Strengthening on foot and calf muscles as seen in handout. Calf raises, 2 legged, then 1 legged. Foot massage with tennis ball. Ice massage.  NEEDS TO BE DONE EVERY DAY  Recommended over the counter insoles. (Spenco or Hapad)  A rigid shoe with good arch support  helps: Dansko (great), Bronwen Betters No easily bendable shoes.   Patient's Medications  New Prescriptions   No medications on file  Previous Medications    ALBUTEROL (PROVENTIL HFA;VENTOLIN HFA) 108 (90 BASE) MCG/ACT INHALER    Inhale 2 puffs into the lungs every 4 (four) hours as needed for wheezing or shortness of breath.   ALPRAZOLAM (XANAX) 0.5 MG TABLET    Take 1 pill 1 hour before procedure   AMOXICILLIN (AMOXIL) 500 MG CAPSULE    TAKE 4 CAPSULES BY MOUTH 1 HOUR BEFORE DENTAL PROCEDURE   BENZONATATE (TESSALON) 200 MG CAPSULE    Take 1 capsule (200 mg total) by mouth 3 (three) times daily as needed for cough.   CETIRIZINE (ZYRTEC) 10 MG TABLET    Take 10 mg by mouth as needed.    DIPHENHYDRAMINE-ACETAMINOPHEN (TYLENOL PM) 25-500 MG TABS    Take 1 tablet by mouth at bedtime as needed.     GUAIFENESIN-CODEINE (ROBITUSSIN AC) 100-10 MG/5ML SYRUP    Take 5-10 mLs by mouth 3 (three) times daily as needed for cough (careful of sedation).   LEVOTHYROXINE (SYNTHROID, LEVOTHROID) 75 MCG TABLET    TAKE 1 TABLET BY MOUTH EVERY DAY   MULTIPLE VITAMINS PO    Take by mouth daily.     NAPROXEN SODIUM (ALEVE) 220 MG CAPS    Take 1 capsule by mouth daily.     SERTRALINE (ZOLOFT) 100 MG TABLET    TAKE 1 TABLET BY MOUTH DAILY  Modified Medications   No medications on file  Discontinued Medications   No medications on file

## 2013-07-06 NOTE — Progress Notes (Signed)
Pre visit review using our clinic review tool, if applicable. No additional management support is needed unless otherwise documented below in the visit note. 

## 2013-07-15 ENCOUNTER — Other Ambulatory Visit: Payer: Self-pay | Admitting: Family Medicine

## 2013-07-15 NOTE — Telephone Encounter (Signed)
Will refill electronically  

## 2013-07-15 NOTE — Telephone Encounter (Signed)
Electronic refill request, please advise  

## 2013-08-05 ENCOUNTER — Ambulatory Visit: Payer: Federal, State, Local not specified - PPO | Admitting: Family Medicine

## 2013-08-26 ENCOUNTER — Encounter: Payer: Self-pay | Admitting: Family Medicine

## 2013-08-26 ENCOUNTER — Ambulatory Visit (INDEPENDENT_AMBULATORY_CARE_PROVIDER_SITE_OTHER): Payer: Federal, State, Local not specified - PPO | Admitting: Family Medicine

## 2013-08-26 VITALS — BP 98/70 | HR 69 | Temp 98.1°F | Ht 66.25 in | Wt 194.5 lb

## 2013-08-26 DIAGNOSIS — F4321 Adjustment disorder with depressed mood: Secondary | ICD-10-CM

## 2013-08-26 DIAGNOSIS — M25539 Pain in unspecified wrist: Secondary | ICD-10-CM

## 2013-08-26 DIAGNOSIS — M25531 Pain in right wrist: Secondary | ICD-10-CM

## 2013-08-26 DIAGNOSIS — E039 Hypothyroidism, unspecified: Secondary | ICD-10-CM

## 2013-08-26 MED ORDER — LEVOTHYROXINE SODIUM 75 MCG PO TABS: ORAL_TABLET | ORAL | Status: DC

## 2013-08-26 MED ORDER — BENZONATATE 200 MG PO CAPS: 200.0000 mg | ORAL_CAPSULE | Freq: Three times a day (TID) | ORAL | Status: DC | PRN

## 2013-08-26 MED ORDER — SERTRALINE HCL 100 MG PO TABS: ORAL_TABLET | ORAL | Status: DC

## 2013-08-26 NOTE — Progress Notes (Signed)
Subjective:    Patient ID: Janet Douglas, female    DOB: [birth year], 48 y.o.   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 Pain in right wrist 08/26/2013  . Cough 01/14/2013  . Left-sided headache 11/28/2012  . History of brain tumor 11/28/2012  . Lump of scalp 06/16/2012  . Skin lesion of face 04/14/2012  . Obesity 06/05/2011  . Allergic rhinitis 05/18/2010  . DEPRESSION, SITUATIONAL 06/15/2009  . ELEVATED BLOOD PRESSURE WITHOUT DIAGNOSIS OF HYPERTENSION 06/15/2009  . PLANTAR FASCIITIS 05/20/2008  . METATARSALGIA 05/06/2008  . OTHER ENTHESOPATHY OF ANKLE AND TARSUS 05/06/2008  . SESAMOIDITIS 11/04/2007  . OBESITY 04/24/2007  . NONORGANIC SLEEP DISORDER UNSPECIFIED 04/24/2007  . POOR CONCENTRATION 09/10/2006  . HYPOTHYROIDISM 09/09/2006  . ANEMIA-NOS 09/09/2006  . TRANSAMINASES, SERUM, ELEVATED 09/09/2006  . ABNORMAL PAP SMEAR 09/09/2006   Past Medical History  Diagnosis Date  . Anemia     NOS  . Hypothyroidism   . Brain tumor     S/P resection with shunt   Past Surgical History  Procedure Laterality Date   . Abdominal US  05/19/1999    Negative  . Tubal ligation    . 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 Abdominal US  08/10    Normal   History  Substance Use Topics  . Smoking status: Never Smoker   . Smokeless tobacco: Never Used  . Alcohol Use: Yes     Comment: Minimal   Family History  Problem Relation Age of Onset  . Thyroid disease Mother   . Thyroid disease Sister   . Heart disease Sister     Low heart rate  . Thyroid disease Brother   . Thyroid disease Brother    Allergies  Allergen Reactions  . Bupropion Hcl     REACTION: itching   Current Outpatient Prescriptions on File Prior to Visit  Medication Sig Dispense Refill  . albuterol (PROVENTIL HFA;VENTOLIN HFA) 108 (90 BASE) MCG/ACT inhaler Inhale 2 puffs into the lungs every 4 (four) hours as needed for wheezing or shortness of breath.  1 Inhaler  1  . ALPRAZolam (XANAX) 0.5 MG tablet Take 1 pill 1 hour before procedure  2 tablet  0  . amoxicillin (AMOXIL) 500 MG capsule TAKE 4 CAPSULES BY MOUTH 1 HOUR BEFORE DENTAL PROCEDURE  4 capsule  2  . cetirizine (ZYRTEC) 10 MG tablet  Take 10 mg by mouth as needed.       . diphenhydramine-acetaminophen (TYLENOL PM) 25-500 MG TABS Take 1 tablet by mouth at bedtime as needed.        Marland Kitchen guaiFENesin-codeine (ROBITUSSIN AC) 100-10 MG/5ML syrup Take 5-10 mLs by mouth 3 (three) times daily as needed for cough (careful of sedation).  120 mL  0  . MULTIPLE VITAMINS PO Take by mouth daily.        . Naproxen Sodium (ALEVE) 220 MG CAPS Take 1 capsule by mouth daily.         No current facility-administered medications on file prior to visit.    Review of Systems    Review of Systems  Constitutional: Negative for fever, appetite change, and unexpected weight change. pos for fatigue/chronic  ENT pos for all rhinitis symptoms Eyes: Negative for pain and visual disturbance.  Respiratory: Negative for cough and shortness of breath.   Cardiovascular: Negative for cp or  palpitations    Gastrointestinal: Negative for nausea, diarrhea and constipation.  Genitourinary: Negative for urgency and frequency.  Skin: Negative for pallor or rash   MSK pos for R wrist pain and swelling  Neurological: Negative for weakness, light-headedness, numbness and headaches.  Hematological: Negative for adenopathy. Does not bruise/bleed easily.  Psychiatric/Behavioral: Negative for dysphoric mood. The patient is not nervous/anxious.      Objective:   Physical Exam  Constitutional: She appears well-developed and well-nourished. No distress.  obese and well appearing   HENT:  Head: Normocephalic and atraumatic.  Mouth/Throat: Oropharynx is clear and moist.  Eyes: Conjunctivae and EOM are normal. Pupils are equal, round, and reactive to light. No scleral icterus.  Neck: Normal range of motion. Neck supple. No JVD present. Carotid bruit is not present. No thyromegaly present.  Cardiovascular: Normal rate, regular rhythm, normal heart sounds and intact distal pulses.   Pulmonary/Chest: Effort normal and breath sounds normal. No respiratory distress. She has no wheezes. She has no rales.  Abdominal: Soft. Bowel sounds are normal. She exhibits no distension and no mass. There is no tenderness.  Musculoskeletal: She exhibits edema and tenderness.       Right wrist: She exhibits tenderness, bony tenderness and swelling. She exhibits normal range of motion, no effusion, no crepitus and no deformity.  R wrist tenderness and fullness (dorsal/ lateral)  Nl rom -some pain on pronation and supination No erythema or warmth Nl sens and perf  Nl grip  Lymphadenopathy:    She has no cervical adenopathy.  Neurological: She is alert. She has normal reflexes. No cranial nerve deficit. She exhibits normal muscle tone. Coordination normal.  Skin: Skin is warm. No rash noted. No erythema.  Psychiatric: She has a normal mood and affect.          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

## 2013-08-26 NOTE — Patient Instructions (Signed)
I think you have tendonitis of the wrist  Use aleve 1-2 pills with food twice daily with meals if it does not bother your stomach  Take it with breakfast and dinner  For sleep if needed take benadryl 25- 50 mg  Use ice/cold pack as often as you can Try wearing the wrist splint with the metal stay in it as much as you can If worse or not improved in 2 weeks make an appt. With Dr Lorelei Pont   Labs today  I'm glad you are doing well  Don't forget your yearly gyn appt

## 2013-08-26 NOTE — Progress Notes (Signed)
Pre visit review using our clinic review tool, if applicable. No additional management support is needed unless otherwise documented below in the visit note. 

## 2013-08-27 ENCOUNTER — Telehealth: Payer: Self-pay | Admitting: Family Medicine

## 2013-08-27 ENCOUNTER — Other Ambulatory Visit: Payer: Self-pay | Admitting: Family Medicine

## 2013-08-27 LAB — TSH: TSH: 4.61 u[IU]/mL — ABNORMAL HIGH (ref 0.35–4.50)

## 2013-08-27 MED ORDER — LEVOTHYROXINE SODIUM 88 MCG PO TABS: 88.0000 ug | ORAL_TABLET | Freq: Every day | ORAL | Status: DC

## 2013-08-27 NOTE — Assessment & Plan Note (Signed)
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

## 2013-08-27 NOTE — Telephone Encounter (Signed)
Inc dose of levothyroxine for high tsh

## 2013-08-27 NOTE — Assessment & Plan Note (Signed)
On zoloft and doing well Has not been able to come off of this in the past  Will continue current dose  Enc exercise and socialization

## 2013-08-27 NOTE — Assessment & Plan Note (Signed)
No clinical changes  Her fatigue is chronic  Lab today for tsh  Refill levothyroxine unless change is needed

## 2013-10-14 ENCOUNTER — Other Ambulatory Visit: Payer: Self-pay | Admitting: Family Medicine

## 2013-10-14 DIAGNOSIS — E039 Hypothyroidism, unspecified: Secondary | ICD-10-CM

## 2013-10-19 ENCOUNTER — Ambulatory Visit (INDEPENDENT_AMBULATORY_CARE_PROVIDER_SITE_OTHER): Payer: Federal, State, Local not specified - PPO

## 2013-10-19 ENCOUNTER — Other Ambulatory Visit (INDEPENDENT_AMBULATORY_CARE_PROVIDER_SITE_OTHER): Payer: Federal, State, Local not specified - PPO

## 2013-10-19 DIAGNOSIS — E039 Hypothyroidism, unspecified: Secondary | ICD-10-CM

## 2013-10-19 DIAGNOSIS — Z23 Encounter for immunization: Secondary | ICD-10-CM

## 2013-10-19 LAB — TSH: TSH: 2.04 u[IU]/mL (ref 0.35–4.50)

## 2013-10-20 ENCOUNTER — Encounter: Payer: Self-pay | Admitting: *Deleted

## 2013-12-24 ENCOUNTER — Other Ambulatory Visit: Payer: Self-pay | Admitting: *Deleted

## 2013-12-24 MED ORDER — LEVOTHYROXINE SODIUM 88 MCG PO TABS: 88.0000 ug | ORAL_TABLET | Freq: Every day | ORAL | Status: DC

## 2014-06-22 ENCOUNTER — Other Ambulatory Visit: Payer: Self-pay | Admitting: Family Medicine

## 2014-06-23 NOTE — Telephone Encounter (Signed)
Please schedule f/u in July and refill until then  

## 2014-06-23 NOTE — Telephone Encounter (Signed)
Electronic refill request, no recent/future appt., last OV was 08/26/13, please advise

## 2014-06-24 NOTE — Telephone Encounter (Signed)
appt scheduled and med refilled 

## 2014-07-12 ENCOUNTER — Ambulatory Visit (INDEPENDENT_AMBULATORY_CARE_PROVIDER_SITE_OTHER): Payer: Federal, State, Local not specified - PPO | Admitting: Family Medicine

## 2014-07-12 ENCOUNTER — Encounter: Payer: Self-pay | Admitting: Family Medicine

## 2014-07-12 VITALS — BP 124/82 | HR 69 | Temp 98.2°F | Ht 66.25 in | Wt 199.0 lb

## 2014-07-12 DIAGNOSIS — M5441 Lumbago with sciatica, right side: Secondary | ICD-10-CM

## 2014-07-12 DIAGNOSIS — M545 Low back pain, unspecified: Secondary | ICD-10-CM | POA: Insufficient documentation

## 2014-07-12 MED ORDER — MELOXICAM 15 MG PO TABS
15.0000 mg | ORAL_TABLET | Freq: Every day | ORAL | Status: DC
Start: 2014-07-12 — End: 2014-08-31

## 2014-07-12 MED ORDER — CYCLOBENZAPRINE HCL 10 MG PO TABS: 10.0000 mg | ORAL_TABLET | Freq: Every evening | ORAL | Status: DC | PRN

## 2014-07-12 NOTE — Progress Notes (Signed)
Subjective:    Patient ID: Janet Douglas, female    DOB: 06/24/1965, 49 y.o.   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 Low back pain 07/12/2014  . Pain in right wrist 08/26/2013  . Cough 01/14/2013  . Left-sided headache 11/28/2012  . History of brain tumor 11/28/2012  . Lump of scalp 06/16/2012  . Skin lesion of face 04/14/2012  . Obesity 06/05/2011  . Allergic rhinitis 05/18/2010  . DEPRESSION, SITUATIONAL 06/15/2009  . ELEVATED BLOOD PRESSURE WITHOUT DIAGNOSIS OF HYPERTENSION 06/15/2009  . PLANTAR FASCIITIS 05/20/2008  . METATARSALGIA 05/06/2008  . OTHER ENTHESOPATHY OF ANKLE AND TARSUS 05/06/2008  . SESAMOIDITIS 11/04/2007  . OBESITY 04/24/2007  . NONORGANIC SLEEP DISORDER UNSPECIFIED 04/24/2007  . POOR CONCENTRATION 09/10/2006  . HYPOTHYROIDISM 09/09/2006  . ANEMIA-NOS 09/09/2006  . TRANSAMINASES, SERUM, ELEVATED 09/09/2006  . ABNORMAL PAP SMEAR 09/09/2006   Past Medical History  Diagnosis Date  . Anemia     NOS  . Hypothyroidism   . Brain tumor     S/P resection with shunt   Past Surgical History  Procedure Laterality Date  . Abdominal US  05/19/1999    Negative  . Tubal ligation    . 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 Abdominal US  08/10    Normal   History  Substance Use Topics  . Smoking status: Never Smoker   . Smokeless tobacco: Never Used  . Alcohol Use: 0.0 oz/week    0 Standard drinks or equivalent per week     Comment: Minimal   Family History  Problem Relation Age of Onset  . Thyroid disease Mother   .  Thyroid disease Sister   . Heart disease Sister     Low heart rate  . Thyroid disease Brother   . Thyroid disease Brother    Allergies  Allergen Reactions  . Bupropion Hcl     REACTION: itching   Current Outpatient Prescriptions on File Prior to Visit  Medication Sig Dispense Refill  . albuterol (PROVENTIL HFA;VENTOLIN HFA) 108 (90 BASE) MCG/ACT inhaler Inhale 2 puffs into the lungs every 4 (four) hours as needed for wheezing or shortness of breath. 1 Inhaler 1  . ALPRAZolam (XANAX) 0.5 MG tablet Take 1 pill 1 hour before procedure 2 tablet 0  . amoxicillin (AMOXIL) 500 MG capsule TAKE 4 CAPSULES BY MOUTH 1 HOUR BEFORE DENTAL PROCEDURE 4 capsule 2  . cetirizine (ZYRTEC) 10 MG tablet Take 10 mg by mouth as needed.     . diphenhydramine-acetaminophen (TYLENOL PM) 25-500 MG TABS Take 1 tablet by mouth at bedtime as needed.      Marland Kitchen levothyroxine (SYNTHROID, LEVOTHROID) 88 MCG tablet TAKE 1 TABLET BY MOUTH DAILY 30 tablet 2  . MULTIPLE VITAMINS PO Take by mouth daily.      . sertraline (ZOLOFT) 100 MG tablet TAKE 1 TABLET BY MOUTH DAILY 90 tablet 3   No current  facility-administered medications on file prior to visit.    Review of Systems Review of Systems  Constitutional: Negative for fever, appetite change, fatigue and unexpected weight change.  Eyes: Negative for pain and visual disturbance.  Respiratory: Negative for cough and shortness of breath.   Cardiovascular: Negative for cp or palpitations    Gastrointestinal: Negative for nausea, diarrhea and constipation.  Genitourinary: Negative for urgency and frequency.  MSK pos for low back pain with rad to R buttock  Skin: Negative for pallor or rash   Neurological: Negative for weakness, light-headedness, numbness and headaches.  Hematological: Negative for adenopathy. Does not bruise/bleed easily.  Psychiatric/Behavioral: Negative for dysphoric mood. The patient is not nervous/anxious.         Objective:   Physical Exam    Constitutional: She appears well-developed and well-nourished. No distress.  obese and well appearing   Pt is standing because it is uncomfortable to sit  Eyes: Conjunctivae and EOM are normal. Pupils are equal, round, and reactive to light.  Neck: Normal range of motion. Neck supple.  Cardiovascular: Normal rate and regular rhythm.   Pulmonary/Chest: Effort normal and breath sounds normal.  Musculoskeletal: She exhibits tenderness. She exhibits no edema.       Lumbar back: She exhibits decreased range of motion, tenderness, bony tenderness and spasm. She exhibits no edema.  Spasm in R lumbar area and piriformis area  Flex 90 deg Ext 10 deg  Some discomfort with lateral bend Nl gait  No focal neuro deficits    Lymphadenopathy:    She has no cervical adenopathy.  Neurological: She is alert. She has normal reflexes. No cranial nerve deficit. She exhibits normal muscle tone. Coordination normal.  Skin: Skin is warm and dry. No rash noted. No erythema.  Psychiatric: She has a normal mood and affect.          Assessment & Plan:   Problem List Items Addressed This Visit    Low back pain - Primary    After strain 2 wk ago - spasm  Enc use of ice/heat/stretching and walking Flexeril for pm use /caution of sedation  Disc symptomatic care - see instructions on AVS  Refilled meloxicam to take with food  Handouts given on back pain and also injury prev   Update if not starting to improve in a week or if worsening        Relevant Medications   meloxicam (MOBIC) 15 MG tablet   cyclobenzaprine (FLEXERIL) 10 MG tablet

## 2014-07-12 NOTE — Progress Notes (Signed)
Pre visit review using our clinic review tool, if applicable. No additional management support is needed unless otherwise documented below in the visit note. 

## 2014-07-12 NOTE — Patient Instructions (Signed)
For low back pain - keep moving and walking  meloxicam daily with food as needed Tylenol as needed Flexeril at night - it may sedate ( use with caution- a muscle relaxer)  Use heat or ice -whatever feels best

## 2014-07-12 NOTE — Assessment & Plan Note (Signed)
After strain 2 wk ago - spasm  Enc use of ice/heat/stretching and walking Flexeril for pm use /caution of sedation  Disc symptomatic care - see instructions on AVS  Refilled meloxicam to take with food  Handouts given on back pain and also injury prev   Update if not starting to improve in a week or if worsening

## 2014-07-15 ENCOUNTER — Other Ambulatory Visit: Payer: Self-pay | Admitting: Family Medicine

## 2014-07-16 NOTE — Telephone Encounter (Signed)
Please refill times 3 so she has refills on file  Thanks

## 2014-07-16 NOTE — Telephone Encounter (Signed)
Electronic refill request, pt has an upcoming dental appt and she always takes the abx before her appt., please advise

## 2014-07-16 NOTE — Telephone Encounter (Signed)
done

## 2014-08-31 ENCOUNTER — Ambulatory Visit (INDEPENDENT_AMBULATORY_CARE_PROVIDER_SITE_OTHER): Payer: Federal, State, Local not specified - PPO | Admitting: Family Medicine

## 2014-08-31 ENCOUNTER — Ambulatory Visit: Payer: Federal, State, Local not specified - PPO | Admitting: Family Medicine

## 2014-08-31 ENCOUNTER — Encounter: Payer: Self-pay | Admitting: Family Medicine

## 2014-08-31 VITALS — BP 118/72 | HR 65 | Temp 98.1°F | Ht 66.25 in | Wt 196.2 lb

## 2014-08-31 DIAGNOSIS — E039 Hypothyroidism, unspecified: Secondary | ICD-10-CM | POA: Diagnosis not present

## 2014-08-31 DIAGNOSIS — D649 Anemia, unspecified: Secondary | ICD-10-CM | POA: Diagnosis not present

## 2014-08-31 DIAGNOSIS — N951 Menopausal and female climacteric states: Secondary | ICD-10-CM

## 2014-08-31 DIAGNOSIS — E669 Obesity, unspecified: Secondary | ICD-10-CM

## 2014-08-31 DIAGNOSIS — R739 Hyperglycemia, unspecified: Secondary | ICD-10-CM | POA: Diagnosis not present

## 2014-08-31 DIAGNOSIS — R5382 Chronic fatigue, unspecified: Secondary | ICD-10-CM | POA: Diagnosis not present

## 2014-08-31 DIAGNOSIS — Z13220 Encounter for screening for lipoid disorders: Secondary | ICD-10-CM | POA: Insufficient documentation

## 2014-08-31 DIAGNOSIS — R5383 Other fatigue: Secondary | ICD-10-CM | POA: Insufficient documentation

## 2014-08-31 NOTE — Progress Notes (Signed)
Subjective:    Patient ID: Janet Douglas, female    DOB: October 08, 1965, 49 y.o.   MRN: [id number]  HPI Here for f/u of chronic health problems   Has been feeling lousy lately  Working in the heat- tries to drink a lot of water  Vomited at work several times Thursday  Just feeling blah and tired- sleeps a lot more than she used to   Ankles swollen / feels bloated   She needs more supportive shoes for work at the [employer]   Is perimenopausal  LMP 6/.27 Spotting this week  5/16 was the period before that  No hot flashes or night sweats      Wt is down 3 lb with bmi of 31  Hypothyroidism Lab Results  Component Value Date   TSH 2.04 10/19/2013    Due for tsh  Has taken med with milk occ  Does not miss doses  More ankle edema lately - ? If related   Hx of anemia in the past /susp iron def Lab Results  Component Value Date   WBC 7.9 11/21/2010   HGB 11.3* 11/21/2010   HCT 35.8* 11/21/2010   MCV 67.6 Repeated and verified X2.* 11/21/2010   PLT 185.0 11/21/2010   forgets to take her iron   Mood- forgets her zoloft  zoloft -works well when she takes it  Has not needed xanax   Takes aleve for joint pain as needed - no GI upset    Patient Active Problem List   Diagnosis Date Noted  . Low back pain 07/12/2014  . Pain in right wrist 08/26/2013  . Cough 01/14/2013  . Left-sided headache 11/28/2012  . History of brain tumor 11/28/2012  . Lump of scalp 06/16/2012  . Skin lesion of face 04/14/2012  . Obesity 06/05/2011  . Allergic rhinitis 05/18/2010  . DEPRESSION, SITUATIONAL 06/15/2009  . ELEVATED BLOOD PRESSURE WITHOUT DIAGNOSIS OF HYPERTENSION 06/15/2009  . PLANTAR FASCIITIS 05/20/2008  . METATARSALGIA 05/06/2008  . OTHER ENTHESOPATHY OF ANKLE AND TARSUS 05/06/2008  . SESAMOIDITIS 11/04/2007  . NONORGANIC SLEEP DISORDER UNSPECIFIED 04/24/2007  . POOR CONCENTRATION 09/10/2006  . Hypothyroidism 09/09/2006  . Anemia 09/09/2006  . TRANSAMINASES, SERUM,  ELEVATED 09/09/2006  . ABNORMAL PAP SMEAR 09/09/2006   Past Medical History  Diagnosis Date  . Anemia     NOS  . Hypothyroidism   . Brain tumor     S/P resection with shunt   Past Surgical History  Procedure Laterality Date  . Abdominal US  05/19/1999    Negative  . Tubal ligation    . 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 Abdominal US  08/10    Normal   History  Substance Use Topics  . Smoking status: Never Smoker   . Smokeless tobacco: Never Used  . Alcohol Use: 0.0 oz/week    0 Standard drinks or equivalent per week     Comment: Minimal   Family History  Problem Relation Age of Onset  . Thyroid disease Mother   . Thyroid disease Sister   . Heart disease Sister     Low heart rate  . Thyroid disease Brother   . Thyroid disease Brother    Allergies  Allergen Reactions  . Bupropion Hcl     REACTION: itching   Current Outpatient Prescriptions on File Prior to Visit  Medication Sig Dispense Refill  . albuterol (PROVENTIL HFA;VENTOLIN HFA) 108 (90 BASE) MCG/ACT inhaler Inhale 2 puffs into the lungs  every 4 (four) hours as needed for wheezing or shortness of breath. 1 Inhaler 1  . ALPRAZolam (XANAX) 0.5 MG tablet Take 1 pill 1 hour before procedure 2 tablet 0  . amoxicillin (AMOXIL) 500 MG capsule TAKE 4 CAPSULES 1 HOUR BEFORE DENTAL APPOINTMENT 4 capsule 2  . cetirizine (ZYRTEC) 10 MG tablet Take 10 mg by mouth as needed.     . cyclobenzaprine (FLEXERIL) 10 MG tablet Take 1 tablet (10 mg total) by mouth at bedtime as needed for muscle spasms (for back pain , careful of sedation). 30 tablet 0  . levothyroxine (SYNTHROID, LEVOTHROID) 88 MCG tablet TAKE 1 TABLET BY MOUTH DAILY 30 tablet 2  . MULTIPLE VITAMINS PO Take by mouth daily.      . sertraline (ZOLOFT) 100 MG tablet TAKE 1 TABLET BY MOUTH DAILY 90 tablet 3   No current facility-administered medications on file prior to visit.     Review of Systems Review of Systems  Constitutional: Negative for  fever, appetite change,  and unexpected weight change.  Eyes: Negative for pain and visual disturbance.  Respiratory: Negative for cough and shortness of breath.   Cardiovascular: Negative for cp or palpitations    Gastrointestinal: Negative for nausea, diarrhea and constipation.  Genitourinary: Negative for urgency and frequency.  Skin: Negative for pallor or rash   Neurological: Negative for weakness, light-headedness, numbness and headaches.  Hematological: Negative for adenopathy. Does not bruise/bleed easily.  Psychiatric/Behavioral: Negative for dysphoric mood. The patient is not nervous/anxious.         Objective:   Physical Exam  Constitutional: She is oriented to person, place, and time. She appears well-developed and well-nourished. No distress.  obese and well appearing   HENT:  Head: Normocephalic and atraumatic.  Mouth/Throat: Oropharynx is clear and moist. No oropharyngeal exudate.  No sinus tenderness No temporal tenderness  No TMJ tenderness  Eyes: Conjunctivae and EOM are normal. Pupils are equal, round, and reactive to light. Right eye exhibits no discharge. Left eye exhibits no discharge. No scleral icterus.  No nystagmus  Neck: Normal range of motion and full passive range of motion without pain. Neck supple. No JVD present. Carotid bruit is not present. No tracheal deviation present. No thyromegaly present.  Cardiovascular: Normal rate, regular rhythm, normal heart sounds and intact distal pulses.   No murmur heard. Pulmonary/Chest: Effort normal and breath sounds normal. No respiratory distress. She has no wheezes. She has no rales.  Abdominal: Soft. Bowel sounds are normal. She exhibits no distension and no mass. There is no tenderness.  Musculoskeletal: She exhibits no edema.  Lymphadenopathy:    She has no cervical adenopathy.  Neurological: She is alert and oriented to person, place, and time. She has normal strength and normal reflexes. She displays no  atrophy and no tremor. No cranial nerve deficit or sensory deficit. She exhibits normal muscle tone. She displays a negative Romberg sign. Coordination and gait normal.  No focal cerebellar signs  No tremor    Skin: Skin is warm and dry. No rash noted. No pallor.  Psychiatric: She has a normal mood and affect. Her behavior is normal. Thought content normal.          Assessment & Plan:   Problem List Items Addressed This Visit    Anemia    Chronic iron def Pt is not compliant with iron  Cbc and ferritin today Is fatigued       Relevant Orders   CBC with Differential/Platelet (Completed)  Ferritin (Completed)   Fatigue    Likely multifactorial  Heat/perimenopause/thyroid/anemia/mood (noncompl with zoloft) likely all play a role Disc alarms to remind her to take medication  Lab today      Relevant Orders   CBC with Differential/Platelet (Completed)   Comprehensive metabolic panel (Completed)   TSH (Completed)   Vitamin B12 (Completed)   Hyperglycemia    Past fasting glucose of 107 Obese Check A1C today Has been fatigued       Relevant Orders   Hemoglobin A1c (Completed)   Hypothyroidism - Primary    tsh today  Perimenopausal Some fatigue and ankle edema lately  Reassuring exam      Relevant Orders   TSH (Completed)   Obesity    Discussed how this problem influences overall health and the risks it imposes  Reviewed plan for weight loss with lower calorie diet (via better food choices and also portion control or program like weight watchers) and exercise building up to or more than 30 minutes 5 days per week including some aerobic activity   Likely contributes to ankle edema       Perimenopause    Rev expectations May add to fatigue  May also affect thyroid tsh today      Screening for lipoid disorders    In hypothyroid pt  Lab today  Diet is fair       Relevant Orders   Lipid panel (Completed)

## 2014-08-31 NOTE — Assessment & Plan Note (Signed)
Past fasting glucose of 107 Obese Check A1C today Has been fatigued

## 2014-08-31 NOTE — Assessment & Plan Note (Signed)
tsh today  Perimenopausal Some fatigue and ankle edema lately  Reassuring exam

## 2014-08-31 NOTE — Assessment & Plan Note (Signed)
Chronic iron def Pt is not compliant with iron  Cbc and ferritin today Is fatigued

## 2014-08-31 NOTE — Patient Instructions (Signed)
Set an alarm daily to remind you to take your medicine  Eat a healthy diet and stay active  Stay hydrated at work  For swelling , avoid excess salt and aleve  Fatigue is likely multifactorial- work/heat/perimenopause/ medicine compliance/thyroid /anemia   Lab today

## 2014-08-31 NOTE — Assessment & Plan Note (Signed)
Discussed how this problem influences overall health and the risks it imposes  Reviewed plan for weight loss with lower calorie diet (via better food choices and also portion control or program like weight watchers) and exercise building up to or more than 30 minutes 5 days per week including some aerobic activity   Likely contributes to ankle edema

## 2014-08-31 NOTE — Progress Notes (Signed)
Pre visit review using our clinic review tool, if applicable. No additional management support is needed unless otherwise documented below in the visit note. 

## 2014-08-31 NOTE — Assessment & Plan Note (Signed)
Likely multifactorial  Heat/perimenopause/thyroid/anemia/mood (noncompl with zoloft) likely all play a role Disc alarms to remind her to take medication  Lab today

## 2014-08-31 NOTE — Assessment & Plan Note (Signed)
Rev expectations May add to fatigue  May also affect thyroid tsh today

## 2014-08-31 NOTE — Assessment & Plan Note (Signed)
In hypothyroid pt  Lab today  Diet is fair

## 2014-09-01 LAB — CBC WITH DIFFERENTIAL/PLATELET
BASOS PCT: 1 % (ref 0.0–3.0)
Basophils Absolute: 0.1 10*3/uL (ref 0.0–0.1)
EOS ABS: 0.1 10*3/uL (ref 0.0–0.7)
Eosinophils Relative: 1.2 % (ref 0.0–5.0)
HEMATOCRIT: 35.4 % — AB (ref 36.0–46.0)
Hemoglobin: 11.5 g/dL — ABNORMAL LOW (ref 12.0–15.0)
LYMPHS ABS: 1.9 10*3/uL (ref 0.7–4.0)
Lymphocytes Relative: 18 % (ref 12.0–46.0)
MCHC: 32.5 g/dL (ref 30.0–36.0)
MCV: 66.6 fl — ABNORMAL LOW (ref 78.0–100.0)
MONO ABS: 0.5 10*3/uL (ref 0.1–1.0)
MONOS PCT: 4.8 % (ref 3.0–12.0)
NEUTROS PCT: 75 % (ref 43.0–77.0)
Neutro Abs: 8 10*3/uL — ABNORMAL HIGH (ref 1.4–7.7)
Platelets: 208 10*3/uL (ref 150.0–400.0)
RBC: 5.32 Mil/uL — ABNORMAL HIGH (ref 3.87–5.11)
RDW: 14.6 % (ref 11.5–15.5)
WBC: 10.6 10*3/uL — ABNORMAL HIGH (ref 4.0–10.5)

## 2014-09-01 LAB — COMPREHENSIVE METABOLIC PANEL
ALBUMIN: 4.3 g/dL (ref 3.5–5.2)
ALT: 13 U/L (ref 0–35)
AST: 17 U/L (ref 0–37)
Alkaline Phosphatase: 58 U/L (ref 39–117)
BUN: 18 mg/dL (ref 6–23)
CALCIUM: 9.4 mg/dL (ref 8.4–10.5)
CHLORIDE: 104 meq/L (ref 96–112)
CO2: 26 mEq/L (ref 19–32)
Creatinine, Ser: 0.87 mg/dL (ref 0.40–1.20)
GFR: 73.42 mL/min (ref >60.00)
Glucose, Bld: 80 mg/dL (ref 70–99)
Potassium: 4 mEq/L (ref 3.5–5.1)
SODIUM: 138 meq/L (ref 135–145)
TOTAL PROTEIN: 7.4 g/dL (ref 6.0–8.3)
Total Bilirubin: 0.4 mg/dL (ref 0.2–1.2)

## 2014-09-01 LAB — HEMOGLOBIN A1C: HEMOGLOBIN A1C: 5.5 % (ref 4.6–6.5)

## 2014-09-01 LAB — FERRITIN: Ferritin: 49.2 ng/mL (ref 10.0–291.0)

## 2014-09-01 LAB — TSH: TSH: 2.65 u[IU]/mL (ref 0.35–4.50)

## 2014-09-01 LAB — VITAMIN B12: Vitamin B-12: 513 pg/mL (ref 211–911)

## 2014-09-01 LAB — LIPID PANEL
Cholesterol: 161 mg/dL (ref 0–200)
HDL: 40.2 mg/dL (ref >39.00)
NonHDL: 120.8
Total CHOL/HDL Ratio: 4
Triglycerides: 281 mg/dL — ABNORMAL HIGH (ref 0.0–149.0)
VLDL: 56.2 mg/dL — ABNORMAL HIGH (ref 0.0–40.0)

## 2014-09-01 LAB — LDL CHOLESTEROL, DIRECT: Direct LDL: 99 mg/dL

## 2014-09-02 ENCOUNTER — Encounter: Payer: Self-pay | Admitting: *Deleted

## 2014-09-03 ENCOUNTER — Ambulatory Visit: Payer: Federal, State, Local not specified - PPO | Admitting: Family Medicine

## 2014-11-02 ENCOUNTER — Other Ambulatory Visit: Payer: Self-pay | Admitting: Family Medicine

## 2014-11-06 ENCOUNTER — Other Ambulatory Visit: Payer: Self-pay | Admitting: Family Medicine

## 2014-12-08 ENCOUNTER — Encounter: Payer: Self-pay | Admitting: Family Medicine

## 2014-12-08 ENCOUNTER — Ambulatory Visit (INDEPENDENT_AMBULATORY_CARE_PROVIDER_SITE_OTHER): Payer: Federal, State, Local not specified - PPO | Admitting: Family Medicine

## 2014-12-08 VITALS — BP 124/78 | HR 69 | Temp 98.1°F | Ht 66.25 in | Wt 193.5 lb

## 2014-12-08 DIAGNOSIS — H6980 Other specified disorders of Eustachian tube, unspecified ear: Secondary | ICD-10-CM | POA: Insufficient documentation

## 2014-12-08 DIAGNOSIS — M25512 Pain in left shoulder: Secondary | ICD-10-CM

## 2014-12-08 DIAGNOSIS — H6982 Other specified disorders of Eustachian tube, left ear: Secondary | ICD-10-CM | POA: Diagnosis not present

## 2014-12-08 MED ORDER — FLUTICASONE PROPIONATE 50 MCG/ACT NA SUSP: 2.0000 | Freq: Every day | NASAL | Status: DC

## 2014-12-08 NOTE — Progress Notes (Signed)
Pre visit review using our clinic review tool, if applicable. No additional management support is needed unless otherwise documented below in the visit note. 

## 2014-12-08 NOTE — Patient Instructions (Signed)
Try flonase for ear discomfort  If no improvement in the next 2 weeks please let me know  Make an appointment with Dr Lorelei Pont at check out - (for shoulder)

## 2014-12-08 NOTE — Progress Notes (Signed)
Subjective:    Patient ID: Janet Douglas, female    DOB: [birth year], 49 y.o.   MRN: [id number]  HPI Here with L ear pain  Aching  2 weeks  Today also L sided sinus congestion and pressure  No fever Some body aches   No colored nasal d/c -pretty dry overall No cough   Also having shoulder pain L Comes and goes  She works with her arm -post office  Painful to abduct it past midline Taking aleve   Patient Active Problem List   Diagnosis Date Noted  . Hyperglycemia 08/31/2014  . Fatigue 08/31/2014  . Perimenopause 08/31/2014  . Screening for lipoid disorders 08/31/2014  . Low back pain 07/12/2014  . Pain in right wrist 08/26/2013  . Cough 01/14/2013  . Left-sided headache 11/28/2012  . History of brain tumor 11/28/2012  . Lump of scalp 06/16/2012  . Skin lesion of face 04/14/2012  . Obesity 06/05/2011  . Allergic rhinitis 05/18/2010  . DEPRESSION, SITUATIONAL 06/15/2009  . ELEVATED BLOOD PRESSURE WITHOUT DIAGNOSIS OF HYPERTENSION 06/15/2009  . PLANTAR FASCIITIS 05/20/2008  . METATARSALGIA 05/06/2008  . OTHER ENTHESOPATHY OF ANKLE AND TARSUS 05/06/2008  . SESAMOIDITIS 11/04/2007  . NONORGANIC SLEEP DISORDER UNSPECIFIED 04/24/2007  . POOR CONCENTRATION 09/10/2006  . Hypothyroidism 09/09/2006  . Anemia 09/09/2006  . TRANSAMINASES, SERUM, ELEVATED 09/09/2006  . ABNORMAL PAP SMEAR 09/09/2006   Past Medical History  Diagnosis Date  . Anemia     NOS  . Hypothyroidism   . Brain tumor ([place])     S/P resection with shunt   Past Surgical History  Procedure Laterality Date  . Abdominal US  05/19/1999    Negative  . Tubal ligation    . 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 Abdominal US  08/10    Normal   Social History  Substance Use Topics  . Smoking status: Never Smoker   . Smokeless tobacco: Never Used  . Alcohol Use: 0.0 oz/week    0 Standard drinks or equivalent per week     Comment: Minimal   Family History  Problem Relation Age of Onset    . Thyroid disease Mother   . Thyroid disease Sister   . Heart disease Sister     Low heart rate  . Thyroid disease Brother   . Thyroid disease Brother    Allergies  Allergen Reactions  . Bupropion Hcl     REACTION: itching   Current Outpatient Prescriptions on File Prior to Visit  Medication Sig Dispense Refill  . albuterol (PROVENTIL HFA;VENTOLIN HFA) 108 (90 BASE) MCG/ACT inhaler Inhale 2 puffs into the lungs every 4 (four) hours as needed for wheezing or shortness of breath. 1 Inhaler 1  . ALPRAZolam (XANAX) 0.5 MG tablet Take 1 pill 1 hour before procedure 2 tablet 0  . amoxicillin (AMOXIL) 500 MG capsule TAKE 4 CAPSULES 1 HOUR BEFORE DENTAL APPOINTMENT 4 capsule 2  . cetirizine (ZYRTEC) 10 MG tablet Take 10 mg by mouth as needed.     . cyclobenzaprine (FLEXERIL) 10 MG tablet Take 1 tablet (10 mg total) by mouth at bedtime as needed for muscle spasms (for back pain , careful of sedation). 30 tablet 0  . levothyroxine (SYNTHROID, LEVOTHROID) 88 MCG tablet TAKE 1 TABLET BY MOUTH DAILY 30 tablet 9  . MULTIPLE VITAMINS PO Take by mouth daily.      . Naproxen Sod-Diphenhydramine (ALEVE PM PO) Take 1 tablet by mouth daily as needed.    Marland Kitchen  sertraline (ZOLOFT) 100 MG tablet TAKE 1 TABLET BY MOUTH DAILY 90 tablet 3   No current facility-administered medications on file prior to visit.     Review of Systems    Review of Systems  Constitutional: Negative for fever, appetite change, fatigue and unexpected weight change.  Eyes: Negative for pain and visual disturbance.  ENT neg for ST or ear drainage  Respiratory: Negative for cough and shortness of breath.   Cardiovascular: Negative for cp or palpitations    Gastrointestinal: Negative for nausea, diarrhea and constipation.  Genitourinary: Negative for urgency and frequency.  Skin: Negative for pallor or rash   Neurological: Negative for weakness, light-headedness, numbness and headaches.  Hematological: Negative for adenopathy. Does  not bruise/bleed easily.  Psychiatric/Behavioral: Negative for dysphoric mood. The patient is not nervous/anxious.      Objective:   Physical Exam  Constitutional: She appears well-developed and well-nourished. No distress.  obese and well appearing   HENT:  Head: Normocephalic and atraumatic.  Mouth/Throat: Oropharynx is clear and moist.  Nares are boggy No sinus tenderness L TM is retracted with some scarring  R TM is dull Clear rhinorrhea and post nasal drip   Eyes: Conjunctivae and EOM are normal. Pupils are equal, round, and reactive to light. Right eye exhibits no discharge. Left eye exhibits no discharge.  Neck: Normal range of motion. Neck supple.  Cardiovascular: Normal rate and normal heart sounds.   Pulmonary/Chest: Effort normal and breath sounds normal. No respiratory distress. She has no wheezes. She has no rales. She exhibits no tenderness.  Musculoskeletal: She exhibits tenderness.  Cannot abduct L shoulder past 90 deg without pain  Some tenderness-acromion  Pos hawkings test  Pain on ext rotation  Nl int rotation  Lymphadenopathy:    She has no cervical adenopathy.  Neurological: She is alert.  Skin: Skin is warm and dry. No rash noted.  Psychiatric: She has a normal mood and affect.          Assessment & Plan:   Problem List Items Addressed This Visit      Nervous and Auditory   ETD (eustachian tube dysfunction) - Primary    Suspect allergy related  flonase px - to use daily Update if worse/pain/ or fever --s/s of om discussed         Other   Left shoulder pain    Suspect bursitis  Ref to Dr Lorelei Pont

## 2014-12-09 NOTE — Assessment & Plan Note (Signed)
Suspect bursitis  Ref to Dr Lorelei Pont

## 2014-12-09 NOTE — Assessment & Plan Note (Signed)
Suspect allergy related  flonase px - to use daily Update if worse/pain/ or fever --s/s of om discussed

## 2014-12-15 ENCOUNTER — Ambulatory Visit: Payer: Federal, State, Local not specified - PPO | Admitting: Family Medicine

## 2014-12-22 ENCOUNTER — Ambulatory Visit (INDEPENDENT_AMBULATORY_CARE_PROVIDER_SITE_OTHER): Payer: Federal, State, Local not specified - PPO | Admitting: Family Medicine

## 2014-12-22 ENCOUNTER — Encounter: Payer: Self-pay | Admitting: Family Medicine

## 2014-12-22 VITALS — BP 120/80 | HR 71 | Temp 98.5°F | Ht 66.25 in | Wt 196.5 lb

## 2014-12-22 DIAGNOSIS — H6982 Other specified disorders of Eustachian tube, left ear: Secondary | ICD-10-CM

## 2014-12-22 DIAGNOSIS — M7542 Impingement syndrome of left shoulder: Secondary | ICD-10-CM

## 2014-12-22 NOTE — Patient Instructions (Signed)
Eustachian Tube Dysfunction: There is a tube that connects between the sinuses and behind the ear called the "eustachian tube." Sometimes when you have allergies, a cold, or nasal congestion for any reason this tube can get blocked and pressure cannot equalize in your ears. (Like if you swim in deep water) This can also trap fluid behind the ear and give you a full, pressure-like sensation that is uncomfortable, but it is not an ear infection.  Recommendations: Afrin Nasal Spray: 2 sprays twice a day for a maximum of 3-4 days (longer than this and your nose gets addicted, and you have rebound swelling that makes it worse.) Sudafed: Either pseudoephedrine or phenylephrine. As directed on box. (Not is heart problems or high blood pressure) Anti-Histamine: Allegra, Zyrtec, or Claritin. All over the counter now and once a day.  Nasal steroid. Nasacort is over the counter now. About 10 prescription ones exist.  If you develop fever > 100.4, then things can change fluid behind the ear does increase your risk of developing an ear infection.  

## 2014-12-22 NOTE — Progress Notes (Signed)
Dr. Frederico Hamman T. Jaela Yepez, MD, [facility] [place], [address] Phone: [phone] Fax: [phone]  12/22/2014  Patient: Janet Douglas, MRN: [id number], DOB: Aug 09, 1965, 49 y.o.  Primary Physician:  Loura Pardon, MD   Chief Complaint  Patient presents with  . Shoulder Pain    Left  . Ear Pain    Left   Subjective:   Janet Douglas is a 49 y.o. very pleasant female patient who presents with the following:  Left ear. Had an earache last week and balance is off some. Yest really aching.   L shoulder, now has a lot more mail to deliver.   The patient noted above presents with shoulder pain that has been ongoing for few weeks.  there is no history of trauma or accident. The patient denies neck pain or radicular symptoms. Denies dislocation, subluxation, separation of the shoulder. The patient does complain of pain in the overhead plane.  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  Patient Active Problem List   Diagnosis Date Noted  . ETD (eustachian tube dysfunction) 12/08/2014  . Left shoulder pain 12/08/2014  . Hyperglycemia 08/31/2014  . Fatigue 08/31/2014  . Perimenopause 08/31/2014  . Screening for lipoid disorders 08/31/2014  . Low back pain 07/12/2014  . Pain in right wrist 08/26/2013  . Cough 01/14/2013  . Left-sided headache 11/28/2012  . History of brain tumor 11/28/2012  . Lump of scalp 06/16/2012  . Skin lesion of face 04/14/2012  . Obesity 06/05/2011  . Allergic rhinitis 05/18/2010  . DEPRESSION, SITUATIONAL 06/15/2009  . ELEVATED BLOOD PRESSURE WITHOUT DIAGNOSIS OF HYPERTENSION 06/15/2009  . PLANTAR  FASCIITIS 05/20/2008  . METATARSALGIA 05/06/2008  . OTHER ENTHESOPATHY OF ANKLE AND TARSUS 05/06/2008  . SESAMOIDITIS 11/04/2007  . NONORGANIC SLEEP DISORDER UNSPECIFIED 04/24/2007  . POOR CONCENTRATION 09/10/2006  . Hypothyroidism 09/09/2006  . Anemia 09/09/2006  . TRANSAMINASES, SERUM, ELEVATED 09/09/2006  . ABNORMAL PAP SMEAR 09/09/2006    Past Medical History  Diagnosis Date  . Anemia     NOS  . Hypothyroidism   . Brain tumor ([facility])     S/P resection with shunt    Past Surgical History  Procedure Laterality Date  . Abdominal US  05/19/1999    Negative  . Tubal ligation    . 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 Abdominal US  08/10    Normal    Social History   Social History  . Marital Status: Married    Spouse Name: N/A  . Number of Children: 1  . Years of Education: N/A   Occupational History  . Mail Carrier [employer]   Social History Main Topics  . Smoking status: Never Smoker   . Smokeless tobacco: Never Used  . Alcohol Use: 0.0 oz/week    0 Standard drinks or equivalent per week     Comment: Minimal  . Drug Use: No  . Sexual Activity: Not on file   Other Topics Concern  . Not on file   Social History Narrative   One son, one step-son.   From [place].    Family History  Problem Relation Age of Onset  .  Thyroid disease Mother   . Thyroid disease Sister   . Heart disease Sister     Low heart rate  . Thyroid disease Brother   . Thyroid disease Brother     Allergies  Allergen Reactions  . Bupropion Hcl     REACTION: itching    Medication list reviewed and updated in full in [facility].  GEN: No fevers, chills. Nontoxic. Primarily MSK c/o today. MSK: Detailed in the HPI GI: tolerating PO intake without difficulty Neuro: No numbness, parasthesias, or tingling associated. Otherwise the pertinent positives of the ROS are noted above.   Objective:   BP 120/80 mmHg  Pulse 71  Temp(Src) 98.5 F (36.9 C) (Oral)   Ht 5' 6.25" (1.683 m)  Wt 196 lb 8 oz (89.132 kg)  BMI 31.47 kg/m2  LMP 12/08/2014   GEN: Well-developed,well-nourished,in no acute distress; alert,appropriate and cooperative throughout examination HEENT: Normocephalic and atraumatic without obvious abnormalities. Ears, externally no deformities. TM B serous fluid. PULM: Breathing comfortably in no respiratory distress EXT: No clubbing, cyanosis, or edema PSYCH: Normally interactive. Cooperative during the interview. Pleasant. Friendly and conversant. Not anxious or depressed appearing. Normal, full affect.  Shoulder: L Inspection: No muscle wasting or winging Ecchymosis/edema: neg  AC joint, scapula, clavicle: NT Cervical spine: NT, full ROM Spurling's: neg Abduction: full, 5/5 Flexion: full, 5/5 IR, full, lift-off: 5/5 ER at neutral: full, 5/5 AC crossover: neg Neer: pos Hawkins: pos Drop Test: neg Empty Can: pos Supraspinatus insertion: mild-mod T Bicipital groove: NT Speed's: neg Yergason's: neg Sulcus sign: neg Scapular dyskinesis: none C5-T1 intact  Neuro: Sensation intact Grip 5/5   Radiology: No results found.   Assessment and Plan:   Impingement syndrome of left shoulder - Plan: Ambulatory referral to Physical Therapy  ETD (eustachian tube dysfunction), left  Refer to the patient instructions sections for details of plan shared with patient.   Classic impingement, mild now. RTC and scap stabilization and conservative care more likely all that is needed.  Follow-up: 6-8 weeks if not improving  Patient Instructions  Eustachian Tube Dysfunction: There is a tube that connects between the sinuses and behind the ear called the "eustachian tube." Sometimes when you have allergies, a cold, or nasal congestion for any reason this tube can get blocked and pressure cannot equalize in your ears. (Like if you swim in deep water) This can also trap fluid behind the ear and give you a full, pressure-like sensation  that is uncomfortable, but it is not an ear infection.  Recommendations: Afrin Nasal Spray: 2 sprays twice a day for a maximum of 3-4 days (longer than this and your nose gets addicted, and you have rebound swelling that makes it worse.) Sudafed: Either pseudoephedrine or phenylephrine. As directed on box. (Not is heart problems or high blood pressure) Anti-Histamine: Allegra, Zyrtec, or Claritin. All over the counter now and once a day.  Nasal steroid. Nasacort is over the counter now. About 10 prescription ones exist.  If you develop fever > 100.4, then things can change fluid behind the ear does increase your risk of developing an ear infection.      Orders Placed This Encounter  Procedures  . Ambulatory referral to Physical Therapy    Signed,  Frederico Hamman T. Adrea Sherpa, MD   Patient's Medications  New Prescriptions   No medications on file  Previous Medications   ALBUTEROL (PROVENTIL HFA;VENTOLIN HFA) 108 (90 BASE) MCG/ACT INHALER    Inhale 2 puffs into the lungs every 4 (four)  hours as needed for wheezing or shortness of breath.   ALPRAZOLAM (XANAX) 0.5 MG TABLET    Take 1 pill 1 hour before procedure   AMOXICILLIN (AMOXIL) 500 MG CAPSULE    TAKE 4 CAPSULES 1 HOUR BEFORE DENTAL APPOINTMENT   CETIRIZINE (ZYRTEC) 10 MG TABLET    Take 10 mg by mouth as needed.    CYCLOBENZAPRINE (FLEXERIL) 10 MG TABLET    Take 1 tablet (10 mg total) by mouth at bedtime as needed for muscle spasms (for back pain , careful of sedation).   FERROUS SULFATE (IRON) 325 (65 FE) MG TABS    Take 1 tablet by mouth daily as needed.   FLUTICASONE (FLONASE) 50 MCG/ACT NASAL SPRAY    Place 2 sprays into both nostrils daily.   LEVOTHYROXINE (SYNTHROID, LEVOTHROID) 88 MCG TABLET    TAKE 1 TABLET BY MOUTH DAILY   MULTIPLE VITAMINS PO    Take by mouth daily.     NAPROXEN SOD-DIPHENHYDRAMINE (ALEVE PM PO)    Take 1 tablet by mouth daily as needed.   SERTRALINE (ZOLOFT) 100 MG TABLET    TAKE 1 TABLET BY MOUTH DAILY    Modified Medications   No medications on file  Discontinued Medications   No medications on file

## 2014-12-22 NOTE — Progress Notes (Signed)
Pre visit review using our clinic review tool, if applicable. No additional management support is needed unless otherwise documented below in the visit note. 

## 2015-02-14 ENCOUNTER — Other Ambulatory Visit: Payer: Self-pay | Admitting: Family Medicine

## 2015-06-14 ENCOUNTER — Other Ambulatory Visit: Payer: Self-pay | Admitting: Physician Assistant

## 2015-06-14 ENCOUNTER — Ambulatory Visit (INDEPENDENT_AMBULATORY_CARE_PROVIDER_SITE_OTHER): Admitting: Physician Assistant

## 2015-06-14 VITALS — BP 128/80 | HR 74 | Temp 98.2°F | Resp 18 | Wt 196.0 lb

## 2015-06-14 DIAGNOSIS — S29012A Strain of muscle and tendon of back wall of thorax, initial encounter: Secondary | ICD-10-CM | POA: Diagnosis not present

## 2015-06-14 MED ORDER — CYCLOBENZAPRINE HCL 5 MG PO TABS: 5.0000 mg | ORAL_TABLET | Freq: Three times a day (TID) | ORAL | Status: DC | PRN

## 2015-06-14 MED ORDER — MELOXICAM 15 MG PO TABS: 15.0000 mg | ORAL_TABLET | Freq: Every day | ORAL | Status: DC

## 2015-06-14 NOTE — Patient Instructions (Addendum)
IF you received an x-ray today, you will receive an invoice from [facility]. Please contact [facility] at [phone] with questions or concerns regarding your invoice.   IF you received labwork today, you will receive an invoice from [employer]. Please contact [facility] at [phone] with questions or concerns regarding your invoice.   Our billing staff will not be able to assist you with questions regarding bills from these companies.  You will be contacted with the lab results as soon as they are available. The fastest way to get your results is to activate your My Chart account. Instructions are located on the last page of this paperwork. If you have not heard from [place] regarding the results in 2 weeks, please contact this office.    Please ice the back area 3 times per day.  I would like you to perform three pictured stretches, three times per day.  If it says how many reps, you will do it 6 times.  Hold a position 10 seconds.  Ice directly afterward.  You may use heat just prior to your stretches.  You will return in 5 days.  Mid-Back Strain With Rehab  A strain is an injury in which a tendon or muscle is torn. The muscles and tendons of the mid-back are vulnerable to strains. However, these muscles and tendons are very strong and require a great force to be injured. The muscles of the mid-back are responsible for stabilizing the spinal column, as well as spinal twisting (rotation). Strains are classified into three categories. Grade 1 strains cause pain, but the tendon is not lengthened. Grade 2 strains include a lengthened ligament, due to the ligament being stretched or partially ruptured. With grade 2 strains there is still function, although the function may be decreased. Grade 3 strains involve a complete tear of the tendon or muscle, and function is usually impaired. SYMPTOMS   Pain in the middle of the back.  Pain that may affect  only one side, and is worse with movement.  Muscle spasms, and often swelling in the back.  Loss of strength of the back muscles.  Crackling sound (crepitation) when the muscles are touched. CAUSES  Mid-back strains occur when a force is placed on the muscles or tendons that is greater than they can handle. Common causes of injury include:  Ongoing overuse of the muscle-tendon units in the middle back, usually from incorrect body posture.  A single violent injury or force applied to the back. RISK INCREASES WITH:  Sports that involve twisting forces on the spine or a lot of bending at the waist (football, rugby, weightlifting, bowling, golf, tennis, speed skating, racquetball, swimming, running, gymnastics, diving).  Poor strength and flexibility.  Failure to warm up properly before activity.  Family history of low back pain or disk disorders.  Previous back injury or surgery (especially fusion). PREVENTION  Learn and use proper sports technique.  Warm up and stretch properly before activity.  Allow for adequate recovery between workouts.  Maintain physical fitness:  Strength, flexibility, and endurance.  Cardiovascular fitness. PROGNOSIS  If treated properly, mid-back strains usually heal within 6 weeks. RELATED COMPLICATIONS   Frequently recurring symptoms, resulting in a chronic problem. Properly treating the problem the first time decreases frequency of recurrence.  Chronic inflammation, scarring, and partial muscle-tendon tear.  Delayed healing or resolution of symptoms, especially if activity is resumed too soon.  Prolonged disability. TREATMENT Treatment first involves the use of ice and  medicine, to reduce pain and inflammation. As the pain begins to subside, you may begin strengthening and stretching exercises to improve body posture and sport technique. These exercises may be performed at home or with a therapist. Severe injuries may require referral to a  therapist for further evaluation and treatment, such as ultrasound. Corticosteroid injections may be given to help reduce inflammation. Biofeedback (watching monitors of your body processes) and psychotherapy may also be prescribed. Prolonged bed rest is felt to do more harm than good. Massage may help break the muscle spasms. Sometimes, an injection of cortisone, with or without local anesthetics, may be given to help relieve the pain and spasms. MEDICATION   If pain medicine is needed, nonsteroidal anti-inflammatory medicines (aspirin and ibuprofen), or other minor pain relievers (acetaminophen), are often advised.  Do not take pain medicine for 7 days before surgery.  Prescription pain relievers may be given, if your caregiver thinks they are needed. Use only as directed and only as much as you need.  Ointments applied to the skin may be helpful.  Corticosteroid injections may be given by your caregiver. These injections should be reserved for the most serious cases, because they may only be given a certain number of times. HEAT AND COLD:   Cold treatment (icing) should be applied for 10 to 15 minutes every 2 to 3 hours for inflammation and pain, and immediately after activity that aggravates your symptoms. Use ice packs or an ice massage.  Heat treatment may be used before performing stretching and strengthening activities prescribed by your caregiver, physical therapist, or athletic trainer. Use a heat pack or a warm water soak. SEEK IMMEDIATE MEDICAL CARE IF:  Symptoms get worse or do not improve in 2 to 4 weeks, despite treatment.  You develop numbness, weakness, or loss of bowel or bladder function.  New, unexplained symptoms develop. (Drugs used in treatment may produce side effects.) EXERCISES RANGE OF MOTION (ROM) AND STRETCHING EXERCISES - Mid-Back Strain These exercises may help you when beginning to rehabilitate your injury. In order to successfully resolve your symptoms, you  must improve your posture. These exercises are designed to help reduce the forward-head and rounded-shoulder posture which contributes to this condition. Your symptoms may resolve with or without further involvement from your physician, physical therapist or athletic trainer. While completing these exercises, remember:   Restoring tissue flexibility helps normal motion to return to the joints. This allows healthier, less painful movement and activity.  An effective stretch should be held for at least 30 seconds.  A stretch should never be painful. You should only feel a gentle lengthening or release in the stretched tissue. STRETCH - Axial Extension  Stand or sit on a firm surface. Assume a good posture: chest up, shoulders drawn back, stomach muscles slightly tense, knees unlocked (if standing) and feet hip width apart.  Slowly retract your chin, so your head slides back and your chin slightly lowers. Continue to look straight ahead.  You should feel a gentle stretch in the back of your head. Be certain not to feel an aggressive stretch since this can cause headaches later.  Hold for __________ seconds. Repeat __________ times. Complete this exercise __________ times per day. RANGE OF MOTION- Upper Thoracic Extension  Sit on a firm chair with a high back. Assume a good posture: chest up, shoulders drawn back, abdominal muscles slightly tense, and feet hip width apart. Place a small pillow or folded towel in the curve of your lower back, if you  are having difficulty maintaining good posture.  Gently brace your neck with your hands, allowing your arms to rest on your chest.  Continue to support your neck and slowly extend your back over the chair. You will feel a stretch across your upper back.  Hold __________ seconds. Slowly return to the starting position. Repeat __________ times. Complete this exercise __________ times per day. RANGE OF MOTION- Mid-Thoracic Extension  Roll a towel so  that it is about 4 inches in diameter.  Position the towel lengthwise. Lay on the towel so that your spine, but not your shoulder blades, are supported.  You should feel your mid-back arching toward the floor. To increase the stretch, extend your arms away from your body.  Hold for __________ seconds. Repeat exercise __________ times, __________ times per day. STRENGTHENING EXERCISES - Mid-Back Strain These exercises may help you when beginning to rehabilitate your injury. They may resolve your symptoms with or without further involvement from your physician, physical therapist or athletic trainer. While completing these exercises, remember:   Muscles can gain both the endurance and the strength needed for everyday activities through controlled exercises.  Complete these exercises as instructed by your physician, physical therapist or athletic trainer. Increase the resistance and repetitions only as guided by your caregiver.  You may experience muscle soreness or fatigue, but the pain or discomfort you are trying to eliminate should never worsen during these exercises. If this pain does worsen, stop and make certain you are following the directions exactly. If the pain is still present after adjustments, discontinue the exercise until you can discuss the trouble with your caregiver. STRENGTHENING - Quadruped, Opposite UE/LE Lift  Assume a hands and knees position on a firm surface. Keep your hands under your shoulders and your knees under your hips. You may place padding under your knees for comfort.  Find your neutral spine and gently tense your abdominal muscles so that you can maintain this position. Your shoulders and hips should form a rectangle that is parallel with the floor and is not twisted.  Keeping your trunk steady, lift your right hand no higher than your shoulder and then your left leg no higher than your hip. Make sure you are not holding your breath. Hold this position  __________ seconds.  Continuing to keep your abdominal muscles tense and your back steady, slowly return to your starting position. Repeat with the opposite arm and leg. Repeat __________ times. Complete this exercise __________ times per day.  STRENGTH - Shoulder Extensors  Secure a rubber exercise band or tubing to a fixed object (table, pole) so that it is at the height of your shoulders when you are either standing, or sitting on a firm armless chair.  With a thumbs-up grip, grasp an end of the band in each hand. Straighten your elbows and lift your hands straight in front of you at shoulder height. Step back away from the secured end of band, until it becomes tense.  Squeezing your shoulder blades together, pull your hands down to the sides of your thighs. Do not allow your hands to go behind you.  Hold for __________ seconds. Slowly ease the tension on the band, as you reverse the directions and return to the starting position. Repeat __________ times. Complete this exercise __________ times per day.  STRENGTH - Horizontal Abductors Choose one of the two positions to complete this exercise. Prone: lying on stomach:  Lie on your stomach on a firm surface so that your right /  left arm overhangs the edge. Rest your forehead on your opposite forearm. With your palm facing the floor and your elbow straight, hold a __________ weight in your hand.  Squeeze your right / left shoulder blade to your mid-back spine and then slowly raise your arm to the height of the bed.  Hold for __________ seconds. Slowly reverse the directions and return to the starting position, controlling the weight as you lower your arm. Repeat __________ times. Complete this exercise __________ times per day. Standing:   Secure a rubber exercise band or tubing, so that it is at the height of your shoulders when you are either standing, or sitting on a firm armless chair.  Grasp an end of the band in each hand and have  your palms face each other. Straighten your elbows and lift your hands straight in front of you at shoulder height. Step back away from the secured end of band, until it becomes tense.  Squeeze your shoulder blades together. Keeping your elbows locked and your hands at shoulder height, spread your arms apart, forming a "T" shape with your body. Hold __________ seconds. Slowly ease the tension on the band, as you reverse the directions and return to the starting position. Repeat __________ times. Complete this exercise __________ times per day. STRENGTH - Scapular Retractors and External Rotators, Rowing  Secure a rubber exercise band or tubing, so that it is at the height of your shoulders when you are either standing, or sitting on a firm armless chair.  With a palm-down grip, grasp an end of the band in each hand. Straighten your elbows and lift your hands straight in front of you at shoulder height. Step back away from the secured end of band, until it becomes tense.  Step 1: Squeeze your shoulder blades together. Bending your elbows, draw your hands to your chest as if you are rowing a boat. At the end of this motion, your hands and elbow should be at shoulder height and your elbows should be out to your sides.  Step 2: Rotate your shoulder to raise your hands above your head. Your forearms should be vertical and your upper arms should be horizontal.  Hold for __________ seconds. Slowly ease the tension on the band, as you reverse the directions and return to the starting position. Repeat __________ times. Complete this exercise __________ times per day.  POSTURE AND BODY MECHANICS CONSIDERATIONS - Mid-Back Strain Keeping correct posture when sitting, standing or completing your activities will reduce the stress put on different body tissues, allowing injured tissues a chance to heal and limiting painful experiences. The following are general guidelines for improved posture. Your physician or  physical therapist will provide you with any instructions specific to your needs. While reading these guidelines, remember:  The exercises prescribed by your provider will help you have the flexibility and strength to maintain correct postures.  The correct posture provides the best environment for your joints to work. All of your joints have less wear and tear when properly supported by a spine with good posture. This means you will experience a healthier, less painful body.  Correct posture must be practiced with all of your activities, especially prolonged sitting and standing. Correct posture is as important when doing repetitive low-stress activities (typing) as it is when doing a single heavy-load activity (lifting). PROPER SITTING POSTURE In order to minimize stress and discomfort on your spine, you must sit with correct posture. Sitting with good posture should be effortless for  a healthy body. Returning to good posture is a gradual process. Many people can work toward this most comfortably by using various supports until they have the flexibility and strength to maintain this posture on their own. When sitting with proper posture, your ears will fall over your shoulders and your shoulders will fall over your hips. You should use the back of the chair to support your upper back. Your lower back will be in a neutral position, just slightly arched. You may place a small pillow or folded towel at the base of your low back for  support.  When working at a desk, create an environment that supports good, upright posture. Without extra support, muscles fatigue and lead to excessive strain on joints and other tissues. Keep these recommendations in mind: CHAIR:  A chair should be able to slide under your desk when your back makes contact with the back of the chair. This allows you to work closely.  The chair's height should allow your eyes to be level with the upper part of your monitor and your hands  to be slightly lower than your elbows. BODY POSITION  Your feet should make contact with the floor. If this is not possible, use a foot rest.  Keep your ears over your shoulders. This will reduce stress on your neck and lower back. INCORRECT SITTING POSTURES If you are feeling tired and unable to assume a healthy sitting posture, do not slouch or slump. This puts excessive strain on your back tissues, causing more damage and pain. Healthier options include:  Using more support, like a lumbar pillow.  Switching tasks to something that requires you to be upright or walking.  Talking a brief walk.  Lying down to rest in a neutral-spine position. CORRECT STANDING POSTURES Proper standing posture should be assumed with all daily activities, even if they only take a few moments, like when brushing your teeth. As in sitting, your ears should fall over your shoulders and your shoulders should fall over your hips. You should keep a slight tension in your abdominal muscles to brace your spine. Your tailbone should point down to the ground, not behind your body, resulting in an over-extended swayback posture.  INCORRECT STANDING POSTURES Common incorrect standing postures include a forward head, locked knees, and an excessive swayback. WALKING Walk with an upright posture. Your ears, shoulders and hips should all line-up. CORRECT LIFTING TECHNIQUES DO :   Assume a wide stance. This will provide you more stability and the opportunity to get as close as possible to the object which you are lifting.  Tense your abdominals to brace your spine. Bend at the knees and hips. Keeping your back locked in a neutral-spine position, lift using your leg muscles. Lift with your legs, keeping your back straight.  Test the weight of unknown objects before attempting to lift them.  Try to keep your elbows locked down at your sides in order get the best strength from your shoulders when carrying an  object.  Always ask for help when lifting heavy or awkward objects. INCORRECT LIFTING TECHNIQUES DO NOT:   Lock your knees when lifting, even if it is a small object.  Bend and twist. Pivot at your feet or move your feet when needing to change directions.  Assume that you can safely pick up even a paperclip without proper posture.   This information is not intended to replace advice given to you by your health care provider. Make sure you discuss any questions  you have with your health care provider.   Document Released: 02/05/2005 Document Revised: 06/22/2014 Document Reviewed: 05/20/2008 Elsevier Interactive Patient Education [employer].

## 2015-06-16 NOTE — Progress Notes (Addendum)
Janet Douglas Oct 25, 1965 50 y.o.   Chief Complaint  Patient presents with  . Back Pain    Work injury    Date of Injury: 06/13/2015  History of Present Illness:  Presents for evaluation of work-related complaint.  Patient is here today for cc of mid back pain that started yesterday.  She was lifting the truck tailgate to open, and a box got caught on the door, jutting her back forward.  She instantly felt a stabbing pain along the left side.  Hurts to bend and lift.  There is no extremity tenderness.  No radiating pain.  No sob or dyspnea.    ROS ROS otherwise unremarkable unless listed above.    Allergies  Allergen Reactions  . Bupropion Hcl     REACTION: itching     Current medications reviewed and updated. Past medical history, family history, social history have been reviewed and updated.   Physical Exam  Constitutional: She is oriented to person, place, and time and well-developed, well-nourished, and in no distress. No distress.  HENT:  Head: Normocephalic.  Mouth/Throat: No oropharyngeal exudate.  Eyes: Pupils are equal, round, and reactive to light.  Neck: Normal range of motion.  Cardiovascular: Normal rate, regular rhythm and normal heart sounds.   Pulmonary/Chest: Effort normal. No respiratory distress. She has no wheezes.  Musculoskeletal:       Thoracic back: She exhibits tenderness (posterior left lateral tenderness.  ). She exhibits normal range of motion, no bony tenderness, no swelling and no spasm.  Neurological: She is alert and oriented to person, place, and time. Gait normal.  Skin: Skin is warm and dry. She is not diaphoretic.  Psychiatric: Mood and affect normal.     Assessment and Plan: 50 year old here today for mid back pain.  This appears muscular.  Advised ice and stretches.  Will treat both with anti-inflammatory and muscle relaxant. I have advised heat just prior to stretches. Work restrictions placed on form and minimal instruction on  letter.  Strain of mid-back, initial encounter - Plan: meloxicam (MOBIC) 15 MG tablet, cyclobenzaprine (FLEXERIL) 5 MG tablet  Ivar Drape, PA-C [facility] 4/27/20178:35 AM

## 2015-06-20 ENCOUNTER — Ambulatory Visit (INDEPENDENT_AMBULATORY_CARE_PROVIDER_SITE_OTHER): Admitting: Physician Assistant

## 2015-06-20 DIAGNOSIS — S29012D Strain of muscle and tendon of back wall of thorax, subsequent encounter: Secondary | ICD-10-CM | POA: Diagnosis not present

## 2015-06-20 NOTE — Patient Instructions (Addendum)
     IF you received an x-ray today, you will receive an invoice from [facility]. Please contact [facility] at [phone] with questions or concerns regarding your invoice.   IF you received labwork today, you will receive an invoice from [employer]. Please contact [facility] at [phone] with questions or concerns regarding your invoice.   Our billing staff will not be able to assist you with questions regarding bills from these companies.  You will be contacted with the lab results as soon as they are available. The fastest way to get your results is to activate your My Chart account. Instructions are located on the last page of this paperwork. If you have not heard from [place] regarding the results in 2 weeks, please contact this office.    Continue stretches as you have been doing, and icing.  And also continue the medication.  We will meet back again 06/27/2015.

## 2015-06-20 NOTE — Progress Notes (Signed)
Janet Douglas February 15, 1966 50 y.o.   Chief Complaint  Patient presents with  . Follow-up    W/C follow for lower back pain      Date of Injury: 06/13/2015  History of Present Illness:  Presents for evaluation of work-related complaint.  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 This was diagnosed as a midback strain and she has been placed on anti-inflammatories, muscle relaxant and stretches for the last week. Today she reports that her symptoms have improved. She is getting better range of motion though the pain is still present. She is performing the stretches as discussed. She is also using the anti-inflammatory and the Flexeril. She has been placed on light duties at work. 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   ROS ROS otherwise unremarkable unless listed above.    Allergies  Allergen Reactions  . Bupropion Hcl     REACTION: itching     Current medications reviewed and updated. Past medical history, family history, social history have been reviewed and updated.   Physical Exam  Constitutional: She is oriented to person, place, and time and well-developed, well-nourished, and in no distress. No distress.  HENT:  Head: Normocephalic and atraumatic.  Cardiovascular: Normal rate.   Pulmonary/Chest: Effort normal. No respiratory distress.  Musculoskeletal:  Left posterior thoracic tenderness upon palpation. Pain with lateral deviation though normal range of motion. He also exhibits normal range of motion with flexion and with torso rotation though pain isn't decided with.  No spine or bone tenderness. No erythema,  Neurological: She is alert and oriented to person, place, and time.  Skin: Skin is warm and dry. She is not diaphoretic.  Psychiatric: Mood and affect normal.     Assessment and Plan: 50 year old  female is here today with a work-related injury follow-up. This appears to be healing quite well. I have advised her to continue her medications and she will also do the stretches. She will repeat ices as instructed as well. I will continue with the restrictions as we do not want her back strain to resurface or worsen. She will return in one week.  Strain of mid-back, subsequent encounter  Ivar Drape, PA-C [facility] 5/1/201712:47 PM

## 2015-06-27 ENCOUNTER — Ambulatory Visit (INDEPENDENT_AMBULATORY_CARE_PROVIDER_SITE_OTHER): Admitting: Physician Assistant

## 2015-06-27 VITALS — BP 112/74 | HR 82 | Temp 97.9°F | Resp 18 | Wt 197.0 lb

## 2015-06-27 DIAGNOSIS — S29012D Strain of muscle and tendon of back wall of thorax, subsequent encounter: Secondary | ICD-10-CM

## 2015-06-27 NOTE — Patient Instructions (Signed)
     IF you received an x-ray today, you will receive an invoice from [facility]. Please contact  Radiology at [phone] with questions or concerns regarding your invoice.   IF you received labwork today, you will receive an invoice from [facility]. Please contact [facility] at [phone] with questions or concerns regarding your invoice.   Our billing staff will not be able to assist you with questions regarding bills from these companies.  You will be contacted with the lab results as soon as they are available. The fastest way to get your results is to activate your My Chart account. Instructions are located on the last page of this paperwork. If you have not heard from us regarding the results in 2 weeks, please contact this office.      

## 2015-06-27 NOTE — Progress Notes (Signed)
Janet Douglas 11-01-1965 50 y.o.   Chief Complaint  Patient presents with  . Follow-up    WC     Date of Injury: 06/13/2015   History of Present Illness:  Presents for evaluation of work-related complaint.  Patient returned for follow-up of right mid back strain. She currently is on work restrictions of a light duty regimen. She reports that she has had improvement of her pain. She is doing the stretches as advised to 3 times per day. She is also icing regularly. She notes that there is great improvement in the twisting of her home body. But she continues to have pain with bending. This seems to be prevalent in her line of work. 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 She had attempted this last week and had lingering pains for a couple days.  ROS ROS otherwise unremarkable unless listed above.    Allergies  Allergen Reactions  . Bupropion Hcl     REACTION: itching     Current medications reviewed and updated. Past medical history, family history, social history have been reviewed and updated.   Physical Exam  Constitutional: She is oriented to person, place, and time and well-developed, well-nourished, and in no distress. No distress.  HENT:  Head: Normocephalic and atraumatic.  Mouth/Throat: No oropharyngeal exudate.  Eyes: Conjunctivae are normal. Pupils are equal, round, and reactive to light. Right eye exhibits no discharge. Left eye exhibits no discharge.  Cardiovascular: Normal rate.   Pulmonary/Chest: Effort normal and breath sounds normal. No respiratory distress.  Musculoskeletal:       Thoracic back: She exhibits normal range of motion, no tenderness, no bony tenderness, no swelling and no spasm.  Some tenderness with forward flexion, though no decrease in rom.  Hyperextension normal.   Normal lower extremity.  Neurological: She is alert and oriented to person, place, and time.  Skin: Skin is warm and dry. She is not diaphoretic.  Psychiatric: Mood and  affect normal.     Assessment and Plan: 50 year old female is here today for follow up of mid-back strain.  This is healing appropriately.   I have filled out the form for work restrictions.  We will release her from all restrictions except bending, and lifting over 10lbs.  She will follow up in 1 week, 07/05/2015

## 2015-07-05 ENCOUNTER — Ambulatory Visit (INDEPENDENT_AMBULATORY_CARE_PROVIDER_SITE_OTHER): Admitting: Physician Assistant

## 2015-07-05 VITALS — BP 120/80 | HR 86 | Temp 98.0°F | Resp 17 | Ht 66.25 in | Wt 195.8 lb

## 2015-07-05 DIAGNOSIS — S29012D Strain of muscle and tendon of back wall of thorax, subsequent encounter: Secondary | ICD-10-CM | POA: Diagnosis not present

## 2015-07-05 NOTE — Progress Notes (Signed)
[facility] [address] [place] [address] [phone]  Date:  07/05/2015   Name:  Janet Douglas   DOB:  1965-12-16   MRN:  [id number]  PCP:  Loura Pardon, MD    History of Present Illness:  Janet Douglas is a 50 y.o. female patient who presents to [facility] for follow up of right midback strain.  This continues to improve.  She is in her 4th week post-injury.  She still has difficulty with bending waist down.  She is practicing appropriate lifting to preventing injury.  She is on a restriction of carrying under 10lbs, and bending.  She states that she continues to do stretches.  Strengthening training is limited.  She has no radiating pain, numbness or tingling.    Past Surgical History  Procedure Laterality Date  . Abdominal US  05/19/1999    Negative  . Tubal ligation    . 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 Abdominal US  08/10    Normal    Social History  Substance Use Topics  . Smoking status: Never Smoker   . Smokeless tobacco: Never Used  . Alcohol Use: 0.0 oz/week    0 Standard drinks or equivalent per week     Comment: Minimal   Allergies: Buproprion  Medication list has been reviewed and updated.  Current Outpatient Prescriptions on File Prior to Visit  Medication Sig Dispense Refill  . levothyroxine (SYNTHROID, LEVOTHROID) 88 MCG tablet TAKE 1 TABLET BY MOUTH DAILY 30 tablet 9  . meloxicam (MOBIC) 15 MG tablet Take 1 tablet (15 mg total) by mouth daily. 30 tablet 0  . MULTIPLE VITAMINS PO Take by mouth daily. Reported on 06/14/2015    . Naproxen Sod-Diphenhydramine (ALEVE PM PO) Take 1 tablet by mouth daily as needed. Reported on 06/14/2015    . sertraline (ZOLOFT) 100 MG tablet TAKE 1 TABLET BY MOUTH EVERY DAY 90 tablet 1   No current facility-administered medications on file prior to visit.    ROS ROS otherwise unremarkable unless listed above.   Physical Examination: BP 120/80 mmHg  Pulse 86  Temp(Src) 98 F (36.7 C)  (Oral)  Resp 17  Ht 5' 6.25" (1.683 m)  Wt 195 lb 12.8 oz (88.814 kg)  BMI 31.36 kg/m2  SpO2 97%  LMP 06/11/2015 Ideal Body Weight: Weight in (lb) to have BMI = 25: 155.7  Physical Exam  Constitutional: She is oriented to person, place, and time. She appears well-developed and well-nourished. No distress.  HENT:  Head: Normocephalic and atraumatic.  Right Ear: External ear normal.  Left Ear: External ear normal.  Eyes: Conjunctivae and EOM are normal. Pupils are equal, round, and reactive to light.  Cardiovascular: Normal rate.   Pulmonary/Chest: Effort normal. No respiratory distress.  Musculoskeletal:  No spinous tenderness throughout.  No erythema, swelling observed.  She has pain with left lateral deviation, though no decrease of rom.  Forward flexion normal.  Twisting incites minimal pain with right sided movement.   Neurological: She is alert and oriented to person, place, and time.  Skin: She is not diaphoretic.  Psychiatric: She has a normal mood and affect. Her behavior is normal.     Assessment and Plan: Janet Douglas is a 50 y.o. female who is here today for follow up of mid back strain.   This continues to improve.  She is apprehensive of full work release, as job duties are strenuous.  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  I am continuing the work restriction.  I have advised to add additional strength training core exercises that will strengthen midline.  She voiced understanding.  Will rtc in 10 days.    Strain of mid-back, subsequent encounter  Ivar Drape, PA-C [facility] 07/05/2015 10:15 AM

## 2015-07-05 NOTE — Patient Instructions (Signed)
     IF you received an x-ray today, you will receive an invoice from [facility]. Please contact  Radiology at [phone] with questions or concerns regarding your invoice.   IF you received labwork today, you will receive an invoice from [facility]. Please contact [facility] at [phone] with questions or concerns regarding your invoice.   Our billing staff will not be able to assist you with questions regarding bills from these companies.  You will be contacted with the lab results as soon as they are available. The fastest way to get your results is to activate your My Chart account. Instructions are located on the last page of this paperwork. If you have not heard from us regarding the results in 2 weeks, please contact this office.      

## 2015-07-14 ENCOUNTER — Ambulatory Visit (INDEPENDENT_AMBULATORY_CARE_PROVIDER_SITE_OTHER): Admitting: Family Medicine

## 2015-07-14 VITALS — BP 122/70 | HR 91 | Temp 97.9°F | Resp 16 | Ht 66.0 in | Wt 196.0 lb

## 2015-07-14 DIAGNOSIS — S29019D Strain of muscle and tendon of unspecified wall of thorax, subsequent encounter: Secondary | ICD-10-CM

## 2015-07-14 DIAGNOSIS — S29009D Unspecified injury of muscle and tendon of unspecified wall of thorax, subsequent encounter: Secondary | ICD-10-CM | POA: Diagnosis not present

## 2015-07-14 DIAGNOSIS — Y990 Civilian activity done for income or pay: Secondary | ICD-10-CM

## 2015-07-14 NOTE — Progress Notes (Signed)
   Subjective:    Patient ID: Janet Douglas, female    DOB: Apr 22, 1965, 50 y.o.   MRN: [id number]  07/14/2015  Follow-up   HPI This 50 y.o. female presents for evaluation nine day follow-up of lumbar strain thoracic region; workman's compensation injury.  Last four visits with Ivar Drape, PA-C.  Doing much better.  Denies radiation into arms.  No numbness/tingling/weakness/burning in extremities.   Review of Systems  Constitutional: Negative for fever, chills, diaphoresis and fatigue.  Eyes: Negative for visual disturbance.  Respiratory: Negative for cough and shortness of breath.   Cardiovascular: Negative for chest pain, palpitations and leg swelling.  Gastrointestinal: Negative for nausea, vomiting, abdominal pain, diarrhea and constipation.  Endocrine: Negative for cold intolerance, heat intolerance, polydipsia, polyphagia and polyuria.  Musculoskeletal: Positive for myalgias and back pain.  Neurological: Negative for dizziness, tremors, seizures, syncope, facial asymmetry, speech difficulty, weakness, light-headedness, numbness and headaches.       Objective:    BP 122/70 mmHg  Pulse 91  Temp(Src) 97.9 F (36.6 C) (Oral)  Resp 16  Ht 5\' 6"  (1.676 m)  Wt 196 lb (88.905 kg)  BMI 31.65 kg/m2  SpO2 97%  LMP 06/11/2015 Physical Exam  Constitutional: She is oriented to person, place, and time. She appears well-developed and well-nourished. No distress.  HENT:  Head: Normocephalic and atraumatic.  Right Ear: External ear normal.  Left Ear: External ear normal.  Nose: Nose normal.  Mouth/Throat: Oropharynx is clear and moist.  Eyes: Conjunctivae and EOM are normal. Pupils are equal, round, and reactive to light.  Neck: Normal range of motion. Neck supple. Carotid bruit is not present. No thyromegaly present.  Cardiovascular: Normal rate, regular rhythm, normal heart sounds and intact distal pulses.  Exam reveals no gallop and no friction rub.   No murmur  heard. Pulmonary/Chest: Effort normal and breath sounds normal. She has no wheezes. She has no rales.  Abdominal: Soft. Bowel sounds are normal. She exhibits no distension and no mass. There is no tenderness. There is no rebound and no guarding.  Musculoskeletal:       Cervical back: Normal. She exhibits normal range of motion, no tenderness, no bony tenderness, no pain, no spasm and normal pulse.       Thoracic back: Normal. She exhibits normal range of motion, no tenderness, no bony tenderness, no pain, no spasm and normal pulse.       Lumbar back: Normal. She exhibits normal range of motion, no tenderness, no bony tenderness, no swelling, no pain, no spasm and normal pulse.  Lymphadenopathy:    She has no cervical adenopathy.  Neurological: She is alert and oriented to person, place, and time. No cranial nerve deficit.  Skin: Skin is warm and dry. No rash noted. She is not diaphoretic. No erythema. No pallor.  Psychiatric: She has a normal mood and affect. Her behavior is normal.        Assessment & Plan:   1. Thoracic myofascial strain, subsequent encounter   2. Work related injury    -improved. -return to regular duty. -follow-up PRN only.   No orders of the defined types were placed in this encounter.   No orders of the defined types were placed in this encounter.    No Follow-up on file.    Ettie Krontz Elayne Guerin, M.D. [facility] [address] [place], [place]  [address] [phone] phone [phone] fax

## 2015-07-14 NOTE — Patient Instructions (Signed)
     IF you received an x-ray today, you will receive an invoice from [facility]. Please contact [facility] at [phone] with questions or concerns regarding your invoice.   IF you received labwork today, you will receive an invoice from [facility]. Please contact [facility] at [phone] with questions or concerns regarding your invoice.   Our billing staff will not be able to assist you with questions regarding bills from these companies.  You will be contacted with the lab results as soon as they are available. The fastest way to get your results is to activate your My Chart account. Instructions are located on the last page of this paperwork. If you have not heard from us regarding the results in 2 weeks, please contact this office.      

## 2015-08-12 ENCOUNTER — Other Ambulatory Visit: Payer: Self-pay | Admitting: Family Medicine

## 2015-08-12 NOTE — Telephone Encounter (Signed)
Please refill times 3 

## 2015-08-12 NOTE — Telephone Encounter (Signed)
Not on med list please advise 

## 2015-08-12 NOTE — Telephone Encounter (Signed)
done

## 2015-08-17 ENCOUNTER — Other Ambulatory Visit: Payer: Self-pay | Admitting: Family Medicine

## 2015-08-17 NOTE — Telephone Encounter (Signed)
Pt hasn't had a TSH lab almost a year. She does have an appt with Dr. Glori Bickers on 08/31/15 to have moles checked, please advise

## 2015-08-17 NOTE — Telephone Encounter (Signed)
Please refill times one and we can check it at her July appt Thanks

## 2015-08-17 NOTE — Telephone Encounter (Signed)
done

## 2015-08-31 ENCOUNTER — Encounter: Payer: Self-pay | Admitting: Family Medicine

## 2015-08-31 ENCOUNTER — Ambulatory Visit (INDEPENDENT_AMBULATORY_CARE_PROVIDER_SITE_OTHER): Payer: Federal, State, Local not specified - PPO | Admitting: Family Medicine

## 2015-08-31 VITALS — BP 110/62 | HR 65 | Temp 97.9°F | Ht 66.0 in | Wt 196.0 lb

## 2015-08-31 DIAGNOSIS — M25511 Pain in right shoulder: Secondary | ICD-10-CM | POA: Diagnosis not present

## 2015-08-31 DIAGNOSIS — M25551 Pain in right hip: Secondary | ICD-10-CM

## 2015-08-31 DIAGNOSIS — E02 Subclinical iodine-deficiency hypothyroidism: Secondary | ICD-10-CM | POA: Diagnosis not present

## 2015-08-31 DIAGNOSIS — H539 Unspecified visual disturbance: Secondary | ICD-10-CM | POA: Diagnosis not present

## 2015-08-31 DIAGNOSIS — N951 Menopausal and female climacteric states: Secondary | ICD-10-CM

## 2015-08-31 DIAGNOSIS — R5382 Chronic fatigue, unspecified: Secondary | ICD-10-CM | POA: Diagnosis not present

## 2015-08-31 DIAGNOSIS — D229 Melanocytic nevi, unspecified: Secondary | ICD-10-CM

## 2015-08-31 LAB — TSH: TSH: 3.15 u[IU]/mL (ref 0.35–4.50)

## 2015-08-31 LAB — COMPREHENSIVE METABOLIC PANEL
ALBUMIN: 4.4 g/dL (ref 3.5–5.2)
ALT: 16 U/L (ref 0–35)
AST: 18 U/L (ref 0–37)
Alkaline Phosphatase: 62 U/L (ref 39–117)
BUN: 21 mg/dL (ref 6–23)
CALCIUM: 9.4 mg/dL (ref 8.4–10.5)
CHLORIDE: 111 meq/L (ref 96–112)
CO2: 25 mEq/L (ref 19–32)
CREATININE: 0.8 mg/dL (ref 0.40–1.20)
GFR: 80.55 mL/min (ref >60.00)
Glucose, Bld: 118 mg/dL — ABNORMAL HIGH (ref 70–99)
Potassium: 3.7 mEq/L (ref 3.5–5.1)
SODIUM: 130 meq/L — AB (ref 135–145)
Total Bilirubin: 0.6 mg/dL (ref 0.2–1.2)
Total Protein: 7.5 g/dL (ref 6.0–8.3)

## 2015-08-31 LAB — CBC WITH DIFFERENTIAL/PLATELET
BASOS ABS: 0.1 10*3/uL (ref 0.0–0.1)
Basophils Relative: 0.6 % (ref 0.0–3.0)
Eosinophils Absolute: 0.1 10*3/uL (ref 0.0–0.7)
Eosinophils Relative: 1.2 % (ref 0.0–5.0)
HEMATOCRIT: 36.3 % (ref 36.0–46.0)
HEMOGLOBIN: 11.5 g/dL — AB (ref 12.0–15.0)
LYMPHS PCT: 17.9 % (ref 12.0–46.0)
Lymphs Abs: 1.8 10*3/uL (ref 0.7–4.0)
MCHC: 31.8 g/dL (ref 30.0–36.0)
MCV: 65.7 fl — ABNORMAL LOW (ref 78.0–100.0)
MONO ABS: 0.5 10*3/uL (ref 0.1–1.0)
Monocytes Relative: 5.3 % (ref 3.0–12.0)
Neutro Abs: 7.6 10*3/uL (ref 1.4–7.7)
Neutrophils Relative %: 75 % (ref 43.0–77.0)
Platelets: 212 10*3/uL (ref 150.0–400.0)
RBC: 5.53 Mil/uL — AB (ref 3.87–5.11)
RDW: 15.3 % (ref 11.5–15.5)
WBC: 10.1 10*3/uL (ref 4.0–10.5)

## 2015-08-31 LAB — VITAMIN B12: Vitamin B-12: 426 pg/mL (ref 211–911)

## 2015-08-31 NOTE — Assessment & Plan Note (Signed)
Age 50 with irregular and shorter menses-expect menopause soon Antic guidance Having some fatigue  Continue to follow

## 2015-08-31 NOTE — Progress Notes (Signed)
Subjective:    Patient ID: Janet Douglas, female    DOB: 01/01/1966, 50 y.o.   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 Vision changes 08/31/2015  . Right shoulder pain 08/31/2015  . Perimenopausal 08/31/2015  . Nevus 08/31/2015  . Right hip pain 08/31/2015  . Left shoulder pain 12/08/2014  . Hyperglycemia 08/31/2014  . Fatigue 08/31/2014  . Perimenopause 08/31/2014  . Screening for lipoid disorders 08/31/2014  . Low back pain 07/12/2014  . Pain in right wrist 08/26/2013  . Cough 01/14/2013  . Left-sided headache 11/28/2012  . History of brain tumor 11/28/2012  . Lump of scalp 06/16/2012  . Skin lesion of face 04/14/2012  . Obesity 06/05/2011  . Allergic rhinitis 05/18/2010  . DEPRESSION, SITUATIONAL 06/15/2009  . ELEVATED BLOOD PRESSURE WITHOUT DIAGNOSIS OF HYPERTENSION 06/15/2009  . PLANTAR FASCIITIS 05/20/2008  . METATARSALGIA 05/06/2008  . OTHER ENTHESOPATHY OF ANKLE AND TARSUS 05/06/2008  . SESAMOIDITIS 11/04/2007  . NONORGANIC SLEEP DISORDER UNSPECIFIED 04/24/2007  . POOR CONCENTRATION 09/10/2006  . Hypothyroidism 09/09/2006  . Anemia 09/09/2006  . TRANSAMINASES, SERUM, ELEVATED  09/09/2006  . ABNORMAL PAP SMEAR 09/09/2006   Past Medical History  Diagnosis Date  . Anemia     NOS  . Hypothyroidism   . Brain tumor ([place])     S/P resection with shunt   Past Surgical History  Procedure Laterality Date  . Abdominal US  05/19/1999    Negative  . Tubal ligation    . 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 Abdominal US  08/10    Normal   Social History  Substance Use Topics  . Smoking status: Never Smoker   . Smokeless tobacco: Never Used  . Alcohol Use: 0.0 oz/week    0 Standard drinks or equivalent per week     Comment: Minimal   Family History  Problem Relation Age of Onset  . Thyroid disease Mother   . Thyroid disease Sister   . Heart disease Sister     Low heart rate  . Thyroid disease Brother   . Thyroid disease Brother    Allergies  Allergen Reactions  . Bupropion Hcl     REACTION: itching   Current Outpatient Prescriptions on File Prior to Visit  Medication Sig Dispense Refill  . amoxicillin (AMOXIL) 500 MG capsule TAKE 4 CAPSULES BY MOUTH 1 HOUR BEFORE DENTAL APPOINTMENT 4 capsule 3  . levothyroxine (SYNTHROID, LEVOTHROID) 88 MCG tablet TAKE 1 TABLET BY MOUTH DAILY 30 tablet 0  .  meloxicam (MOBIC) 15 MG tablet Take 1 tablet (15 mg total) by mouth daily. 30 tablet 0  . MULTIPLE VITAMINS PO Take by mouth daily. Reported on 06/14/2015    . Naproxen Sod-Diphenhydramine (ALEVE PM PO) Take 1 tablet by mouth daily as needed. Reported on 07/14/2015    . sertraline (ZOLOFT) 100 MG tablet TAKE 1 TABLET BY MOUTH EVERY DAY 90 tablet 1   No current facility-administered medications on file prior to visit.    Review of Systems    Review of Systems  Constitutional: Negative for fever, appetite change, and unexpected weight change. pos for fatigue Eyes: Negative for pain or itching  Respiratory: Negative for cough and shortness of breath.   Cardiovascular: Negative for cp or palpitations    Gastrointestinal: Negative for nausea, diarrhea  and constipation.  Genitourinary: Negative for urgency and frequency.  Skin: Negative for pallor or rash   MSK pos for R shoulder and hip pain  Neurological: Negative for weakness, light-headedness, numbness and headaches.  Hematological: Negative for adenopathy. Does not bruise/bleed easily.  Psychiatric/Behavioral: Negative for dysphoric mood. The patient is not nervous/anxious.      Objective:   Physical Exam  Constitutional: She appears well-developed and well-nourished. No distress.  obese and well appearing   HENT:  Head: Normocephalic and atraumatic.  Mouth/Throat: Oropharynx is clear and moist.  Eyes: Conjunctivae and EOM are normal. Pupils are equal, round, and reactive to light. Right eye exhibits no discharge. Left eye exhibits no discharge. No scleral icterus.  Drooping upper lids noted bilat   Neck: Normal range of motion. Neck supple. No JVD present. Carotid bruit is not present.  Cardiovascular: Normal rate, regular rhythm and normal heart sounds.   Pulmonary/Chest: Effort normal and breath sounds normal. No respiratory distress. She has no wheezes.  Abdominal: Soft. Bowel sounds are normal.  Musculoskeletal: She exhibits tenderness. She exhibits no edema.       Right shoulder: She exhibits tenderness. She exhibits normal range of motion, no effusion, no crepitus, no deformity, normal pulse and normal strength.       Right hip: She exhibits tenderness and bony tenderness. She exhibits normal range of motion, normal strength, no crepitus and no deformity.  R shoulder-nl rom - some pain with abd over 90 deg Acromion tenderness Mild pos Hawking test (neg Neer)  R hip- tender over greater trochanter Some discomfort with int rotation Nl gait  No neuro abn Well perf   Lymphadenopathy:    She has no cervical adenopathy.  Neurological: She is alert. She has normal strength and normal reflexes. She displays no atrophy and no tremor. No cranial nerve deficit or sensory  deficit. She exhibits normal muscle tone. Coordination and gait normal.  Skin: Skin is warm and dry. No rash noted. No erythema.  Lentigines diffusely   Multiple 3 or less MM smooth brown nevi on arms - symmetric and homogenous in color   2 very small (less than 8mm) skin tags on L neck  One 3 mm SK on L leg that is partially scraped or crusted off No s/s of infection   Small dematofibroma on R lower leg  Psychiatric: She has a normal mood and affect.        Assessment & Plan:   Problem List Items Addressed This Visit      Endocrine   Hypothyroidism    Feeling more tired Perimenopausal  Check TSH today      Relevant Orders   TSH  Musculoskeletal and Integument   Nevus    Pt has several flat to smooth brown symmetric nevi on upper and lower arms  Also a partially crusted off SK on L leg  Also 2 small skin tags (less than 2 mm) on L neck Reassured-non malignant appearing  Enc to continue sun protection          Other   Vision changes - Primary    Ref to opthalmology       Relevant Orders   Ambulatory referral to Ophthalmology   Right shoulder pain    Exam is indicative of R shoulder tendinitis/bursitis  Nsaid/cold compress/consv management Try to avoid lying on that side and also reped movement  Disc some passive rom exercises  Will update if stiff or limited rom  No indication of rot cuff tear-rom is still fairly good  F/u sport med if no imp 2 wk      Right hip pain    Exam is indicative of hip tendonitis/bursitis Nsaid/cold compress/consv management Try to avoid lying on that side and also reped movement  F/u sport med if no imp 2 wk      Perimenopausal    Age 19 with irregular and shorter menses-expect menopause soon Antic guidance Having some fatigue  Continue to follow      Fatigue    Suspect this is primarily from perimenopause Pt has had anemia in the past and also hypothyroid Lab today to r/o other causes Disc sleep hygiene, caff  avoidance,nutrition and exercise       Relevant Orders   CBC with Differential/Platelet   Comprehensive metabolic panel   TSH   Vitamin B12

## 2015-08-31 NOTE — Assessment & Plan Note (Signed)
Exam is indicative of hip tendonitis/bursitis Nsaid/cold compress/consv management Try to avoid lying on that side and also reped movement  F/u sport med if no imp 2 wk

## 2015-08-31 NOTE — Assessment & Plan Note (Signed)
Suspect this is primarily from perimenopause Pt has had anemia in the past and also hypothyroid Lab today to r/o other causes Disc sleep hygiene, caff avoidance,nutrition and exercise

## 2015-08-31 NOTE — Progress Notes (Signed)
Pre visit review using our clinic review tool, if applicable. No additional management support is needed unless otherwise documented below in the visit note. 

## 2015-08-31 NOTE — Assessment & Plan Note (Signed)
Exam is indicative of R shoulder tendinitis/bursitis  Nsaid/cold compress/consv management Try to avoid lying on that side and also reped movement  Disc some passive rom exercises  Will update if stiff or limited rom  No indication of rot cuff tear-rom is still fairly good  F/u sport med if no imp 2 wk

## 2015-08-31 NOTE — Assessment & Plan Note (Signed)
Feeling more tired Perimenopausal  Check TSH today

## 2015-08-31 NOTE — Patient Instructions (Signed)
I think you have tendonitis and bursitis of hip and shoulder from repetitive movement  Use a cold compress on both areas whenever possible Do not lie on that side 2 aleve with a meal twice daily is ok for pain and inflammation (12 hours apart) A benadryl at night is ok for sleep if needed  If no improvement in 2 weeks, make an appointment with Dr Lorelei Pont  Moles look ok for now-keep watching for change or growth Keep using sun protection  Fatigue may coming from perimenopause - normal  You are also due for labs Eat healthy and stay active

## 2015-08-31 NOTE — Assessment & Plan Note (Signed)
Ref to opthalmology

## 2015-08-31 NOTE — Assessment & Plan Note (Signed)
Pt has several flat to smooth brown symmetric nevi on upper and lower arms  Also a partially crusted off SK on L leg  Also 2 small skin tags (less than 2 mm) on L neck Reassured-non malignant appearing  Enc to continue sun protection

## 2015-09-09 ENCOUNTER — Other Ambulatory Visit: Payer: Self-pay | Admitting: Family Medicine

## 2015-09-26 ENCOUNTER — Other Ambulatory Visit (INDEPENDENT_AMBULATORY_CARE_PROVIDER_SITE_OTHER): Payer: Federal, State, Local not specified - PPO

## 2015-09-26 DIAGNOSIS — E876 Hypokalemia: Secondary | ICD-10-CM | POA: Diagnosis not present

## 2015-09-26 LAB — BASIC METABOLIC PANEL
BUN: 15 mg/dL (ref 6–23)
CALCIUM: 9.3 mg/dL (ref 8.4–10.5)
CO2: 26 mEq/L (ref 19–32)
Chloride: 105 mEq/L (ref 96–112)
Creatinine, Ser: 0.77 mg/dL (ref 0.40–1.20)
GFR: 84.16 mL/min (ref >60.00)
Glucose, Bld: 96 mg/dL (ref 70–99)
Potassium: 4.2 mEq/L (ref 3.5–5.1)
SODIUM: 136 meq/L (ref 135–145)

## 2015-09-27 ENCOUNTER — Encounter: Payer: Self-pay | Admitting: *Deleted

## 2015-11-27 ENCOUNTER — Other Ambulatory Visit: Payer: Self-pay | Admitting: Family Medicine

## 2015-11-28 NOTE — Telephone Encounter (Signed)
Last filled on 02/16/15 #90 +1, last OV 08/31/15. OK to refill?

## 2015-11-28 NOTE — Telephone Encounter (Signed)
done

## 2015-11-28 NOTE — Telephone Encounter (Signed)
Please refill for a year  

## 2016-01-16 ENCOUNTER — Other Ambulatory Visit: Payer: Self-pay | Admitting: Family Medicine

## 2016-01-16 NOTE — Telephone Encounter (Signed)
Last filled on 08/12/15 #4 caps with 3 additional refills, please advise

## 2016-01-16 NOTE — Telephone Encounter (Signed)
Please refill times 3 

## 2016-01-16 NOTE — Telephone Encounter (Signed)
done

## 2016-02-09 ENCOUNTER — Telehealth: Payer: Self-pay | Admitting: Family Medicine

## 2016-02-09 NOTE — Telephone Encounter (Signed)
[facility] Patient Name: Shaconda Korell DOB: [birth year] Initial Comment Caller needs refill (benzonatate 200 micro grams) Nurse Assessment Nurse: Adella Nissen, RN, Leitha Schuller Date/Time (Eastern Time): 02/09/2016 4:49:58 PM Confirm and document reason for call. If symptomatic, describe symptoms. ---Caller states using benzonatate 200 micro grams, usually only takes on the cold mornings when the cold weather makes her cough with her asthma, has not had refill for a year so needs renewed, she presently has five pills, but does not want to run out over the holiday. Pharmacy is [facility] on [address], [phone] Does the patient have any new or worsening symptoms? ---No Please document clinical information provided and list any resource used. ---Caller instructedto call office in the morning if prescription not sent to her pharmacy before noon. Guidelines Guideline Title Affirmed Question Affirmed Notes Final Disposition User Clinical Call [place], RN, Leitha Schuller Call Id: [phone]

## 2016-02-10 MED ORDER — BENZONATATE 200 MG PO CAPS: 200.0000 mg | ORAL_CAPSULE | Freq: Three times a day (TID) | ORAL | 5 refills | Status: DC | PRN

## 2016-02-10 NOTE — Telephone Encounter (Signed)
Will refill electronically  

## 2016-02-10 NOTE — Telephone Encounter (Signed)
Benzonatate last refilled # 90 x 5 on 08/26/2013. Pt last seen 08/31/15.

## 2016-02-17 ENCOUNTER — Ambulatory Visit (INDEPENDENT_AMBULATORY_CARE_PROVIDER_SITE_OTHER): Payer: Federal, State, Local not specified - PPO | Admitting: Internal Medicine

## 2016-02-17 ENCOUNTER — Encounter: Payer: Self-pay | Admitting: Internal Medicine

## 2016-02-17 VITALS — BP 110/70 | HR 74 | Temp 98.0°F | Wt 193.0 lb

## 2016-02-17 DIAGNOSIS — H579 Unspecified disorder of eye and adnexa: Secondary | ICD-10-CM

## 2016-02-17 MED ORDER — PREDNISONE 20 MG PO TABS: 40.0000 mg | ORAL_TABLET | Freq: Every day | ORAL | 0 refills | Status: DC

## 2016-02-17 NOTE — Progress Notes (Signed)
Pre visit review using our clinic review tool, if applicable. No additional management support is needed unless otherwise documented below in the visit note. 

## 2016-02-17 NOTE — Patient Instructions (Signed)
Please take the 3 days of prednisone and your zyrtec (10mg  twice a day if not too sedating) Try cool compresses. You should get rechecked if this worsens.

## 2016-02-17 NOTE — Progress Notes (Signed)
   Subjective:    Patient ID: Janet Douglas, female    DOB: [birth year], 50 y.o.   MRN: [id number]  HPI  Here due to eye swelling  Awoke yesterday--thought she had sty (on top lid) Very slight swelling and slight pain Put OTC cream on it--she has used before  Also tried hot wash cloth  Awoke this AM---top left lid very swollen Trouble opening it No gunk or crusting Watering in the cold air Vision is okay  Did try a new eye cream a few days ago  Current Outpatient Prescriptions on File Prior to Visit  Medication Sig Dispense Refill  . amoxicillin (AMOXIL) 500 MG capsule TAKE 4 CAPSULES BY MOUTH 1 HOUR BEFORE DENTAL APPOINTMENT 4 capsule 3  . benzonatate (TESSALON) 200 MG capsule Take 1 capsule (200 mg total) by mouth 3 (three) times daily as needed for cough. 90 capsule 5  . levothyroxine (SYNTHROID, LEVOTHROID) 88 MCG tablet TAKE 1 TABLET BY MOUTH DAILY 30 tablet 5  . meloxicam (MOBIC) 15 MG tablet Take 1 tablet (15 mg total) by mouth daily. 30 tablet 0  . MULTIPLE VITAMINS PO Take by mouth daily. Reported on 06/14/2015    . Naproxen Sod-Diphenhydramine (ALEVE PM PO) Take 1 tablet by mouth daily as needed. Reported on 07/14/2015    . sertraline (ZOLOFT) 100 MG tablet TAKE 1 TABLET BY MOUTH EVERY DAY 90 tablet 3   No current facility-administered medications on file prior to visit.     Allergies  Allergen Reactions  . Bupropion Hcl     REACTION: itching    Past Medical History:  Diagnosis Date  . Anemia    NOS  . Brain tumor ([place])    S/P resection with shunt  . Hypothyroidism     Past Surgical History:  Procedure Laterality Date  . Abdominal US  05/19/1999   Negative  . Abdominal US  08/10   Normal  . 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 TUBAL LIGATION      Family History  Problem Relation Age of Onset  . Thyroid disease Mother   . Thyroid disease Sister   . Heart disease Sister     Low heart rate  . Thyroid disease Brother   . Thyroid disease  Brother     Social History   Social History  . Marital status: Married    Spouse name: N/A  . Number of children: 1  . Years of education: N/A   Occupational History  . [employer]   Social History Main Topics  . Smoking status: Never Smoker  . Smokeless tobacco: Never Used  . Alcohol use 0.0 oz/week     Comment: Minimal  . Drug use: No  . Sexual activity: Not on file   Other Topics Concern  . Not on file   Social History Narrative   One son, one step-son.   From [place].   Review of Systems No fever Not sick No URI symptoms    Objective:   Physical Exam  Eyes: Conjunctivae and EOM are normal.  Moderate pale swelling of left upper lid Eye is quiet Slight warmth but not hot Not really tender          Assessment & Plan:

## 2016-02-17 NOTE — Assessment & Plan Note (Signed)
?  from the eye cream Not infected No conjunctival involvement  Discussed cool compresses Cetirizine 3 days of prednisone

## 2016-03-19 ENCOUNTER — Other Ambulatory Visit: Payer: Self-pay | Admitting: Family Medicine

## 2016-05-01 ENCOUNTER — Encounter (INDEPENDENT_AMBULATORY_CARE_PROVIDER_SITE_OTHER): Payer: Self-pay

## 2016-05-01 ENCOUNTER — Ambulatory Visit (INDEPENDENT_AMBULATORY_CARE_PROVIDER_SITE_OTHER): Payer: Federal, State, Local not specified - PPO | Admitting: Internal Medicine

## 2016-05-01 ENCOUNTER — Encounter: Payer: Self-pay | Admitting: Internal Medicine

## 2016-05-01 VITALS — BP 114/78 | HR 78 | Temp 98.1°F | Wt 188.8 lb

## 2016-05-01 DIAGNOSIS — H0236 Blepharochalasis left eye, unspecified eyelid: Secondary | ICD-10-CM

## 2016-05-01 DIAGNOSIS — H00014 Hordeolum externum left upper eyelid: Secondary | ICD-10-CM

## 2016-05-01 DIAGNOSIS — H0233 Blepharochalasis right eye, unspecified eyelid: Secondary | ICD-10-CM

## 2016-05-01 NOTE — Patient Instructions (Signed)
Stye A stye is a bump on your eyelid caused by a bacterial infection. A stye can form inside the eyelid (internal stye) or outside the eyelid (external stye). An internal stye may be caused by an infected oil-producing gland inside your eyelid. An external stye may be caused by an infection at the base of your eyelash (hair follicle). Styes are very common. Anyone can get them at any age. They usually occur in just one eye, but you may have more than one in either eye. What are the causes? The infection is almost always caused by bacteria called Staphylococcus aureus. This is a common type of bacteria that lives on your skin. What increases the risk? You may be at higher risk for a stye if you have had one before. You may also be at higher risk if you have:  Diabetes.  Long-term illness.  Long-term eye redness.  A skin condition called seborrhea.  High fat levels in your blood (lipids). What are the signs or symptoms? Eyelid pain is the most common symptom of a stye. Internal styes are more painful than external styes. Other signs and symptoms may include:  Painful swelling of your eyelid.  A scratchy feeling in your eye.  Tearing and redness of your eye.  Pus draining from the stye. How is this diagnosed? Your health care provider may be able to diagnose a stye just by examining your eye. The health care provider may also check to make sure:  You do not have a fever or other signs of a more serious infection.  The infection has not spread to other parts of your eye or areas around your eye. How is this treated? Most styes will clear up in a few days without treatment. In some cases, you may need to use antibiotic drops or ointment to prevent infection. Your health care provider may have to drain the stye surgically if your stye is:  Large.  Causing a lot of pain.  Interfering with your vision. This can be done using a thin blade or a needle. Follow these instructions at  home:  Take medicines only as directed by your health care provider.  Apply a clean, warm compress to your eye for 10 minutes, 4 times a day.  Do not wear contact lenses or eye makeup until your stye has healed.  Do not try to pop or drain the stye. Contact a health care provider if:  You have chills or a fever.  Your stye does not go away after several days.  Your stye affects your vision.  Your eyeball becomes swollen, red, or painful. This information is not intended to replace advice given to you by your health care provider. Make sure you discuss any questions you have with your health care provider. Document Released: 11/15/2004 Document Revised: 10/02/2015 Document Reviewed: 05/22/2013 Elsevier Interactive Patient Education  2017 [employer].

## 2016-05-01 NOTE — Progress Notes (Signed)
Subjective:    Patient ID: Janet Douglas, female    DOB: 31-Jan-1966, 51 y.o.   MRN: [id number]  HPI  Pt presents to the clinic today with c/o a lesion to her left eyelid. She first noticed this 2-3 days ago. It is tender but she has not noticed that it has drained. It is not impairing her vision. She has not tried anything OTC for this. She reports this has been a recurrent issue for her. She has discussed this with her eye doctor, who advised her that her drooping eyelids are likely causing the recurrent styes.  Review of Systems      Past Medical History:  Diagnosis Date  . Anemia    NOS  . Brain tumor ([place])    S/P resection with shunt  . Hypothyroidism     Current Outpatient Prescriptions  Medication Sig Dispense Refill  . amoxicillin (AMOXIL) 500 MG capsule TAKE 4 CAPSULES BY MOUTH 1 HOUR BEFORE DENTAL APPOINTMENT 4 capsule 3  . benzonatate (TESSALON) 200 MG capsule Take 1 capsule (200 mg total) by mouth 3 (three) times daily as needed for cough. 90 capsule 5  . levothyroxine (SYNTHROID, LEVOTHROID) 88 MCG tablet TAKE 1 TABLET BY MOUTH DAILY 30 tablet 5  . meloxicam (MOBIC) 15 MG tablet Take 1 tablet (15 mg total) by mouth daily. 30 tablet 0  . MULTIPLE VITAMINS PO Take by mouth daily. Reported on 06/14/2015    . Naproxen Sod-Diphenhydramine (ALEVE PM PO) Take 1 tablet by mouth daily as needed. Reported on 07/14/2015    . predniSONE (DELTASONE) 20 MG tablet Take 2 tablets (40 mg total) by mouth daily. 6 tablet 0  . sertraline (ZOLOFT) 100 MG tablet TAKE 1 TABLET BY MOUTH EVERY DAY 90 tablet 3   No current facility-administered medications for this visit.     Allergies  Allergen Reactions  . Bupropion Hcl     REACTION: itching    Family History  Problem Relation Age of Onset  . Thyroid disease Mother   . Thyroid disease Sister   . Heart disease Sister     Low heart rate  . Thyroid disease Brother   . Thyroid disease Brother     Social History   Social History    . Marital status: Married    Spouse name: N/A  . Number of children: 1  . Years of education: N/A   Occupational History  . [employer]   Social History Main Topics  . Smoking status: Never Smoker  . Smokeless tobacco: Never Used  . Alcohol use 0.0 oz/week     Comment: Minimal  . Drug use: No  . Sexual activity: Not on file   Other Topics Concern  . Not on file   Social History Narrative   One son, one step-son.   From [place].     Constitutional: Denies fever, malaise, fatigue, headache or abrupt weight changes.  HEENT: Pt reports lesion of left eyelid. Denies eye pain, eye redness, ear pain, ringing in the ears, wax buildup, runny nose, nasal congestion, bloody nose, or sore throat.  No other specific complaints in a complete review of systems (except as listed in HPI above).  Objective:   Physical Exam  BP 114/78   Pulse 78   Temp 98.1 F (36.7 C) (Oral)   Wt 188 lb 12 oz (85.6 kg)   SpO2 98%   BMI 30.47 kg/m  Wt Readings from Last 3 Encounters:  05/01/16 188  lb 12 oz (85.6 kg)  02/17/16 193 lb (87.5 kg)  08/31/15 196 lb (88.9 kg)    General: Appears her stated age, well developed, well nourished in NAD. HEENT: Eye: Stye noted of left upper eyelid. BMET    Component Value Date/Time   NA 136 09/26/2015 1157   K 4.2 09/26/2015 1157   CL 105 09/26/2015 1157   CO2 26 09/26/2015 1157   GLUCOSE 96 09/26/2015 1157   BUN 15 09/26/2015 1157   CREATININE 0.77 09/26/2015 1157   CALCIUM 9.3 09/26/2015 1157   GFRNONAA 83.00 10/05/2008 1007    Lipid Panel     Component Value Date/Time   CHOL 161 08/31/2014 1546   TRIG 281.0 (H) 08/31/2014 1546   HDL 40.20 08/31/2014 1546   CHOLHDL 4 08/31/2014 1546   VLDL 56.2 (H) 08/31/2014 1546   LDLCALC 79 04/14/2012 1002    CBC    Component Value Date/Time   WBC 10.1 08/31/2015 1239   RBC 5.53 (H) 08/31/2015 1239   HGB 11.5 (L) 08/31/2015 1239   HCT 36.3 08/31/2015 1239   PLT 212.0 08/31/2015  1239   MCV 65.7 Repeated and verified X2. (L) 08/31/2015 1239   MCHC 31.8 08/31/2015 1239   RDW 15.3 08/31/2015 1239   LYMPHSABS 1.8 08/31/2015 1239   MONOABS 0.5 08/31/2015 1239   EOSABS 0.1 08/31/2015 1239   BASOSABS 0.1 08/31/2015 1239    Hgb A1C Lab Results  Component Value Date   HGBA1C 5.5 08/31/2014           Assessment & Plan:   Recurrent Stye's:  I agree that the drooping eyelids are likely a contributing factor Advised her to try warm compresses TID If it does not resolve or enlarges, she will need to follow up with opthalmology  RTC as needed or if symptoms persist or worsen Mercede Rollo, NP

## 2016-09-12 ENCOUNTER — Other Ambulatory Visit: Payer: Self-pay | Admitting: Family Medicine

## 2016-09-12 NOTE — Telephone Encounter (Signed)
Pt hasn't had a TSH lab or recent CPE or f/u, and no future appts., please advise

## 2016-09-12 NOTE — Telephone Encounter (Signed)
Please schedule aug or sept f/u and refill until then  Can schedule labs prior Thanks

## 2016-09-13 NOTE — Telephone Encounter (Signed)
appt scheduled pt preferred same day labs, and med refilled

## 2016-10-11 ENCOUNTER — Other Ambulatory Visit: Payer: Self-pay | Admitting: Family Medicine

## 2016-10-16 ENCOUNTER — Ambulatory Visit: Payer: Federal, State, Local not specified - PPO | Admitting: Family Medicine

## 2016-10-24 ENCOUNTER — Ambulatory Visit (INDEPENDENT_AMBULATORY_CARE_PROVIDER_SITE_OTHER): Payer: Federal, State, Local not specified - PPO | Admitting: Family Medicine

## 2016-10-24 ENCOUNTER — Encounter: Payer: Self-pay | Admitting: Family Medicine

## 2016-10-24 ENCOUNTER — Encounter: Payer: Self-pay | Admitting: Gastroenterology

## 2016-10-24 VITALS — BP 114/78 | HR 52 | Temp 97.6°F | Ht 66.0 in | Wt 196.2 lb

## 2016-10-24 DIAGNOSIS — F4321 Adjustment disorder with depressed mood: Secondary | ICD-10-CM

## 2016-10-24 DIAGNOSIS — R5382 Chronic fatigue, unspecified: Secondary | ICD-10-CM

## 2016-10-24 DIAGNOSIS — Z6831 Body mass index (BMI) 31.0-31.9, adult: Secondary | ICD-10-CM | POA: Diagnosis not present

## 2016-10-24 DIAGNOSIS — Z13220 Encounter for screening for lipoid disorders: Secondary | ICD-10-CM | POA: Diagnosis not present

## 2016-10-24 DIAGNOSIS — D649 Anemia, unspecified: Secondary | ICD-10-CM | POA: Diagnosis not present

## 2016-10-24 DIAGNOSIS — E6609 Other obesity due to excess calories: Secondary | ICD-10-CM

## 2016-10-24 DIAGNOSIS — E02 Subclinical iodine-deficiency hypothyroidism: Secondary | ICD-10-CM

## 2016-10-24 DIAGNOSIS — Z1211 Encounter for screening for malignant neoplasm of colon: Secondary | ICD-10-CM

## 2016-10-24 DIAGNOSIS — R739 Hyperglycemia, unspecified: Secondary | ICD-10-CM

## 2016-10-24 DIAGNOSIS — R140 Abdominal distension (gaseous): Secondary | ICD-10-CM | POA: Diagnosis not present

## 2016-10-24 LAB — COMPREHENSIVE METABOLIC PANEL
ALBUMIN: 4.4 g/dL (ref 3.5–5.2)
ALK PHOS: 61 U/L (ref 39–117)
ALT: 11 U/L (ref 0–35)
AST: 16 U/L (ref 0–37)
BUN: 18 mg/dL (ref 6–23)
CHLORIDE: 102 meq/L (ref 96–112)
CO2: 27 mEq/L (ref 19–32)
Calcium: 9.8 mg/dL (ref 8.4–10.5)
Creatinine, Ser: 0.78 mg/dL (ref 0.40–1.20)
GFR: 82.56 mL/min (ref >60.00)
Glucose, Bld: 109 mg/dL — ABNORMAL HIGH (ref 70–99)
POTASSIUM: 4 meq/L (ref 3.5–5.1)
SODIUM: 137 meq/L (ref 135–145)
TOTAL PROTEIN: 7.3 g/dL (ref 6.0–8.3)
Total Bilirubin: 0.5 mg/dL (ref 0.2–1.2)

## 2016-10-24 LAB — CBC WITH DIFFERENTIAL/PLATELET
Basophils Absolute: 0.1 10*3/uL (ref 0.0–0.1)
Basophils Relative: 0.7 % (ref 0.0–3.0)
Eosinophils Absolute: 0.2 10*3/uL (ref 0.0–0.7)
Eosinophils Relative: 1.7 % (ref 0.0–5.0)
HCT: 37.4 % (ref 36.0–46.0)
HEMOGLOBIN: 11.6 g/dL — AB (ref 12.0–15.0)
LYMPHS ABS: 2 10*3/uL (ref 0.7–4.0)
Lymphocytes Relative: 21.5 % (ref 12.0–46.0)
MCHC: 31 g/dL (ref 30.0–36.0)
MONO ABS: 0.6 10*3/uL (ref 0.1–1.0)
MONOS PCT: 6.2 % (ref 3.0–12.0)
Neutro Abs: 6.4 10*3/uL (ref 1.4–7.7)
Neutrophils Relative %: 69.9 % (ref 43.0–77.0)
Platelets: 256 10*3/uL (ref 150.0–400.0)
RBC: 5.62 Mil/uL — AB (ref 3.87–5.11)
RDW: 15 % (ref 11.5–15.5)
WBC: 9.2 10*3/uL (ref 4.0–10.5)

## 2016-10-24 LAB — LIPID PANEL
CHOLESTEROL: 155 mg/dL (ref 0–200)
HDL: 38.4 mg/dL — AB (ref >39.00)
LDL CALC: 92 mg/dL (ref 0–99)
NonHDL: 116.67
TRIGLYCERIDES: 123 mg/dL (ref 0.0–149.0)
Total CHOL/HDL Ratio: 4
VLDL: 24.6 mg/dL (ref 0.0–40.0)

## 2016-10-24 LAB — HEMOGLOBIN A1C: Hgb A1c MFr Bld: 5.8 % (ref 4.6–6.5)

## 2016-10-24 LAB — TSH: TSH: 4.25 u[IU]/mL (ref 0.35–4.50)

## 2016-10-24 MED ORDER — SERTRALINE HCL 100 MG PO TABS: 100.0000 mg | ORAL_TABLET | Freq: Every day | ORAL | 3 refills | Status: DC

## 2016-10-24 NOTE — Assessment & Plan Note (Signed)
A1C today  disc imp of low glycemic diet and wt loss to prevent DM2  

## 2016-10-24 NOTE — Assessment & Plan Note (Signed)
Lipid profile drawn  Disc low sat/trans fat diet for good health

## 2016-10-24 NOTE — Assessment & Plan Note (Signed)
Hx of anemia  Pt no longer takes iron due to constipation it causes  Has been stable  Cbc today  More fatigued lately

## 2016-10-24 NOTE — Assessment & Plan Note (Signed)
TSH today  More fatigued  Also at menopause age  Will hold off refilling levothyroxine until results are back

## 2016-10-24 NOTE — Patient Instructions (Addendum)
Set an alarm on your cell phone to take your medicine  Notes or an index card may help also (in the drawer you keep your keys)   We will refer you to GI for a screening colonoscopy    To help with weight loss -keep exercising Try to get most of your carbohydrates from produce (with the exception of white potatoes)  Eat less bread/pasta/rice/snack foods/cereals/sweets and other items from the middle of the grocery store (processed carbs) The new weight watchers program is great if you are interested  Make sure to get protein with snacks and meals - meat/fish/nuts/beans/dairy/soy /eggs   We will refill thyroid medicine after labs return   Flu shot today

## 2016-10-24 NOTE — Assessment & Plan Note (Signed)
Ref for screening colonoscopy at pt's req nov or later

## 2016-10-24 NOTE — Assessment & Plan Note (Signed)
Pt thinks she is doing fairly well on zoloft but occ forgets is (hx of short term memory problems since her brain surgery)  Disc strategies for reminders incl cell phone alarm and sticky notes   Reviewed stressors/ coping techniques/symptoms/ support sources/ tx options and side effects in detail today

## 2016-10-24 NOTE — Assessment & Plan Note (Signed)
moreso in the past year  Lab today incl tsh and cbc Disc role of menopause  Urged to keep exercising

## 2016-10-24 NOTE — Progress Notes (Signed)
Subjective:    Patient ID: Janet Douglas, female    DOB: [birth year], 51 y.o.   MRN: [id number]  HPI Here for f/u of chronic health problems   Also GI issues - bloated all the time and decreased appetite  No pain  No blood in stool/ no stool change  No n/v  No heartburn  Also tired- ? Change of life/menopause  Feels blah   No hot flashes  Sleeps ok   She has not had a screening colonoscopy yet  Needs her husband to retire (NOV) to take her    Wt Readings from Last 3 Encounters:  10/24/16 196 lb 4 oz (89 kg)  05/01/16 188 lb 12 oz (85.6 kg)  02/17/16 193 lb (87.5 kg)  wants to loose wt (she has gained) - she has been exercising more (exercise bike and exercise bands)  Still walking route also  More fruits and veg and salads  31.68 kg/m  Flu shot -wants today   Hypothyroidism  Pt has no clinical changes No change in hair or skin/ edema and no tremor Lab Results  Component Value Date   TSH 3.15 08/31/2015    Due for TSH  Works hard to take her synthroid  More tired   Hyperglycemia Lab Results  Component Value Date   HGBA1C 5.5 08/31/2014   Due for labs   Hx of anemia  Lab Results  Component Value Date   WBC 10.1 08/31/2015   HGB 11.5 (L) 08/31/2015   HCT 36.3 08/31/2015   MCV 65.7 Repeated and verified X2. (L) 08/31/2015   PLT 212.0 08/31/2015   Hx of situational depression  Takes zoloft  She forgets to take that sometimes (memory was changed after her brain surgery)     Patient Active Problem List   Diagnosis Date Noted  . Bloating 10/24/2016  . Colon cancer screening 10/24/2016  . Allergic eye reaction 02/17/2016  . Vision changes 08/31/2015  . Perimenopausal 08/31/2015  . Nevus 08/31/2015  . Hyperglycemia 08/31/2014  . Fatigue 08/31/2014  . Perimenopause 08/31/2014  . Screening for lipoid disorders 08/31/2014  . Cough 01/14/2013  . History of brain tumor 11/28/2012  . Skin lesion of face 04/14/2012  . Obesity 06/05/2011  . Allergic  rhinitis 05/18/2010  . DEPRESSION, SITUATIONAL 06/15/2009  . ELEVATED BLOOD PRESSURE WITHOUT DIAGNOSIS OF HYPERTENSION 06/15/2009  . PLANTAR FASCIITIS 05/20/2008  . METATARSALGIA 05/06/2008  . OTHER ENTHESOPATHY OF ANKLE AND TARSUS 05/06/2008  . SESAMOIDITIS 11/04/2007  . NONORGANIC SLEEP DISORDER UNSPECIFIED 04/24/2007  . POOR CONCENTRATION 09/10/2006  . Hypothyroidism 09/09/2006  . Anemia 09/09/2006  . TRANSAMINASES, SERUM, ELEVATED 09/09/2006  . ABNORMAL PAP SMEAR 09/09/2006   Past Medical History:  Diagnosis Date  . Anemia    NOS  . Brain tumor ([place])    S/P resection with shunt  . Hypothyroidism    Past Surgical History:  Procedure Laterality Date  . Abdominal US  05/19/1999   Negative  . Abdominal US  08/10   Normal  . 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 TUBAL LIGATION     Social History  Substance Use Topics  . Smoking status: Never Smoker  . Smokeless tobacco: Never Used  . Alcohol use 0.0 oz/week     Comment: Minimal   Family History  Problem Relation Age of Onset  . Thyroid disease Mother   . Thyroid disease Sister   . Heart disease Sister        Low heart rate  .  Thyroid disease Brother   . Thyroid disease Brother    Allergies  Allergen Reactions  . Bupropion Hcl     REACTION: itching   Current Outpatient Prescriptions on File Prior to Visit  Medication Sig Dispense Refill  . amoxicillin (AMOXIL) 500 MG capsule TAKE 4 CAPSULES BY MOUTH 1 HOUR BEFORE DENTAL APPOINTMENT 4 capsule 3  . levothyroxine (SYNTHROID, LEVOTHROID) 88 MCG tablet TAKE 1 TABLET BY MOUTH DAILY 30 tablet 0  . meloxicam (MOBIC) 15 MG tablet Take 1 tablet (15 mg total) by mouth daily. 30 tablet 0  . MULTIPLE VITAMINS PO Take by mouth daily. Reported on 06/14/2015    . Naproxen Sod-Diphenhydramine (ALEVE PM PO) Take 1 tablet by mouth daily as needed. Reported on 07/14/2015     No current facility-administered medications on file prior to visit.     Review of Systems    Constitutional: Positive for fatigue. Negative for activity change, appetite change, fever and unexpected weight change.  HENT: Positive for postnasal drip. Negative for congestion, ear pain, rhinorrhea, sinus pressure and sore throat.   Eyes: Negative for pain, redness and visual disturbance.  Respiratory: Negative for cough, shortness of breath and wheezing.   Cardiovascular: Negative for chest pain and palpitations.  Gastrointestinal: Positive for abdominal distention. Negative for abdominal pain, blood in stool, constipation, diarrhea, nausea, rectal pain and vomiting.  Endocrine: Negative for polydipsia and polyuria.  Genitourinary: Negative for dysuria, frequency and urgency.  Musculoskeletal: Negative for arthralgias, back pain and myalgias.  Skin: Negative for pallor and rash.  Allergic/Immunologic: Negative for environmental allergies.  Neurological: Negative for dizziness, syncope and headaches.  Hematological: Negative for adenopathy. Does not bruise/bleed easily.  Psychiatric/Behavioral: Positive for decreased concentration. Negative for dysphoric mood. The patient is not nervous/anxious.        Objective:   Physical Exam  Constitutional: She appears well-developed and well-nourished. No distress.  obese and well appearing   HENT:  Head: Normocephalic and atraumatic.  Mouth/Throat: Oropharynx is clear and moist.  Eyes: Pupils are equal, round, and reactive to light. Conjunctivae and EOM are normal. No scleral icterus.  Neck: Normal range of motion. Neck supple. No JVD present. Carotid bruit is not present. No thyromegaly present.  Cardiovascular: Normal rate, regular rhythm, normal heart sounds and intact distal pulses.  Exam reveals no gallop.   Pulmonary/Chest: Effort normal and breath sounds normal. No respiratory distress. She has no wheezes. She has no rales.  No crackles  Abdominal: Soft. Bowel sounds are normal. She exhibits no distension, no abdominal bruit and no  mass. There is no tenderness. There is no rebound and no guarding.  Musculoskeletal: She exhibits no edema.  Lymphadenopathy:    She has no cervical adenopathy.  Neurological: She is alert. She has normal reflexes. She displays no tremor.  Skin: Skin is warm and dry. No rash noted. No erythema. No pallor.  Psychiatric: She has a normal mood and affect. Cognition and memory are not impaired.          Assessment & Plan:   Problem List Items Addressed This Visit      Endocrine   Hypothyroidism - Primary    TSH today  More fatigued  Also at menopause age  Will hold off refilling levothyroxine until results are back       Relevant Orders   TSH (Completed)     Other   Anemia    Hx of anemia  Pt no longer takes iron due to constipation it causes  Has been stable  Cbc today  More fatigued lately      Relevant Orders   CBC with Differential/Platelet (Completed)   Bloating    More bloating may be due to change in diet (more produce) and poss from menopause  Will schedule her screening colonoscopy as well  Adv to avoid carbonation/ use of straws to prevent swallowing air as well as artificial sweeteners       Relevant Orders   CBC with Differential/Platelet (Completed)   Comprehensive metabolic panel (Completed)   Colon cancer screening    Ref for screening colonoscopy at pt's req nov or later        Relevant Orders   Ambulatory referral to Gastroenterology   DEPRESSION, SITUATIONAL    Pt thinks she is doing fairly well on zoloft but occ forgets is (hx of short term memory problems since her brain surgery)  Disc strategies for reminders incl cell phone alarm and sticky notes   Reviewed stressors/ coping techniques/symptoms/ support sources/ tx options and side effects in detail today        Fatigue    moreso in the past year  Lab today incl tsh and cbc Disc role of menopause  Urged to keep exercising       Hyperglycemia    A1C today  disc imp of low  glycemic diet and wt loss to prevent DM2       Relevant Orders   Comprehensive metabolic panel (Completed)   Hemoglobin A1c (Completed)   Obesity    Discussed how this problem influences overall health and the risks it imposes  Reviewed plan for weight loss with lower calorie diet (via better food choices and also portion control or program like weight watchers) and exercise building up to or more than 30 minutes 5 days per week including some aerobic activity   She will try getting more protein and less refined carbs and sweets  Continue exercise       Screening for lipoid disorders    Lipid profile drawn  Disc low sat/trans fat diet for good health       Relevant Orders   Lipid panel (Completed)

## 2016-10-24 NOTE — Assessment & Plan Note (Signed)
More bloating may be due to change in diet (more produce) and poss from menopause  Will schedule her screening colonoscopy as well  Adv to avoid carbonation/ use of straws to prevent swallowing air as well as artificial sweeteners

## 2016-10-24 NOTE — Assessment & Plan Note (Signed)
Discussed how this problem influences overall health and the risks it imposes  Reviewed plan for weight loss with lower calorie diet (via better food choices and also portion control or program like weight watchers) and exercise building up to or more than 30 minutes 5 days per week including some aerobic activity   She will try getting more protein and less refined carbs and sweets  Continue exercise

## 2016-10-25 ENCOUNTER — Encounter: Payer: Self-pay | Admitting: *Deleted

## 2016-11-12 ENCOUNTER — Other Ambulatory Visit: Payer: Self-pay | Admitting: Family Medicine

## 2016-12-17 ENCOUNTER — Ambulatory Visit (AMBULATORY_SURGERY_CENTER): Payer: Self-pay

## 2016-12-17 VITALS — Ht 66.0 in | Wt 200.0 lb

## 2016-12-17 DIAGNOSIS — Z1211 Encounter for screening for malignant neoplasm of colon: Secondary | ICD-10-CM

## 2016-12-17 MED ORDER — SUPREP BOWEL PREP KIT 17.5-3.13-1.6 GM/177ML PO SOLN: 1.0000 | Freq: Once | ORAL | 0 refills | Status: AC

## 2016-12-17 NOTE — Progress Notes (Signed)
No allergies to eggs or soy No past problems with anesthesia No diet meds No home oxygen  Registered emmi

## 2016-12-18 ENCOUNTER — Encounter: Payer: Self-pay | Admitting: Gastroenterology

## 2016-12-24 ENCOUNTER — Ambulatory Visit (AMBULATORY_SURGERY_CENTER): Payer: Federal, State, Local not specified - PPO | Admitting: Gastroenterology

## 2016-12-24 ENCOUNTER — Encounter: Payer: Self-pay | Admitting: Gastroenterology

## 2016-12-24 VITALS — BP 125/78 | HR 60 | Temp 97.8°F | Resp 16 | Ht 66.0 in | Wt 200.0 lb

## 2016-12-24 DIAGNOSIS — K649 Unspecified hemorrhoids: Secondary | ICD-10-CM

## 2016-12-24 DIAGNOSIS — Z1212 Encounter for screening for malignant neoplasm of rectum: Secondary | ICD-10-CM

## 2016-12-24 DIAGNOSIS — Z1211 Encounter for screening for malignant neoplasm of colon: Secondary | ICD-10-CM | POA: Diagnosis not present

## 2016-12-24 MED ORDER — SODIUM CHLORIDE 0.9 % IV SOLN: 500.0000 mL | INTRAVENOUS | Status: DC

## 2016-12-24 NOTE — Op Note (Signed)
[facility] Patient Name: Janet Douglas Procedure Date: 12/24/2016 9:56 AM MRN: [id number] Endoscopist: Milus Banister , MD Age: 51 Referring MD:  Date of Birth: Feb 23, 1965 Gender: Female Account #: [id number] Procedure:                Colonoscopy Indications:              Screening for colorectal malignant neoplasm Medicines:                Monitored Anesthesia Care Procedure:                Pre-Anesthesia Assessment:                           - Prior to the procedure, a History and Physical                            was performed, and patient medications and                            allergies were reviewed. The patient's tolerance of                            previous anesthesia was also reviewed. The risks                            and benefits of the procedure and the sedation                            options and risks were discussed with the patient.                            All questions were answered, and informed consent                            was obtained. Prior Anticoagulants: The patient has                            taken no previous anticoagulant or antiplatelet                            agents. ASA Grade Assessment: II - A patient with                            mild systemic disease. After reviewing the risks                            and benefits, the patient was deemed in                            satisfactory condition to undergo the procedure.                           After obtaining informed consent, the colonoscope  was passed under direct vision. Throughout the                            procedure, the patient's blood pressure, pulse, and                            oxygen saturations were monitored continuously. The                            Colonoscope was introduced through the anus and                            advanced to the the cecum, identified by                            appendiceal orifice and  ileocecal valve. The                            colonoscopy was performed without difficulty. The                            patient tolerated the procedure well. The quality                            of the bowel preparation was good. The ileocecal                            valve, appendiceal orifice, and rectum were                            photographed. Scope In: 10:00:40 AM Scope Out: 10:20:18 AM Scope Withdrawal Time: 0 hours 14 minutes 25 seconds  Total Procedure Duration: 0 hours 19 minutes 38 seconds  Findings:                 Small internal hemorrhoids with small, early anal                            skin tag vs. lipoma.                           The exam was otherwise without abnormality. Complications:            No immediate complications. Estimated blood loss:                            None. Estimated Blood Loss:     Estimated blood loss: none. Impression:               - Internal hemorrhoids.                           - The examination was otherwise normal.                           - No specimens collected. Recommendation:           -  Patient has a contact number available for                            emergencies. The signs and symptoms of potential                            delayed complications were discussed with the                            patient. Return to normal activities tomorrow.                            Written discharge instructions were provided to the                            patient.                           - Resume previous diet.                           - Continue present medications.                           - Repeat colonoscopy in 10 years for screening                            purposes. Milus Banister, MD 12/24/2016 10:22:59 AM This report has been signed electronically.

## 2016-12-24 NOTE — Progress Notes (Signed)
Report to PACU, RN, vss, BBS= Clear.  

## 2016-12-24 NOTE — Progress Notes (Signed)
Pt's states no medical or surgical changes since previsit or office visit. 

## 2016-12-24 NOTE — Patient Instructions (Signed)
YOU HAD AN ENDOSCOPIC PROCEDURE TODAY AT [facility]:   Refer to the procedure report that was given to you for any specific questions about what was found during the examination.  If the procedure report does not answer your questions, please call your gastroenterologist to clarify.  If you requested that your care partner not be given the details of your procedure findings, then the procedure report has been included in a sealed envelope for you to review at your convenience later.  YOU SHOULD EXPECT: Some feelings of bloating in the abdomen. Passage of more gas than usual.  Walking can help get rid of the air that was put into your GI tract during the procedure and reduce the bloating. If you had a lower endoscopy (such as a colonoscopy or flexible sigmoidoscopy) you may notice spotting of blood in your stool or on the toilet paper. If you underwent a bowel prep for your procedure, you may not have a normal bowel movement for a few days.  Please Note:  You might notice some irritation and congestion in your nose or some drainage.  This is from the oxygen used during your procedure.  There is no need for concern and it should clear up in a day or so.  SYMPTOMS TO REPORT IMMEDIATELY:   Following lower endoscopy (colonoscopy or flexible sigmoidoscopy):  Excessive amounts of blood in the stool  Significant tenderness or worsening of abdominal pains  Swelling of the abdomen that is new, acute  Fever of 100F or higher  For urgent or emergent issues, a gastroenterologist can be reached at any hour by calling [phone].   DIET:  We do recommend a small meal at first, but then you may proceed to your regular diet.  Drink plenty of fluids but you should avoid alcoholic beverages for 24 hours.  ACTIVITY:  You should plan to take it easy for the rest of today and you should NOT DRIVE or use heavy machinery until tomorrow (because of the sedation medicines used during the test).     FOLLOW UP: Our staff will call the number listed on your records the next business day following your procedure to check on you and address any questions or concerns that you may have regarding the information given to you following your procedure. If we do not reach you, we will leave a message.  However, if you are feeling well and you are not experiencing any problems, there is no need to return our call.  We will assume that you have returned to your regular daily activities without incident.  If any biopsies were taken you will be contacted by phone or by letter within the next 1-3 weeks.  Please call us at [phone] if you have not heard about the biopsies in 3 weeks.   Repeat colonoscopy screening in 10 years Hemorrhoids (handout given)   SIGNATURES/CONFIDENTIALITY: You and/or your care partner have signed paperwork which will be entered into your electronic medical record.  These signatures attest to the fact that that the information above on your After Visit Summary has been reviewed and is understood.  Full responsibility of the confidentiality of this discharge information lies with you and/or your care-partner.

## 2016-12-25 ENCOUNTER — Telehealth: Payer: Self-pay

## 2016-12-25 ENCOUNTER — Telehealth: Payer: Self-pay | Admitting: *Deleted

## 2016-12-25 DIAGNOSIS — H02403 Unspecified ptosis of bilateral eyelids: Secondary | ICD-10-CM

## 2016-12-25 NOTE — Telephone Encounter (Signed)
Pt was seen 10/24/16 but do not see mention of eye issue.Please advise.

## 2016-12-25 NOTE — Telephone Encounter (Signed)
  Follow up Call-  Call back number 12/24/2016  Post procedure Call Back phone  # [phone]  Permission to leave phone message Yes  Some recent data might be hidden     Patient questions:  Do you have a fever, pain , or abdominal swelling? No. Pain Score  0 *  Have you tolerated food without any problems? Yes.    Have you been able to return to your normal activities? Yes.    Do you have any questions about your discharge instructions: Diet   No. Medications  No. Follow up visit  No.  Do you have questions or concerns about your Care? No.  Actions: * If pain score is 4 or above: No action needed, pain <4.

## 2016-12-25 NOTE — Telephone Encounter (Signed)
Opthy Appt made with Dr Wallace Going on 12/27/16 at 2:50pm , patient notified.

## 2016-12-25 NOTE — Telephone Encounter (Signed)
Copied from [facility] #[id number]. Topic: Referral - Request >> Dec 25, 2016  9:54 AM Conception Chancy, NT wrote: Reason for CRM: pt is wanting to see a eye specialist because her eyes are still heavy and are not getting better. Also Dr. Glori Bickers will be out of office until later this month and she would like to be seen by a eye doctor asap. Please contact pt.

## 2016-12-25 NOTE — Telephone Encounter (Signed)
Done  Will forward to [facility]

## 2016-12-27 DIAGNOSIS — H02403 Unspecified ptosis of bilateral eyelids: Secondary | ICD-10-CM | POA: Diagnosis not present

## 2017-02-28 DIAGNOSIS — H02834 Dermatochalasis of left upper eyelid: Secondary | ICD-10-CM | POA: Diagnosis not present

## 2017-02-28 DIAGNOSIS — H02831 Dermatochalasis of right upper eyelid: Secondary | ICD-10-CM | POA: Diagnosis not present

## 2017-03-06 ENCOUNTER — Telehealth: Payer: Self-pay | Admitting: *Deleted

## 2017-03-06 NOTE — Telephone Encounter (Signed)
I would assume her neurosurgeon would have her take amoxicillin like with a dental procedure - but she can call that office for more specifics if needed  I assume you mean eyelid surgery, right?

## 2017-03-06 NOTE — Telephone Encounter (Signed)
Copied from [place] [id number]. Topic: General - Other >> Mar 06, 2017  2:20 PM Carolyn [facility] wrote:  Pt said she is having eyelet surgery in February and is asking if there is anything extra she needs to do. She said she has to take amoxicillin when she goes to the dentist   Would like a call back [phone]

## 2017-03-08 NOTE — Telephone Encounter (Signed)
Pt notified of Dr. Marliss Coots comments and she will check with surgeon

## 2017-03-15 ENCOUNTER — Other Ambulatory Visit: Payer: Self-pay | Admitting: Family Medicine

## 2017-03-15 NOTE — Telephone Encounter (Signed)
Not on med list and last time it was prescribed was 01/16/16, please advise

## 2017-04-10 DIAGNOSIS — H5211 Myopia, right eye: Secondary | ICD-10-CM | POA: Diagnosis not present

## 2017-05-01 DIAGNOSIS — Z01419 Encounter for gynecological examination (general) (routine) without abnormal findings: Secondary | ICD-10-CM | POA: Diagnosis not present

## 2017-05-01 DIAGNOSIS — Z6830 Body mass index (BMI) 30.0-30.9, adult: Secondary | ICD-10-CM | POA: Diagnosis not present

## 2017-05-01 DIAGNOSIS — Z1231 Encounter for screening mammogram for malignant neoplasm of breast: Secondary | ICD-10-CM | POA: Diagnosis not present

## 2017-05-01 DIAGNOSIS — Z130 Encounter for screening for diseases of the blood and blood-forming organs and certain disorders involving the immune mechanism: Secondary | ICD-10-CM | POA: Diagnosis not present

## 2017-05-02 DIAGNOSIS — Z124 Encounter for screening for malignant neoplasm of cervix: Secondary | ICD-10-CM | POA: Diagnosis not present

## 2017-05-02 DIAGNOSIS — H02834 Dermatochalasis of left upper eyelid: Secondary | ICD-10-CM | POA: Diagnosis not present

## 2017-05-02 DIAGNOSIS — H02831 Dermatochalasis of right upper eyelid: Secondary | ICD-10-CM | POA: Diagnosis not present

## 2017-05-02 LAB — HM PAP SMEAR

## 2017-05-06 ENCOUNTER — Other Ambulatory Visit: Payer: Self-pay | Admitting: Obstetrics and Gynecology

## 2017-05-06 DIAGNOSIS — R928 Other abnormal and inconclusive findings on diagnostic imaging of breast: Secondary | ICD-10-CM

## 2017-05-09 ENCOUNTER — Other Ambulatory Visit: Payer: Federal, State, Local not specified - PPO

## 2017-05-09 ENCOUNTER — Ambulatory Visit
Admission: RE | Admit: 2017-05-09 | Discharge: 2017-05-09 | Disposition: A | Discharge status: Home / Self Care | Payer: 59 | Source: Ambulatory Visit | Attending: Obstetrics and Gynecology | Admitting: Obstetrics and Gynecology

## 2017-05-09 ENCOUNTER — Ambulatory Visit: Payer: Federal, State, Local not specified - PPO

## 2017-05-09 DIAGNOSIS — R928 Other abnormal and inconclusive findings on diagnostic imaging of breast: Secondary | ICD-10-CM

## 2017-05-11 ENCOUNTER — Other Ambulatory Visit: Payer: Self-pay | Admitting: Family Medicine

## 2017-05-20 ENCOUNTER — Other Ambulatory Visit: Payer: Self-pay

## 2017-05-20 ENCOUNTER — Encounter: Payer: Self-pay | Admitting: Family Medicine

## 2017-05-20 ENCOUNTER — Ambulatory Visit: Payer: 59 | Admitting: Family Medicine

## 2017-05-20 VITALS — BP 120/76 | HR 69 | Temp 97.9°F | Ht 66.0 in | Wt 196.5 lb

## 2017-05-20 DIAGNOSIS — M7661 Achilles tendinitis, right leg: Secondary | ICD-10-CM

## 2017-05-20 DIAGNOSIS — M6788 Other specified disorders of synovium and tendon, other site: Secondary | ICD-10-CM

## 2017-05-20 DIAGNOSIS — M7670 Peroneal tendinitis, unspecified leg: Secondary | ICD-10-CM | POA: Insufficient documentation

## 2017-05-20 DIAGNOSIS — M7672 Peroneal tendinitis, left leg: Secondary | ICD-10-CM | POA: Diagnosis not present

## 2017-05-20 NOTE — Progress Notes (Signed)
Dr. Frederico Hamman T. Connie Lasater, MD, [facility] [place], [address] Phone: [phone] Fax: [phone]  05/20/2017  Patient: Janet Douglas, MRN: [id number], DOB: 1965-09-30, 52 y.o.  Primary Physician:  Tower, Wynelle Fanny, MD   Chief Complaint  Patient presents with  . Foot Pain    Bilateral   Subjective:   Pleasant patient who presents with a 2 mo history of R posterior heel pain and L lateral foot pain. No occult, abrupt onset. Has been more insidious in character. There is a dull ache present and worse with activity:  Prior home rehab: none Prior meds: nsaids and tylenol. Orthosis / Braces: none  The PMH, PSH, Social History, Family History, Medications, and allergies have been reviewed in [facility], and have been updated if relevant.  Patient Active Problem List   Diagnosis Date Noted  . Peroneal tendinitis 05/20/2017  . Drooping eyelid, bilateral 12/25/2016  . Bloating 10/24/2016  . Colon cancer screening 10/24/2016  . Allergic eye reaction 02/17/2016  . Vision changes 08/31/2015  . Perimenopausal 08/31/2015  . Nevus 08/31/2015  . Hyperglycemia 08/31/2014  . Fatigue 08/31/2014  . Perimenopause 08/31/2014  . Screening for lipoid disorders 08/31/2014  . Cough 01/14/2013  . History of brain tumor 11/28/2012  . Skin lesion of face 04/14/2012  . Obesity 06/05/2011  . Allergic rhinitis 05/18/2010  . DEPRESSION, SITUATIONAL 06/15/2009  . ELEVATED BLOOD PRESSURE WITHOUT DIAGNOSIS OF HYPERTENSION 06/15/2009  . PLANTAR FASCIITIS 05/20/2008  . METATARSALGIA 05/06/2008  . OTHER ENTHESOPATHY OF ANKLE AND TARSUS 05/06/2008  . SESAMOIDITIS 11/04/2007  . NONORGANIC SLEEP DISORDER UNSPECIFIED 04/24/2007  . POOR CONCENTRATION 09/10/2006  . Hypothyroidism 09/09/2006  . Anemia 09/09/2006  . TRANSAMINASES, SERUM, ELEVATED 09/09/2006  . ABNORMAL PAP SMEAR 09/09/2006    Past Medical History:  Diagnosis Date  . Anemia    NOS  .  Brain tumor ([place])    S/P resection with shunt  . Hypothyroidism     Past Surgical History:  Procedure Laterality Date  . Abdominal US  05/19/1999   Negative  . Abdominal US  08/10   Normal  . 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 TUBAL LIGATION      Social History   Socioeconomic History  . Marital status: Married    Spouse name: Not on file  . Number of children: 1  . Years of education: Not on file  . Highest education level: Not on file  Occupational History  . Occupation: Buyer, retail: [employer]  Social Needs  . Financial resource strain: Not on file  . Food insecurity:    Worry: Not on file    Inability: Not on file  . Transportation needs:    Medical: Not on file    Non-medical: Not on file  Tobacco Use  . Smoking status: Never Smoker  . Smokeless tobacco: Never Used  Substance and Sexual Activity  . Alcohol use: Yes    Alcohol/week: 0.0 oz    Comment: Minimal  . Drug use: No  . Sexual activity: Not on file  Lifestyle  . Physical activity:    Days per week: Not on file    Minutes per session: Not on file  . Stress: Not on file  Relationships  . Social connections:    Talks on phone: Not on file    Gets together: Not on file    Attends religious service: Not on file  Active member of club or organization: Not on file    Attends meetings of clubs or organizations: Not on file    Relationship status: Not on file  . Intimate partner violence:    Fear of current or ex partner: Not on file    Emotionally abused: Not on file    Physically abused: Not on file    Forced sexual activity: Not on file  Other Topics Concern  . Not on file  Social History Narrative   One son, one step-son.   From [name].    Family History  Problem Relation Age of Onset  . Thyroid disease Mother   . Thyroid disease Sister   . Heart disease Sister        Low heart rate  . Thyroid disease Brother   . Thyroid disease Brother   . Colon  cancer Neg Hx     Allergies  Allergen Reactions  . Bupropion Hcl     REACTION: itching    Medication list reviewed and updated in full in [facility].  GEN: No fevers, chills. Nontoxic. Primarily MSK c/o today. MSK: Detailed in the HPI GI: tolerating PO intake without difficulty Neuro: No numbness, parasthesias, or tingling associated. Otherwise the pertinent positives of the ROS are noted above.   Objective:   Blood pressure 120/76, pulse 69, temperature 97.9 F (36.6 C), temperature source Oral, height 5\' 6"  (1.676 m), weight 196 lb 8 oz (89.1 kg).   GEN: Well-developed,well-nourished,in no acute distress; alert,appropriate and cooperative throughout examination HEENT: Normocephalic and atraumatic without obvious abnormalities. Ears, externally no deformities PULM: Breathing comfortably in no respiratory distress EXT: No clubbing, cyanosis, or edema PSYCH: Normally interactive. Cooperative during the interview. Pleasant. Friendly and conversant. Not anxious or depressed appearing. Normal, full affect.  Foot: B Echymosis: no Edema: no ROM: full LE B Gait: heel toe, non-antalgic MT pain: no Callus pattern: none Lateral Mall: NT Medial Mall: NT Talus: NT Navicular: NT Cuboid: NT Calcaneous: NT Metatarsals: NT 5th MT: NT Phalanges: NT Achilles: PAINFUL TO PALPATE AT INSERTION ON R, SMALL NODULE Plantar Fascia: NT Fat Pad: NT Peroneals: L TTP Post Tib: NT Great Toe: Nml motion Ant Drawer: neg ATFL: NT CFL: NT Deltoid: NT Sensation: intact  Radiology: No results found.  Assessment and Plan:   Achilles tendinitis, right leg  Left peroneal tendinosis  >25 minutes spent in face to face time with patient, >50% spent in counselling or coordination of care   Pathophysiology of achilles tendinopathy reviewed and peroneal on the L.  Additionally, I have given the patient the program emphasizing eccentric overloading detailed in the instructions based on  Dr. Trudi Ida work and protocols. - basic peroneal tendon rehab also reviewed. ASO brace for when on feet a lot.   Supportive footwear reviewed.  Refer to the patient instructions sections for details of plan shared with patient.   Follow-up: No follow-ups on file.  Patient Instructions  Peroneal tendon and arch rehab  Begin with easy walking, heel, toe and backwards * Try to pick an easy location like a hallway or a room in your house and do one of these each time that you go through this area.  Towel scrunch-ups Use a hand towel or a moderate size towel Foot flat down on the towel Use toes to "scrunch up the towel" straight up and down, and going to the right and left.  3 sets of 20 * Can be done watching TV, reading, or sitting and relaxing.  Achilles Rehab Calf raises on a step - wait until you are back from your cruise  First lower and then raise on 1 foot If this is painful lower on 1 foot but do the heel raise on both feet  Begin with 3 sets of 10 repetitions  Increase by 5 repetitions every 3 days  Goal is 3 sets of 30 repetitions  Do with both knee straight and knee at 20 degrees of flexion  If pain persists at 3 sets of 30 - add backpack with 5 lbs Increase by 5 lbs per week to max of 30 lbs     Signed,  Braedin Millhouse T. Shalaya Swailes, MD   Patient's Medications  New Prescriptions   No medications on file  Previous Medications   AMOXICILLIN (AMOXIL) 500 MG CAPSULE    TAKE 4 CAPSULES BY MOUTH 1 HOUR BEFORE DENTAL APPOINTMENT   LEVOTHYROXINE (SYNTHROID, LEVOTHROID) 88 MCG TABLET    TAKE 1 TABLET BY MOUTH DAILY   MELOXICAM (MOBIC) 15 MG TABLET    Take 1 tablet (15 mg total) by mouth daily.   MULTIPLE VITAMINS PO    Take by mouth daily. Reported on 06/14/2015   NAPROXEN SOD-DIPHENHYDRAMINE (ALEVE PM PO)    Take 1 tablet by mouth daily as needed. Reported on 07/14/2015   SERTRALINE (ZOLOFT) 100 MG TABLET    Take 1 tablet (100 mg total) by mouth daily.  Modified  Medications   No medications on file  Discontinued Medications   No medications on file

## 2017-05-20 NOTE — Patient Instructions (Signed)
Peroneal tendon and arch rehab  Begin with easy walking, heel, toe and backwards * Try to pick an easy location like a hallway or a room in your house and do one of these each time that you go through this area.  Towel scrunch-ups Use a hand towel or a moderate size towel Foot flat down on the towel Use toes to "scrunch up the towel" straight up and down, and going to the right and left.  3 sets of 20 * Can be done watching TV, reading, or sitting and relaxing.    Achilles Rehab Calf raises on a step - wait until you are back from your cruise  First lower and then raise on 1 foot If this is painful lower on 1 foot but do the heel raise on both feet  Begin with 3 sets of 10 repetitions  Increase by 5 repetitions every 3 days  Goal is 3 sets of 30 repetitions  Do with both knee straight and knee at 20 degrees of flexion  If pain persists at 3 sets of 30 - add backpack with 5 lbs Increase by 5 lbs per week to max of 30 lbs

## 2017-07-03 ENCOUNTER — Ambulatory Visit: Payer: 59 | Admitting: Family Medicine

## 2017-07-03 ENCOUNTER — Encounter: Payer: Self-pay | Admitting: Family Medicine

## 2017-07-03 VITALS — BP 120/84 | HR 67 | Temp 97.7°F | Ht 66.0 in | Wt 197.5 lb

## 2017-07-03 DIAGNOSIS — M7671 Peroneal tendinitis, right leg: Secondary | ICD-10-CM | POA: Diagnosis not present

## 2017-07-03 DIAGNOSIS — M6788 Other specified disorders of synovium and tendon, other site: Secondary | ICD-10-CM

## 2017-07-03 DIAGNOSIS — M1711 Unilateral primary osteoarthritis, right knee: Secondary | ICD-10-CM | POA: Diagnosis not present

## 2017-07-03 DIAGNOSIS — M7661 Achilles tendinitis, right leg: Secondary | ICD-10-CM | POA: Diagnosis not present

## 2017-07-03 DIAGNOSIS — M25561 Pain in right knee: Secondary | ICD-10-CM

## 2017-07-03 MED ORDER — METHYLPREDNISOLONE ACETATE 40 MG/ML IJ SUSP
80.0000 mg | Freq: Once | INTRAMUSCULAR | Status: AC
  Administered 2017-07-03: 80 mg via INTRA_ARTICULAR

## 2017-07-03 MED ORDER — MELOXICAM 15 MG PO TABS: 15.0000 mg | ORAL_TABLET | Freq: Every day | ORAL | 1 refills | Status: DC | PRN

## 2017-07-03 NOTE — Progress Notes (Signed)
Dr. Frederico Hamman T. Jacky Hartung, MD, [facility] [place], [address] Phone: [phone] Fax: [phone]  07/03/2017  Patient: Janet Douglas, MRN: [id number], DOB: 05/09/1965, 52 y.o.  Primary Physician:  Tower, Wynelle Fanny, MD   Chief Complaint  Patient presents with  . Follow-up    Right Achilles Tendinitis   Subjective:   Janet Douglas is a 52 y.o. very pleasant female patient who presents with the following:  3-4 mo R achilles tendonitis and L lateral foot pain.  These have been making some interval improvement since the last time I saw her, she has been compliant with doing her rehab.  She also has been using her ASO brace when she is at work, but she is up on her feet still quite a bit when she works for the [employer].  She is having some persistent right knee pain, and she has been found to have some osteoarthritis in the past by Dr. Noemi Chapel.  She has had some good relief from prior corticosteroid injections, and she is interested to know if I can do that for her here today.  Past Medical History, Surgical History, Social History, Family History, Problem List, Medications, and Allergies have been reviewed and updated if relevant.  Patient Active Problem List   Diagnosis Date Noted  . Peroneal tendinitis 05/20/2017  . Drooping eyelid, bilateral 12/25/2016  . Bloating 10/24/2016  . Colon cancer screening 10/24/2016  . Allergic eye reaction 02/17/2016  . Vision changes 08/31/2015  . Perimenopausal 08/31/2015  . Nevus 08/31/2015  . Hyperglycemia 08/31/2014  . Fatigue 08/31/2014  . Perimenopause 08/31/2014  . Screening for lipoid disorders 08/31/2014  . Cough 01/14/2013  . History of brain tumor 11/28/2012  . Skin lesion of face 04/14/2012  . Obesity 06/05/2011  . Allergic rhinitis 05/18/2010  . DEPRESSION, SITUATIONAL 06/15/2009  . ELEVATED BLOOD PRESSURE WITHOUT DIAGNOSIS OF HYPERTENSION 06/15/2009  . PLANTAR  FASCIITIS 05/20/2008  . METATARSALGIA 05/06/2008  . OTHER ENTHESOPATHY OF ANKLE AND TARSUS 05/06/2008  . SESAMOIDITIS 11/04/2007  . NONORGANIC SLEEP DISORDER UNSPECIFIED 04/24/2007  . POOR CONCENTRATION 09/10/2006  . Hypothyroidism 09/09/2006  . Anemia 09/09/2006  . TRANSAMINASES, SERUM, ELEVATED 09/09/2006  . ABNORMAL PAP SMEAR 09/09/2006    Past Medical History:  Diagnosis Date  . Anemia    NOS  . Brain tumor ([place])    S/P resection with shunt  . Hypothyroidism     Past Surgical History:  Procedure Laterality Date  . Abdominal US  05/19/1999   Negative  . Abdominal US  08/10   Normal  . 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 TUBAL LIGATION      Social History   Socioeconomic History  . Marital status: Married    Spouse name: Not on file  . Number of children: 1  . Years of education: Not on file  . Highest education level: Not on file  Occupational History  . Occupation: Buyer, retail: [employer]  Social Needs  . Financial resource strain: Not on file  . Food insecurity:    Worry: Not on file    Inability: Not on file  . Transportation needs:    Medical: Not on file    Non-medical: Not on file  Tobacco Use  . Smoking status: Never Smoker  . Smokeless tobacco: Never Used  Substance and Sexual Activity  . Alcohol use: Yes    Alcohol/week: 0.0  oz    Comment: Minimal  . Drug use: No  . Sexual activity: Not on file  Lifestyle  . Physical activity:    Days per week: Not on file    Minutes per session: Not on file  . Stress: Not on file  Relationships  . Social connections:    Talks on phone: Not on file    Gets together: Not on file    Attends religious service: Not on file    Active member of club or organization: Not on file    Attends meetings of clubs or organizations: Not on file    Relationship status: Not on file  . Intimate partner violence:    Fear of current or ex partner: Not on file    Emotionally abused: Not  on file    Physically abused: Not on file    Forced sexual activity: Not on file  Other Topics Concern  . Not on file  Social History Narrative   One son, one step-son.   From [place].    Family History  Problem Relation Age of Onset  . Thyroid disease Mother   . Thyroid disease Sister   . Heart disease Sister        Low heart rate  . Thyroid disease Brother   . Thyroid disease Brother   . Colon cancer Neg Hx     Allergies  Allergen Reactions  . Bupropion Hcl     REACTION: itching    Medication list reviewed and updated in full in [facility].  GEN: No fevers, chills. Nontoxic. Primarily MSK c/o today. MSK: Detailed in the HPI GI: tolerating PO intake without difficulty Neuro: No numbness, parasthesias, or tingling associated. Otherwise the pertinent positives of the ROS are noted above.   Objective:   BP 120/84   Pulse 67   Temp 97.7 F (36.5 C) (Oral)   Ht 5\' 6"  (1.676 m)   Wt 197 lb 8 oz (89.6 kg)   BMI 31.88 kg/m    GEN: Well-developed,well-nourished,in no acute distress; alert,appropriate and cooperative throughout examination HEENT: Normocephalic and atraumatic without obvious abnormalities. Ears, externally no deformities PULM: Breathing comfortably in no respiratory distress EXT: No clubbing, cyanosis, or edema PSYCH: Normally interactive. Cooperative during the interview. Pleasant. Friendly and conversant. Not anxious or depressed appearing. Normal, full affect.  Foot: R Echymosis: no Edema: no ROM: full LE B Gait: heel toe, non-antalgic MT pain: no Callus pattern: none Lateral Mall: NT Medial Mall: NT Talus: NT Navicular: NT Cuboid: NT Calcaneous: NT Metatarsals: NT 5th MT: NT Phalanges: NT Achilles: PAINFUL TO PALPATE AT INSERTION ON R, SMALL NODULE Plantar Fascia: NT Fat Pad: NT Peroneals: ttp on r Post Tib: NT Great Toe: Nml motion Ant Drawer: neg ATFL: NT CFL: NT Deltoid: NT Sensation: intact   Knee:  R Gait: Normal  heel toe pattern ROM: 0-120 Effusion: neg Echymosis or edema: none Patellar tendon NT Painful PLICA: neg Patellar grind: negative Medial and lateral patellar facet loading: negative medial and lateral joint lines: medial > lateral pain Mcmurray's neg Flexion-pinch pain Varus and valgus stress: stable Lachman: neg Ant and Post drawer: neg Hip abduction, IR, ER: WNL Hip flexion str: 5/5 Hip abd: 5/5 Quad: 5/5 VMO atrophy:No Hamstring concentric and eccentric: 5/5   Radiology: No results found.  Assessment and Plan:   Primary localized osteoarthritis of right knee  Right knee pain, unspecified chronicity - Plan: methylPREDNISolone acetate (DEPO-MEDROL) injection 80 mg  Achilles tendinitis, right leg  Peroneal  tendinosis, right  She is making some good progress with her Achilles tendinopathy as well as her peroneal tendinopathy, and she is been diligent with her rehab.  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  I think is reasonable to do a corticosteroid injection for her knee, and she is had good response in the past.  Knee Injection, R Patient verbally consented to procedure. Risks (including potential rare risk of infection), benefits, and alternatives explained. Sterilely prepped with Chloraprep. Ethyl cholride used for anesthesia. 8 cc Lidocaine 1% mixed with 2 mL Depo-Medrol 40 mg injected using the anteromedial approach without difficulty. No complications with procedure and tolerated well. Patient had decreased pain post-injection.   Follow-up: if needed this summer  Meds ordered this encounter  Medications  . meloxicam (MOBIC) 15 MG tablet    Sig: Take 1 tablet (15 mg total) by mouth daily as needed for pain.    Dispense:  30 tablet    Refill:  1  . methylPREDNISolone acetate (DEPO-MEDROL) injection 80 mg   Signed,  Lauran Romanski T. Ilee Randleman,  MD   Allergies as of 07/03/2017      Reactions   Bupropion Hcl    REACTION: itching      Medication List        Accurate as of 07/03/17 11:59 PM. Always use your most recent med list.          ALEVE PM PO Take 1 tablet by mouth daily as needed. Reported on 07/14/2015   amoxicillin 500 MG capsule Commonly known as:  AMOXIL TAKE 4 CAPSULES BY MOUTH 1 HOUR BEFORE DENTAL APPOINTMENT   levothyroxine 88 MCG tablet Commonly known as:  SYNTHROID, LEVOTHROID TAKE 1 TABLET BY MOUTH DAILY   meloxicam 15 MG tablet Commonly known as:  MOBIC Take 15 mg by mouth daily as needed for pain.   meloxicam 15 MG tablet Commonly known as:  MOBIC Take 1 tablet (15 mg total) by mouth daily as needed for pain.   MULTIPLE VITAMINS PO Take by mouth daily. Reported on 06/14/2015   sertraline 100 MG tablet Commonly known as:  ZOLOFT Take 100 mg by mouth daily as needed.

## 2017-07-10 ENCOUNTER — Other Ambulatory Visit: Payer: Self-pay | Admitting: *Deleted

## 2017-07-10 MED ORDER — LEVOTHYROXINE SODIUM 88 MCG PO TABS: 88.0000 ug | ORAL_TABLET | Freq: Every day | ORAL | 0 refills | Status: DC

## 2017-07-12 ENCOUNTER — Other Ambulatory Visit: Payer: Self-pay | Admitting: *Deleted

## 2017-07-12 MED ORDER — MELOXICAM 15 MG PO TABS: 15.0000 mg | ORAL_TABLET | Freq: Every day | ORAL | 1 refills | Status: DC | PRN

## 2017-07-12 NOTE — Telephone Encounter (Signed)
Last office visit 07/03/2017.  Last refilled 07/03/2017 to local pharmacy.  Now being requested by [facility].  Ok to refill?

## 2017-07-15 ENCOUNTER — Encounter: Payer: Self-pay | Admitting: Family Medicine

## 2017-08-06 ENCOUNTER — Ambulatory Visit: Payer: 59 | Admitting: Internal Medicine

## 2017-08-06 DIAGNOSIS — H9011 Conductive hearing loss, unilateral, right ear, with unrestricted hearing on the contralateral side: Secondary | ICD-10-CM | POA: Diagnosis not present

## 2017-08-06 DIAGNOSIS — J343 Hypertrophy of nasal turbinates: Secondary | ICD-10-CM | POA: Diagnosis not present

## 2017-08-06 DIAGNOSIS — H6121 Impacted cerumen, right ear: Secondary | ICD-10-CM | POA: Diagnosis not present

## 2017-09-02 ENCOUNTER — Other Ambulatory Visit: Payer: Self-pay | Admitting: Family Medicine

## 2017-10-23 ENCOUNTER — Telehealth: Payer: Self-pay | Admitting: Family Medicine

## 2017-10-23 NOTE — Telephone Encounter (Signed)
Copied from [place] [id number]. Topic: Quick Communication - See Telephone Encounter >> Oct 23, 2017  8:48 AM Sheran Luz wrote: CRM for notification. See Telephone encounter for: 10/23/17.   Pt is requesting orders for lab to check thyroid. Pt declined an appointment with PCP. Pt would like a call back to discuss and schedule if possible.

## 2017-10-23 NOTE — Telephone Encounter (Signed)
I spoke with pt; pt has been feeling more tired and pt has gained weight and losing hair when brushing hair or showering. Pt last seen 10/24/16 with TSH.Pt did schedule appt to see Dr Glori Bickers on 10/28/17 at 9 AM.  Due to work pt cannot come in prior to appt to have labs drawn; pt wants labs drawn at 10/28/17 appt. FYI to Dr Glori Bickers.

## 2017-10-28 ENCOUNTER — Ambulatory Visit: Payer: 59 | Admitting: Family Medicine

## 2017-10-28 ENCOUNTER — Encounter: Payer: Self-pay | Admitting: Family Medicine

## 2017-10-28 VITALS — BP 108/76 | HR 57 | Temp 98.1°F | Ht 66.0 in | Wt 193.5 lb

## 2017-10-28 DIAGNOSIS — R5382 Chronic fatigue, unspecified: Secondary | ICD-10-CM

## 2017-10-28 DIAGNOSIS — Z23 Encounter for immunization: Secondary | ICD-10-CM | POA: Diagnosis not present

## 2017-10-28 DIAGNOSIS — R739 Hyperglycemia, unspecified: Secondary | ICD-10-CM

## 2017-10-28 DIAGNOSIS — G8929 Other chronic pain: Secondary | ICD-10-CM

## 2017-10-28 DIAGNOSIS — M25511 Pain in right shoulder: Secondary | ICD-10-CM

## 2017-10-28 DIAGNOSIS — E039 Hypothyroidism, unspecified: Secondary | ICD-10-CM | POA: Diagnosis not present

## 2017-10-28 LAB — COMPREHENSIVE METABOLIC PANEL
ALT: 9 U/L (ref 0–35)
AST: 11 U/L (ref 0–37)
Albumin: 4.3 g/dL (ref 3.5–5.2)
Alkaline Phosphatase: 61 U/L (ref 39–117)
BUN: 15 mg/dL (ref 6–23)
CHLORIDE: 104 meq/L (ref 96–112)
CO2: 25 meq/L (ref 19–32)
Calcium: 9.5 mg/dL (ref 8.4–10.5)
Creatinine, Ser: 0.81 mg/dL (ref 0.40–1.20)
GFR: 78.73 mL/min (ref >60.00)
GLUCOSE: 100 mg/dL — AB (ref 70–99)
POTASSIUM: 4.2 meq/L (ref 3.5–5.1)
SODIUM: 135 meq/L (ref 135–145)
Total Bilirubin: 0.8 mg/dL (ref 0.2–1.2)
Total Protein: 7.5 g/dL (ref 6.0–8.3)

## 2017-10-28 LAB — CBC WITH DIFFERENTIAL/PLATELET
BASOS PCT: 0.5 % (ref 0.0–3.0)
Basophils Absolute: 0 10*3/uL (ref 0.0–0.1)
EOS PCT: 1.2 % (ref 0.0–5.0)
Eosinophils Absolute: 0.1 10*3/uL (ref 0.0–0.7)
HCT: 36.7 % (ref 36.0–46.0)
Hemoglobin: 11.8 g/dL — ABNORMAL LOW (ref 12.0–15.0)
LYMPHS ABS: 1.6 10*3/uL (ref 0.7–4.0)
Lymphocytes Relative: 17.7 % (ref 12.0–46.0)
MCHC: 32.1 g/dL (ref 30.0–36.0)
Monocytes Absolute: 0.5 10*3/uL (ref 0.1–1.0)
Monocytes Relative: 5.3 % (ref 3.0–12.0)
NEUTROS ABS: 6.9 10*3/uL (ref 1.4–7.7)
Neutrophils Relative %: 75.3 % (ref 43.0–77.0)
PLATELETS: 228 10*3/uL (ref 150.0–400.0)
RBC: 5.65 Mil/uL — AB (ref 3.87–5.11)
RDW: 14.7 % (ref 11.5–15.5)
WBC: 9.2 10*3/uL (ref 4.0–10.5)

## 2017-10-28 LAB — TSH: TSH: 6.07 u[IU]/mL — AB (ref 0.35–4.50)

## 2017-10-28 LAB — HEMOGLOBIN A1C: HEMOGLOBIN A1C: 5.7 % (ref 4.6–6.5)

## 2017-10-28 MED ORDER — AMOXICILLIN 500 MG PO CAPS: ORAL_CAPSULE | ORAL | 3 refills | Status: DC

## 2017-10-28 MED ORDER — MELOXICAM 15 MG PO TABS: 15.0000 mg | ORAL_TABLET | Freq: Every day | ORAL | 0 refills | Status: DC | PRN

## 2017-10-28 NOTE — Progress Notes (Signed)
Subjective:    Patient ID: Janet Douglas, female    DOB: [birth year], 52 y.o.   MRN: [id number]  HPI Here for f/u of chronic health problems   Due for labs- annual   Noticed more hair loss recently  Also more fatigued  (sleep is not good- shoulder keeps her up) Takes 88 mcg of levothy Lab Results  Component Value Date   TSH 4.25 10/24/2016    Due for re check   Last mammogram 3/19 Last gyn exam 3/19   Wants flu shot today  Also needs Tdap   Needs refill of amox for dental visit (proph for her cerebral shunt)  Wt Readings from Last 3 Encounters:  10/28/17 193 lb 8 oz (87.8 kg)  07/03/17 197 lb 8 oz (89.6 kg)  05/20/17 196 lb 8 oz (89.1 kg)  wt is down 4 lb  31.23 kg/m   R shoulder pain -worse at night to roll over in bed  Working makes it worse  Pain to abduct-pain radiates to her bicep area  No swelling  Uses ice that is helpful  Takes meloxicam (for other problems also)  Thinks she has arthritis -saw Dr Janet Douglas -needs ref to return  BP BP Readings from Last 3 Encounters:  10/28/17 108/76  07/03/17 120/84  05/20/17 120/76   Hyperglycemia Lab Results  Component Value Date   HGBA1C 5.8 10/24/2016   In general- diet has been bad in generally  Plans to go on a keto program (hard to do with her husband)    Patient Active Problem List   Diagnosis Date Noted  . Peroneal tendinitis 05/20/2017  . Drooping eyelid, bilateral 12/25/2016  . Bloating 10/24/2016  . Colon cancer screening 10/24/2016  . Allergic eye reaction 02/17/2016  . Vision changes 08/31/2015  . Right shoulder pain 08/31/2015  . Perimenopausal 08/31/2015  . Nevus 08/31/2015  . Hyperglycemia 08/31/2014  . Fatigue 08/31/2014  . Perimenopause 08/31/2014  . Screening for lipoid disorders 08/31/2014  . Cough 01/14/2013  . History of brain tumor 11/28/2012  . Skin lesion of face 04/14/2012  . Obesity 06/05/2011  . Allergic rhinitis 05/18/2010  . DEPRESSION, SITUATIONAL 06/15/2009  .  ELEVATED BLOOD PRESSURE WITHOUT DIAGNOSIS OF HYPERTENSION 06/15/2009  . PLANTAR FASCIITIS 05/20/2008  . METATARSALGIA 05/06/2008  . OTHER ENTHESOPATHY OF ANKLE AND TARSUS 05/06/2008  . SESAMOIDITIS 11/04/2007  . NONORGANIC SLEEP DISORDER UNSPECIFIED 04/24/2007  . POOR CONCENTRATION 09/10/2006  . Hypothyroidism 09/09/2006  . Anemia 09/09/2006  . TRANSAMINASES, SERUM, ELEVATED 09/09/2006  . ABNORMAL PAP SMEAR 09/09/2006   Past Medical History:  Diagnosis Date  . Anemia    NOS  . Brain tumor ([place])    S/P resection with shunt  . Hypothyroidism    Past Surgical History:  Procedure Laterality Date  . Abdominal US  05/19/1999   Negative  . Abdominal US  08/10   Normal  . 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 TUBAL LIGATION     Social History   Tobacco Use  . Smoking status: Never Smoker  . Smokeless tobacco: Never Used  Substance Use Topics  . Alcohol use: Yes    Alcohol/week: 0.0 standard drinks    Comment: Minimal  . Drug use: No   Family History  Problem Relation Age of Onset  . Thyroid disease Mother   . Thyroid disease Sister   . Heart disease Sister        Low heart rate  . Thyroid disease Brother   .  Thyroid disease Brother   . Colon cancer Neg Hx    Allergies  Allergen Reactions  . Bupropion Hcl     REACTION: itching   Current Outpatient Medications on File Prior to Visit  Medication Sig Dispense Refill  . levothyroxine (SYNTHROID, LEVOTHROID) 88 MCG tablet TAKE 1 TABLET BY MOUTH  DAILY 90 tablet 0  . MULTIPLE VITAMINS PO Take by mouth daily. Reported on 06/14/2015    . Naproxen Sod-Diphenhydramine (ALEVE PM PO) Take 1 tablet by mouth daily as needed. Reported on 07/14/2015    . sertraline (ZOLOFT) 100 MG tablet Take 100 mg by mouth daily as needed.     No current facility-administered medications on file prior to visit.     Review of Systems  Constitutional: Positive for fatigue. Negative for activity change, appetite change, fever and  unexpected weight change.  HENT: Negative for congestion, ear pain, rhinorrhea, sinus pressure and sore throat.   Eyes: Negative for pain, redness and visual disturbance.  Respiratory: Negative for cough, shortness of breath and wheezing.   Cardiovascular: Negative for chest pain and palpitations.  Gastrointestinal: Negative for abdominal pain, blood in stool, constipation and diarrhea.  Endocrine: Negative for polydipsia and polyuria.  Genitourinary: Negative for dysuria, frequency and urgency.  Musculoskeletal: Positive for arthralgias. Negative for back pain and myalgias.       Right shoulder pain  Skin: Negative for pallor and rash.       Hair loss when brushing (not focal)   Allergic/Immunologic: Negative for environmental allergies.  Neurological: Negative for dizziness, syncope and headaches.  Hematological: Negative for adenopathy. Does not bruise/bleed easily.  Psychiatric/Behavioral: Negative for decreased concentration and dysphoric mood. The patient is not nervous/anxious.        Objective:   Physical Exam  Constitutional: She appears well-developed and well-nourished. No distress.  obese and well appearing   HENT:  Head: Normocephalic and atraumatic.  Mouth/Throat: Oropharynx is clear and moist.  Eyes: Pupils are equal, round, and reactive to light. Conjunctivae and EOM are normal.  Neck: Normal range of motion. Neck supple. No thyromegaly present.  Cardiovascular: Normal rate, regular rhythm and normal heart sounds.  Pulmonary/Chest: Effort normal and breath sounds normal.  Musculoskeletal: She exhibits tenderness. She exhibits no edema or deformity.  Limited rom of R shoulder (abduction)  No crepitus  Some anterior shoulder tenderness  Lymphadenopathy:    She has no cervical adenopathy.  Neurological: She is alert. She displays normal reflexes. No cranial nerve deficit. Coordination normal.  Skin: Skin is warm and dry. No rash noted. No pallor.  No focal alopecia    Psychiatric: She has a normal mood and affect.          Assessment & Plan:

## 2017-10-28 NOTE — Assessment & Plan Note (Signed)
Pt takes 88 mcg of levothy- takes it appropriately and no missed doses  Fatigue and increased hair loss (not focal)  TSH today and adj levothy dose if needed

## 2017-10-28 NOTE — Patient Instructions (Addendum)
Try to get most of your carbohydrates from produce (with the exception of white potatoes)  Eat less bread/pasta/rice/snack foods/cereals/sweets and other items from the middle of the grocery store (processed carbs) (try to stick with lean protein)   Take care of yourself   We will refer you to orthopedics for the shoulder

## 2017-10-28 NOTE — Assessment & Plan Note (Signed)
Per pt- diet has been bad and no exercise due to shoulder pain (though her job is very physically demanding)  A1C today  Disc low glycemic diet  She likes keto- recommend she does eat fruit/veg and avoid very fatty proteins

## 2017-10-28 NOTE — Assessment & Plan Note (Signed)
Ongoing- with limited abduction on rom Per pt- diag with OA in the past Needs a f/u appt with Dr Noemi Chapel - ref to ortho today  Recommend ice Also takes meloxicam

## 2017-10-28 NOTE — Assessment & Plan Note (Signed)
More lately (also hair loss)  Not sleeping as well due to shoulder pain  Also ? If thyroid is off  Lab incl tsh today

## 2017-10-29 ENCOUNTER — Telehealth: Payer: Self-pay | Admitting: *Deleted

## 2017-10-29 MED ORDER — LEVOTHYROXINE SODIUM 100 MCG PO TABS: 100.0000 ug | ORAL_TABLET | Freq: Every day | ORAL | 3 refills | Status: DC

## 2017-10-29 NOTE — Telephone Encounter (Signed)
Rx sent to pharmacy   

## 2017-10-29 NOTE — Telephone Encounter (Signed)
-----   Message from Abner Greenspan, MD sent at 10/28/2017  9:04 PM EDT ----- TSH is high  She is on 88 mcg levothy-please verify that she takes it daily separate from food or other medicines I want to inc to 100 mcg Please send in 100 mcg levothyroxine 1 po qd #30 3 ref  Re check TSH in 6 weeks please  Other labs are stable

## 2017-11-07 DIAGNOSIS — M1812 Unilateral primary osteoarthritis of first carpometacarpal joint, left hand: Secondary | ICD-10-CM | POA: Diagnosis not present

## 2017-11-07 DIAGNOSIS — M7581 Other shoulder lesions, right shoulder: Secondary | ICD-10-CM | POA: Diagnosis not present

## 2017-11-12 ENCOUNTER — Other Ambulatory Visit: Payer: Self-pay | Admitting: Family Medicine

## 2017-12-10 ENCOUNTER — Other Ambulatory Visit (INDEPENDENT_AMBULATORY_CARE_PROVIDER_SITE_OTHER): Payer: 59

## 2017-12-10 ENCOUNTER — Telehealth: Payer: Self-pay

## 2017-12-10 DIAGNOSIS — E039 Hypothyroidism, unspecified: Secondary | ICD-10-CM | POA: Diagnosis not present

## 2017-12-10 DIAGNOSIS — Z23 Encounter for immunization: Secondary | ICD-10-CM | POA: Diagnosis not present

## 2017-12-10 LAB — TSH: TSH: 5.44 u[IU]/mL — ABNORMAL HIGH (ref 0.35–4.50)

## 2017-12-10 NOTE — Telephone Encounter (Signed)
Aware- f/u when able as advised

## 2017-12-10 NOTE — Telephone Encounter (Signed)
Janet Douglas was in office to get lab testing done, pt wanted to get BP check; pt having h/a and some vision changes but pt said when a weather front comes in can cause H/A as well due to pt having a shunt. Offered pt multiple appts but pt said she was busy today and when front goes thru if still H/A will cb for appt. Advised pt to cb for appt or if at night go to [facility]. pt voiced understanding. FYI to DR [employer].

## 2017-12-11 ENCOUNTER — Telehealth: Payer: Self-pay | Admitting: *Deleted

## 2017-12-11 MED ORDER — LEVOTHYROXINE SODIUM 125 MCG PO TABS: 125.0000 ug | ORAL_TABLET | Freq: Every day | ORAL | 5 refills | Status: DC

## 2017-12-11 NOTE — Telephone Encounter (Signed)
Rx sent and lab appt scheduled

## 2017-12-11 NOTE — Telephone Encounter (Signed)
-----   Message from Abner Greenspan, MD sent at 12/10/2017  5:46 PM EDT ----- Rel on mychart  Need to inc thyroid dose from 100 to 125 Please send in levothyroxine 125 mcg 1 po qd #30 5 ref to pharmacy of choice  Re check TSH in 6 weeks Thanks

## 2017-12-18 DIAGNOSIS — B373 Candidiasis of vulva and vagina: Secondary | ICD-10-CM | POA: Diagnosis not present

## 2018-01-20 ENCOUNTER — Telehealth: Payer: Self-pay | Admitting: Family Medicine

## 2018-01-20 DIAGNOSIS — E039 Hypothyroidism, unspecified: Secondary | ICD-10-CM

## 2018-01-20 NOTE — Telephone Encounter (Signed)
-----   Message from Lendon Collar, RT sent at 01/13/2018 12:20 PM EST ----- Regarding: Lab orders for Tuesday 01/21/18 Please enter lab orders for 01/21/18. Schedule states that she is coming in for a TSH. Thanks!

## 2018-01-21 ENCOUNTER — Other Ambulatory Visit (INDEPENDENT_AMBULATORY_CARE_PROVIDER_SITE_OTHER): Payer: 59

## 2018-01-21 ENCOUNTER — Other Ambulatory Visit: Payer: Self-pay | Admitting: Family Medicine

## 2018-01-21 DIAGNOSIS — E039 Hypothyroidism, unspecified: Secondary | ICD-10-CM

## 2018-01-21 LAB — TSH: TSH: 0.57 u[IU]/mL (ref 0.35–4.50)

## 2018-01-22 NOTE — Telephone Encounter (Signed)
Last office visit 10/28/2017 for chronic fatigue.  Last refilled 10/28/2017 for #90 with no refills.  No future appointments.  Ok to refill?

## 2018-03-05 ENCOUNTER — Other Ambulatory Visit: Payer: Self-pay | Admitting: *Deleted

## 2018-03-05 MED ORDER — LEVOTHYROXINE SODIUM 125 MCG PO TABS: 125.0000 ug | ORAL_TABLET | Freq: Every day | ORAL | 1 refills | Status: DC

## 2018-03-12 DIAGNOSIS — Z23 Encounter for immunization: Secondary | ICD-10-CM | POA: Diagnosis not present

## 2018-03-20 ENCOUNTER — Ambulatory Visit: Payer: 59 | Admitting: Family Medicine

## 2018-03-20 ENCOUNTER — Encounter: Payer: Self-pay | Admitting: Family Medicine

## 2018-03-20 VITALS — BP 132/78 | HR 76 | Temp 98.3°F | Ht 66.0 in | Wt 199.8 lb

## 2018-03-20 DIAGNOSIS — M542 Cervicalgia: Secondary | ICD-10-CM | POA: Diagnosis not present

## 2018-03-20 DIAGNOSIS — Z982 Presence of cerebrospinal fluid drainage device: Secondary | ICD-10-CM | POA: Diagnosis not present

## 2018-03-20 DIAGNOSIS — Z87898 Personal history of other specified conditions: Secondary | ICD-10-CM | POA: Diagnosis not present

## 2018-03-20 NOTE — Patient Instructions (Signed)
Cervical Strain and Sprain Rehab Ask your health care provider which exercises are safe for you. Do exercises exactly as told by your health care provider and adjust them as directed. It is normal to feel mild stretching, pulling, tightness, or discomfort as you do these exercises, but you should stop right away if you feel sudden pain or your pain gets worse.Do not begin these exercises until told by your health care provider. Stretching and range of motion exercises These exercises warm up your muscles and joints and improve the movement and flexibility of your neck. These exercises also help to relieve pain, numbness, and tingling. Exercise A: Cervical side bend  1. Using good posture, sit on a stable chair or stand up. 2. Without moving your shoulders, slowly tilt your left / right ear to your shoulder until you feel a stretch in your neck muscles. You should be looking straight ahead. 3. Hold for __________ seconds. 4. Repeat with the other side of your neck. Repeat __________ times. Complete this exercise __________ times a day. Exercise B: Cervical rotation  1. Using good posture, sit on a stable chair or stand up. 2. Slowly turn your head to the side as if you are looking over your left / right shoulder. ? Keep your eyes level with the ground. ? Stop when you feel a stretch along the side and the back of your neck. 3. Hold for __________ seconds. 4. Repeat this by turning to your other side. Repeat __________ times. Complete this exercise __________ times a day. Exercise C: Thoracic extension and pectoral stretch 1. Roll a towel or a small blanket so it is about 4 inches (10 cm) in diameter. 2. Lie down on your back on a firm surface. 3. Put the towel lengthwise, under your spine in the middle of your back. It should not be not under your shoulder blades. The towel should line up with your spine from your middle back to your lower back. 4. Put your hands behind your head and let your  elbows fall out to your sides. 5. Hold for __________ seconds. Repeat __________ times. Complete this exercise __________ times a day. Strengthening exercises These exercises build strength and endurance in your neck. Endurance is the ability to use your muscles for a long time, even after your muscles get tired. Exercise D: Upper cervical flexion, isometric 1. Lie on your back with a thin pillow behind your head and a small rolled-up towel under your neck. 2. Gently tuck your chin toward your chest and nod your head down to look toward your feet. Do not lift your head off the pillow. 3. Hold for __________ seconds. 4. Release the tension slowly. Relax your neck muscles completely before you repeat this exercise. Repeat __________ times. Complete this exercise __________ times a day. Exercise E: Cervical extension, isometric  1. Stand about 6 inches (15 cm) away from a wall, with your back facing the wall. 2. Place a soft object, about 6-8 inches (15-20 cm) in diameter, between the back of your head and the wall. A soft object could be a small pillow, a ball, or a folded towel. 3. Gently tilt your head back and press into the soft object. Keep your jaw and forehead relaxed. 4. Hold for __________ seconds. 5. Release the tension slowly. Relax your neck muscles completely before you repeat this exercise. Repeat __________ times. Complete this exercise __________ times a day. Posture and body mechanics Body mechanics refers to the movements and positions of your   body while you do your daily activities. Posture is part of body mechanics. Good posture and healthy body mechanics can help to relieve stress in your body's tissues and joints. Good posture means that your spine is in its natural S-curve position (your spine is neutral), your shoulders are pulled back slightly, and your head is not tipped forward. The following are general guidelines for applying improved posture and body mechanics to your  everyday activities. Standing   When standing, keep your spine neutral and keep your feet about hip-width apart. Keep a slight bend in your knees. Your ears, shoulders, and hips should line up.  When you do a task in which you stand in one place for a long time, place one foot up on a stable object that is 2-4 inches (5-10 cm) high, such as a footstool. This helps keep your spine neutral. Sitting   When sitting, keep your spine neutral and your keep feet flat on the floor. Use a footrest, if necessary, and keep your thighs parallel to the floor. Avoid rounding your shoulders, and avoid tilting your head forward.  When working at a desk or a computer, keep your desk at a height where your hands are slightly lower than your elbows. Slide your chair under your desk so you are close enough to maintain good posture.  When working at a computer, place your monitor at a height where you are looking straight ahead and you do not have to tilt your head forward or downward to look at the screen. Resting When lying down and resting, avoid positions that are most painful for you. Try to support your neck in a neutral position. You can use a contour pillow or a small rolled-up towel. Your pillow should support your neck but not push on it. This information is not intended to replace advice given to you by your health care provider. Make sure you discuss any questions you have with your health care provider. Document Released: 02/05/2005 Document Revised: 10/13/2015 Document Reviewed: 01/12/2015 [employer]vier Interactive Patient Education  2019 [employer]  

## 2018-03-20 NOTE — Progress Notes (Signed)
Subjective:     Janet Douglas is a 53 y.o. female presenting for Neck Pain (right behing right ear. Sharp and pulsating at times pain. Flared up in the past 2 days. History of brain tumor in 2006 and it was removed. No known specific injury or trauma to the area to make it hurt now. Patient did take Meloxicam 15 mg yesterday and that did help the pain)     Neck Pain   This is a new problem. The current episode started in the past 7 days. The problem occurs intermittently. The problem has been waxing and waning. The pain is associated with an unknown factor. The pain is present in the right side. The quality of the pain is described as stabbing. The pain is severe. Nothing aggravates the symptoms. The pain is worse during the night. Pertinent negatives include no chest pain, fever, headaches, numbness, pain with swallowing, photophobia, tingling, visual change or weakness. She has tried NSAIDs, heat and ice for the symptoms. The treatment provided moderate relief.   Has never had issues with the Shunt since its placement   Review of Systems  Constitutional: Negative for chills and fever.  Eyes: Negative for photophobia.  Cardiovascular: Negative for chest pain.  Musculoskeletal: Positive for neck pain.  Neurological: Negative for tingling, weakness, numbness and headaches.  Psychiatric/Behavioral: Negative for confusion.     Social History   Tobacco Use  Smoking Status Never Smoker  Smokeless Tobacco Never Used        Objective:    BP Readings from Last 3 Encounters:  03/20/18 132/78  10/28/17 108/76  07/03/17 120/84   Wt Readings from Last 3 Encounters:  03/20/18 199 lb 12 oz (90.6 kg)  10/28/17 193 lb 8 oz (87.8 kg)  07/03/17 197 lb 8 oz (89.6 kg)    BP 132/78   Pulse 76   Temp 98.3 F (36.8 C)   Ht 5\' 6"  (1.676 m)   Wt 199 lb 12 oz (90.6 kg)   SpO2 96%   BMI 32.24 kg/m    Physical Exam Constitutional:      General: She is not in acute distress.  Appearance: She is well-developed. She is not diaphoretic.  HENT:     Right Ear: External ear normal.     Left Ear: External ear normal.     Nose: Nose normal.  Eyes:     Conjunctiva/sclera: Conjunctivae normal.  Neck:     Musculoskeletal: Full passive range of motion without pain, normal range of motion and neck supple. Muscular tenderness (on the right side) present. No edema, erythema, neck rigidity or spinous process tenderness.     Comments: Increased muscle tension behind the right ear on the base of the skull and top of the neck. Able to palpate shunt just proximal to tense muscle Cardiovascular:     Rate and Rhythm: Normal rate.  Pulmonary:     Effort: Pulmonary effort is normal.  Skin:    General: Skin is warm and dry.     Capillary Refill: Capillary refill takes less than 2 seconds.  Neurological:     Mental Status: She is alert and oriented to person, place, and time. Mental status is at baseline.     Cranial Nerves: Cranial nerves are intact.     Sensory: Sensation is intact.  Psychiatric:        Mood and Affect: Mood normal.        Behavior: Behavior normal.  Assessment & Plan:   Problem List Items Addressed This Visit      Nervous and Auditory   History of brain tumor    Other Visit Diagnoses    Neck pain    -  Primary   Ventricular shunt in place          Pt with hx of brain tumor and shunt placement now with neck pain adjacent to shunt location. No signs of failure with normal neuro exam and no systemic issues. Exam with muscle tension but given location reached out to Neurosurgeon Dr. Christella Noa for advice regarding imaging does not feel imaging is necessary. Very low suspicion for shunt involvement in neck pain. Has no need for follow-up with Neurosurgery at this time.   Called patient to relay conversation with neurology. Advised exercise and NSAIDs and return if not improving.   Return if symptoms worsen or fail to improve.  Lesleigh Noe,  MD

## 2018-05-01 ENCOUNTER — Emergency Department (HOSPITAL_COMMUNITY): Payer: 59

## 2018-05-01 ENCOUNTER — Encounter (HOSPITAL_COMMUNITY): Payer: Self-pay | Admitting: Emergency Medicine

## 2018-05-01 ENCOUNTER — Emergency Department (HOSPITAL_COMMUNITY)
Admission: EM | Admit: 2018-05-01 | Discharge: 2018-05-01 | Disposition: A | Discharge status: Home / Self Care | Payer: 59 | Attending: Emergency Medicine | Admitting: Emergency Medicine

## 2018-05-01 ENCOUNTER — Ambulatory Visit: Payer: Self-pay | Admitting: *Deleted

## 2018-05-01 ENCOUNTER — Other Ambulatory Visit: Payer: Self-pay

## 2018-05-01 DIAGNOSIS — R51 Headache: Secondary | ICD-10-CM | POA: Diagnosis not present

## 2018-05-01 DIAGNOSIS — Z79899 Other long term (current) drug therapy: Secondary | ICD-10-CM | POA: Insufficient documentation

## 2018-05-01 DIAGNOSIS — R519 Headache, unspecified: Secondary | ICD-10-CM

## 2018-05-01 DIAGNOSIS — E039 Hypothyroidism, unspecified: Secondary | ICD-10-CM | POA: Insufficient documentation

## 2018-05-01 DIAGNOSIS — Z982 Presence of cerebrospinal fluid drainage device: Secondary | ICD-10-CM | POA: Diagnosis not present

## 2018-05-01 LAB — CBC
HEMATOCRIT: 38.2 % (ref 36.0–46.0)
HEMOGLOBIN: 11.7 g/dL — AB (ref 12.0–15.0)
MCH: 19.9 pg — ABNORMAL LOW (ref 26.0–34.0)
MCHC: 30.6 g/dL (ref 30.0–36.0)
MCV: 65 fL — ABNORMAL LOW (ref 80.0–100.0)
NRBC: 0 % (ref 0.0–0.2)
Platelets: 215 10*3/uL (ref 150–400)
RBC: 5.88 MIL/uL — ABNORMAL HIGH (ref 3.87–5.11)
RDW: 13.9 % (ref 11.5–15.5)
WBC: 8 10*3/uL (ref 4.0–10.5)

## 2018-05-01 LAB — BASIC METABOLIC PANEL
ANION GAP: 7 (ref 5–15)
BUN: 11 mg/dL (ref 6–20)
CO2: 24 mmol/L (ref 22–32)
Calcium: 9.4 mg/dL (ref 8.9–10.3)
Chloride: 108 mmol/L (ref 98–111)
Creatinine, Ser: 0.75 mg/dL (ref 0.44–1.00)
GFR calc Af Amer: >60 mL/min (ref >60)
GLUCOSE: 114 mg/dL — AB (ref 70–99)
POTASSIUM: 3.6 mmol/L (ref 3.5–5.1)
Sodium: 139 mmol/L (ref 135–145)

## 2018-05-01 MED ORDER — SODIUM CHLORIDE 0.9 % IV SOLN
INTRAVENOUS | Status: DC
  Administered 2018-05-01: 17:00:00 via INTRAVENOUS

## 2018-05-01 MED ORDER — KETOROLAC TROMETHAMINE 15 MG/ML IJ SOLN
15.0000 mg | Freq: Once | INTRAMUSCULAR | Status: AC
  Administered 2018-05-01: 15 mg via INTRAVENOUS
  Filled 2018-05-01: qty 1

## 2018-05-01 NOTE — ED Notes (Signed)
Patient is resting comfortably. 

## 2018-05-01 NOTE — Telephone Encounter (Signed)
Pt reports severe headache x 2 days. H/O  Glioblastoma and shunt placement. States shunt site tender to touch "I can't even touch the area or my hair there." Reports "Sharp pains shooting from shunt site to ear only when laying down." States "Up all night last night." Also states "This was the same pain I felt when I had my brain tumor." Denies nausea, dizziness, weakness, speech clear during call. States she called Dr. Hewitt Shorts practice who suggested she call PCP for appt and order for MRI. States "He was in surgery and could not order." [place] unable to reach practice. Pt directed to ER; states will follow disposition.  Reason for Disposition . [1] SEVERE headache (e.g., excruciating) AND [2] "worst headache" of life    Sharp shooting pains at shunt site when laying down; tender to touch "Can't even touch it."  Answer Assessment - Initial Assessment Questions 1. LOCATION: "Where does it hurt?"      At shunt, right side head 2. ONSET: "When did the headache start?" (Minutes, hours or days)      2 days ago 3. PATTERN: "Does the pain come and go, or has it been constant since it started?"     Constant , tender to touch; when lying down sharp shooting pains 4. SEVERITY: "How bad is the pain?" and "What does it keep you from doing?"  (e.g., Scale 1-10; mild, moderate, or severe)   - MILD (1-3): doesn't interfere with normal activities    - MODERATE (4-7): interferes with normal activities or awakens from sleep    - SEVERE (8-10): excruciating pain, unable to do any normal activities        Severe 5. RECURRENT SYMPTOM: "Have you ever had headaches before?" If so, ask: "When was the last time?" and "What happened that time?"     Yes, when had brain tumor 6. CAUSE: "What do you think is causing the headache?"     Shunt 7. MIGRAINE: "Have you been diagnosed with migraine headaches?" If so, ask: "Is this headache similar?"      no 8. HEAD INJURY: "Has there been any recent injury to the head?"      Has  shunt 9. OTHER SYMPTOMS: "Do you have any other symptoms?" (fever, stiff neck, eye pain, sore throat, cold symptoms)     no  Protocols used: HEADACHE-A-AH

## 2018-05-01 NOTE — ED Provider Notes (Signed)
Janet Douglas Note   CSN: [id number] Arrival date & time: 05/01/18  1118    History   Chief Complaint Chief Complaint  Patient presents with  . Pain from Shunt    HPI Janet Douglas is a 53 y.o. female.     Pt presents to the ED today with headache.  She has a hx of a brain tumor which she said was benign.  She had it resected with a VP shunt placed in 2006.  Dr. Christella Noa is her neurosurgeon.  She denies any n/v.  She denies any vision problems.  She took tylenol and mobic yesterday.     Past Medical History:  Diagnosis Date  . Anemia    NOS  . Brain tumor ([place])    S/P resection with shunt  . Hypothyroidism     Patient Active Problem List   Diagnosis Date Noted  . Peroneal tendinitis 05/20/2017  . Drooping eyelid, bilateral 12/25/2016  . Bloating 10/24/2016  . Colon cancer screening 10/24/2016  . Allergic eye reaction 02/17/2016  . Vision changes 08/31/2015  . Right shoulder pain 08/31/2015  . Perimenopausal 08/31/2015  . Nevus 08/31/2015  . Hyperglycemia 08/31/2014  . Fatigue 08/31/2014  . Perimenopause 08/31/2014  . Screening for lipoid disorders 08/31/2014  . Cough 01/14/2013  . History of brain tumor 11/28/2012  . Skin lesion of face 04/14/2012  . Obesity 06/05/2011  . Allergic rhinitis 05/18/2010  . DEPRESSION, SITUATIONAL 06/15/2009  . ELEVATED BLOOD PRESSURE WITHOUT DIAGNOSIS OF HYPERTENSION 06/15/2009  . PLANTAR FASCIITIS 05/20/2008  . METATARSALGIA 05/06/2008  . OTHER ENTHESOPATHY OF ANKLE AND TARSUS 05/06/2008  . SESAMOIDITIS 11/04/2007  . NONORGANIC SLEEP DISORDER UNSPECIFIED 04/24/2007  . POOR CONCENTRATION 09/10/2006  . Hypothyroidism 09/09/2006  . Anemia 09/09/2006  . TRANSAMINASES, SERUM, ELEVATED 09/09/2006  . ABNORMAL PAP SMEAR 09/09/2006    Past Surgical History:  Procedure Laterality Date  . Abdominal US  05/19/1999   Negative  . Abdominal US  08/10   Normal  . 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 TUBAL LIGATION       OB History   No obstetric history on file.      Home Medications    Prior to Admission medications   Medication Sig Start Date End Date Taking? Authorizing Douglas  acetaminophen (TYLENOL) 325 MG tablet Take 650 mg by mouth every 6 (six) hours as needed.    Douglas, Historical, MD  amoxicillin (AMOXIL) 500 MG capsule TAKE 4 CAPSULES BY MOUTH 1 HOUR BEFORE DENTAL APPOINTMENT Patient not taking: Reported on 03/20/2018 10/28/17   Douglas, Janet Fanny, MD  levothyroxine (SYNTHROID, LEVOTHROID) 125 MCG tablet Take 1 tablet (125 mcg total) by mouth daily. 03/05/18   Douglas, Janet Fanny, MD  meloxicam (MOBIC) 15 MG tablet Take 1 tablet (15 mg total) by mouth daily as needed for pain. 10/28/17   Douglas, Janet Fanny, MD  MULTIPLE VITAMINS PO Take by mouth daily. Reported on 06/14/2015    Douglas, Historical, MD  sertraline (ZOLOFT) 100 MG tablet Take 100 mg by mouth daily as needed.    Douglas, Historical, MD    Family History Family History  Problem Relation Age of Onset  . Thyroid disease Mother   . Thyroid disease Sister   . Heart disease Sister        Low heart rate  . Thyroid disease Brother   . Thyroid disease Brother   . Colon cancer Neg Hx  Social History Social History   Tobacco Use  . Smoking status: Never Smoker  . Smokeless tobacco: Never Used  Substance Use Topics  . Alcohol use: Yes    Alcohol/week: 0.0 standard drinks    Comment: Minimal  . Drug use: No     Allergies   Bupropion hcl   Review of Systems Review of Systems  Neurological: Positive for headaches.  All other systems reviewed and are negative.    Physical Exam Updated Vital Signs BP (!) 151/92   Pulse 66   Temp 98.1 F (36.7 C) (Oral)   Resp 18   Ht 5\' 6"  (1.676 m)   Wt 90.7 kg   LMP 02/25/2018 (Exact Date)   SpO2 100%   BMI 32.28 kg/m   Physical Exam Vitals signs and nursing note reviewed.  Constitutional:      Appearance: Normal  appearance.  HENT:     Head: Normocephalic and atraumatic.     Right Ear: External ear normal.     Left Ear: External ear normal.     Nose: Nose normal.     Mouth/Throat:     Mouth: Mucous membranes are moist.  Eyes:     Extraocular Movements: Extraocular movements intact.     Conjunctiva/sclera: Conjunctivae normal.     Pupils: Pupils are equal, round, and reactive to light.  Neck:     Musculoskeletal: Normal range of motion and neck supple.  Cardiovascular:     Rate and Rhythm: Normal rate and regular rhythm.     Pulses: Normal pulses.     Heart sounds: Normal heart sounds.  Pulmonary:     Effort: Pulmonary effort is normal.     Breath sounds: Normal breath sounds.  Abdominal:     General: Abdomen is flat.  Musculoskeletal: Normal range of motion.  Skin:    General: Skin is warm.     Capillary Refill: Capillary refill takes less than 2 seconds.  Neurological:     General: No focal deficit present.     Mental Status: She is alert and oriented to person, place, and time.  Psychiatric:        Mood and Affect: Mood normal.        Behavior: Behavior normal.      ED Treatments / Results  Labs (all labs ordered are listed, but only abnormal results are displayed) Labs Reviewed  CBC - Abnormal; Notable for the following components:      Result Value   RBC 5.88 (*)    Hemoglobin 11.7 (*)    MCV 65.0 (*)    MCH 19.9 (*)    All other components within normal limits  BASIC METABOLIC PANEL - Abnormal; Notable for the following components:   Glucose, Bld 114 (*)    All other components within normal limits    EKG None  Radiology Ct Head Wo Contrast  Result Date: 05/01/2018 CLINICAL DATA:  53 year old female with pain and tenderness at shunt site for 2-3 days. Severe cute headache. History of treated hemangioblastoma. EXAM: CT HEAD WITHOUT CONTRAST TECHNIQUE: Contiguous axial images were obtained from the base of the skull through the vertex without intravenous contrast.  COMPARISON:  Brain MRI 12/01/2012 and earlier. Head CT 11/30/2004. FINDINGS: Brain: Right superior approach ventriculostomy catheter appears stable in configuration since 2006 intersecting the right frontal horn. Asymmetrically decompressed right lateral ventricle is stable since the 2014 MRI. No ventriculomegaly. Chronic midline cerebellar encephalomalacia. Gray-white matter differentiation appears stable throughout the brain. No midline shift, mass  effect, evidence of mass lesion, extra-axial collection, intracranial hemorrhage or evidence of cortically based acute infarction. Vascular: Mild Calcified atherosclerosis at the skull base. No suspicious intracranial vascular hyperdensity. Skull: Previous suboccipital craniectomy. No acute osseous abnormality identified. Sinuses/Orbits: Mild mucosal thickening in the posterior right maxillary sinus but other visible paranasal sinuses, tympanic cavities, and mastoids are clear. Other: Chronic right anterior vertex approach shunt has been present since 2006. Subcutaneous reservoir and shunt tubing appears stable since that time with no adverse features identified. Negative orbits. No acute scalp soft tissue finding. IMPRESSION: 1. No acute intracranial abnormality. 2. Non contrast CT appearance of the brain appears stable since the 2014 MRI. Chronic right frontal approach shunt and posterior cerebellar encephalomalacia. Electronically Signed   By: Genevie Ann M.D.   On: 05/01/2018 17:40    Procedures Procedures (including critical care time)  Medications Ordered in ED Medications  0.9 %  sodium chloride infusion ( Intravenous New Bag/Given 05/01/18 1643)  ketorolac (TORADOL) 15 MG/ML injection 15 mg (15 mg Intravenous Given 05/01/18 1638)     Initial Impression / Assessment and Plan / ED Course  I have reviewed the triage vital signs and the nursing notes.  Pertinent labs & imaging results that were available during my care of the patient were reviewed by me  and considered in my medical decision making (see chart for details).     Pt is feeling much better.   Her CT scan shows no change from MRI in 2014.  VP shunt appears to be working.  Pt is stable for d/c.  Return if worse.  Final Clinical Impressions(s) / ED Diagnoses   Final diagnoses:  Acute nonintractable headache, unspecified headache type  S/P VP shunt    ED Discharge Orders    None       Isla Pence, MD 05/01/18 1752

## 2018-05-01 NOTE — ED Notes (Signed)
Pt reports tenderness around shunt past 2-3 days. Shunt is on the right side of the top of her head. Shooting pain reported at night resulting in lack of sleep. Shunt in place since 2006.

## 2018-05-01 NOTE — ED Notes (Signed)
Patient transported to CT 

## 2018-05-01 NOTE — Telephone Encounter (Signed)
She is in the ED now  Thanks

## 2018-05-01 NOTE — Telephone Encounter (Signed)
Elliot Cousin RN also noted.  Pt directed to ED; please see triage summary.   Routing comment    Per chart review tab pt is at [facility] ED.

## 2018-05-01 NOTE — ED Triage Notes (Signed)
Pt reports having a shunt in her head. Pt sharp pains in the area of the shunt.

## 2018-05-10 ENCOUNTER — Encounter: Payer: Self-pay | Admitting: Adult Health

## 2018-05-10 ENCOUNTER — Encounter: Payer: Self-pay | Admitting: Family Medicine

## 2018-05-12 MED ORDER — AMOXICILLIN-POT CLAVULANATE 875-125 MG PO TABS: 1.0000 | ORAL_TABLET | Freq: Two times a day (BID) | ORAL | 0 refills | Status: DC

## 2018-05-12 NOTE — Progress Notes (Addendum)
      Assessment & Plan:   Virtual Visit via Video Note  I connected with Janet Douglas on 05/14/18 at 11:15 AM EDT by a video enabled telemedicine application and verified that I am speaking with the correct person using two identifiers.   I discussed the limitations of evaluation and management by telemedicine and the availability of in person appointments. The patient expressed understanding and agreed to proceed.  History of Present Illness: Janet Douglas has called in today to establish as a new pt. PMH: Obesity, Hypothyroidism, elevated BP without dx of HTN, fatigue, L foot pain (since dec 2019), posterior cervical neck pain (since Dec 2019- worsens throughout the day, described as dull ache, rated 8/10). Hx of a benign brain tumor - VP shunt placed 2006 by Dr. Christella Noa is her neurosurgeon- has not been seen by this specialist in years. 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 Current wt 200- she reports diet high in saturated fat/CHO She denies any regular exercise She denies tobacco/vape use She reports 1 cocktail/week He father passed away from MI at age 25- heavy tobacco use Her mother has severe dementia/Alzheimers- mother is 2 years old, she estimates that mental decline started at in late 72s She has been seen by [facility] in past for chronic hip pain, never been seen for the cervical neck pain  Observations/Objective: No acute distress noted on phone  Assessment and Plan: Continue all mediations as directed Increase water intake, strive for at least 100 ounces/day.   Follow Heart Healthy diet Increase regular exercise.  Recommend at least 30 minutes daily, 5 days per week of walking, biking, swimming, YouTube/Pinterest workout videos. Regular F/U 3 months CPE with fasting labs 6 months  Follow Up  Instructions: Regular F/U 3 months CPE with fasting labs 6 months   I discussed the assessment and treatment plan with the patient. The patient was provided an opportunity to ask questions and all were answered. The patient agreed with the plan and demonstrated an understanding of the instructions.   The patient was advised to call back or seek an in-person evaluation if the symptoms worsen or if the condition fails to improve as anticipated.  I provided 30 minutes of non-face-to-face time during this encounter.   Esaw Grandchild, NP

## 2018-05-14 ENCOUNTER — Other Ambulatory Visit: Payer: Self-pay

## 2018-05-14 ENCOUNTER — Encounter: Payer: Self-pay | Admitting: Adult Health

## 2018-05-14 ENCOUNTER — Telehealth (INDEPENDENT_AMBULATORY_CARE_PROVIDER_SITE_OTHER): Payer: 59 | Admitting: Adult Health

## 2018-05-14 DIAGNOSIS — E119 Type 2 diabetes mellitus without complications: Secondary | ICD-10-CM

## 2018-05-14 DIAGNOSIS — Z0000 Encounter for general adult medical examination without abnormal findings: Secondary | ICD-10-CM | POA: Diagnosis not present

## 2018-05-14 DIAGNOSIS — E039 Hypothyroidism, unspecified: Secondary | ICD-10-CM

## 2018-05-14 NOTE — Assessment & Plan Note (Signed)
Currently on Levothyroxine 163mcg QD

## 2018-05-14 NOTE — Assessment & Plan Note (Signed)
Lab Results  Component Value Date   HGBA1C 5.7 10/28/2017   HGBA1C 5.8 10/24/2016   HGBA1C 5.5 08/31/2014  Not on metformin Has never been dx'd with T2D

## 2018-05-14 NOTE — Assessment & Plan Note (Signed)
Continue all mediations as directed Increase water intake, strive for at least 100 ounces/day.   Follow Heart Healthy diet Increase regular exercise.  Recommend at least 30 minutes daily, 5 days per week of walking, biking, swimming, YouTube/Pinterest workout videos. Regular F/U 3 months CPE with fasting labs 6 months

## 2018-05-14 NOTE — Assessment & Plan Note (Signed)
Chronic. 

## 2018-07-09 DIAGNOSIS — Z1231 Encounter for screening mammogram for malignant neoplasm of breast: Secondary | ICD-10-CM | POA: Diagnosis not present

## 2018-07-09 DIAGNOSIS — Z124 Encounter for screening for malignant neoplasm of cervix: Secondary | ICD-10-CM | POA: Diagnosis not present

## 2018-07-09 DIAGNOSIS — Z01419 Encounter for gynecological examination (general) (routine) without abnormal findings: Secondary | ICD-10-CM | POA: Diagnosis not present

## 2018-07-09 DIAGNOSIS — Z6831 Body mass index (BMI) 31.0-31.9, adult: Secondary | ICD-10-CM | POA: Diagnosis not present

## 2018-07-09 LAB — HM MAMMOGRAPHY

## 2018-07-15 ENCOUNTER — Other Ambulatory Visit: Payer: Self-pay | Admitting: Family Medicine

## 2018-07-24 ENCOUNTER — Other Ambulatory Visit: Payer: Self-pay

## 2018-07-24 MED ORDER — LEVOTHYROXINE SODIUM 125 MCG PO TABS: 125.0000 ug | ORAL_TABLET | Freq: Every day | ORAL | 1 refills | Status: DC

## 2018-07-28 ENCOUNTER — Other Ambulatory Visit: Payer: Self-pay | Admitting: Family Medicine

## 2018-08-11 ENCOUNTER — Ambulatory Visit (INDEPENDENT_AMBULATORY_CARE_PROVIDER_SITE_OTHER): Payer: 59 | Admitting: Adult Health

## 2018-08-11 ENCOUNTER — Other Ambulatory Visit: Payer: Self-pay

## 2018-08-11 ENCOUNTER — Encounter: Payer: Self-pay | Admitting: Adult Health

## 2018-08-11 DIAGNOSIS — M542 Cervicalgia: Secondary | ICD-10-CM | POA: Diagnosis not present

## 2018-08-11 DIAGNOSIS — F411 Generalized anxiety disorder: Secondary | ICD-10-CM | POA: Diagnosis not present

## 2018-08-11 DIAGNOSIS — Z0000 Encounter for general adult medical examination without abnormal findings: Secondary | ICD-10-CM | POA: Diagnosis not present

## 2018-08-11 MED ORDER — SERTRALINE HCL 50 MG PO TABS: ORAL_TABLET | ORAL | 0 refills | Status: DC

## 2018-08-11 NOTE — Assessment & Plan Note (Signed)
She reports excessive worry with HAs, difficulty concentrating, and muscle tension worsening over the last 12 months. She reports previously being on Sertraline to treat anxiety and would like to re-start SSRI. Please start Sertraline 50mg - 1/2 tablet daily for two weeks, then increase to one tablet daily. Follow up in 4 weeks TeleMedicine appt.

## 2018-08-11 NOTE — Assessment & Plan Note (Signed)
She new complaint of cervical neck pain that has progressively worsened since Dec 2019. Benign brain tumor - VP shunt placed 2006 byDr. Christella Noa, last contact with neurosurgeon was years ago. She feels that her neck pain is r/t to that surgery, since surgical approach was posterior neck. She has never been referred to PT for eval/tx- she is agreeable to referral.

## 2018-08-11 NOTE — Progress Notes (Signed)
Subjective:    Patient ID: Janet Douglas, female    DOB: 1965-12-11, 53 y.o.   MRN: [id number]  HPI:  05/14/2018 OV: [name] has called in today to establish as a new pt. PMH: Obesity, Hypothyroidism, elevated BP without dx of HTN, fatigue, L foot pain (since dec 2019), posterior cervical neck pain (since Dec 2019- worsens throughout the day, described as dull ache, rated 8/10). Hx of a benign brain tumor - VP shunt placed 2006 byDr. Christella Noa is her neurosurgeon- has not been seen by this specialist in years. 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 Current wt 200- she reports diet high in saturated fat/CHO She denies any regular exercise She denies tobacco/vape use She reports 1 cocktail/week He father passed away from MI at age 55- heavy tobacco use Her mother has severe dementia/Alzheimers- mother is 13 years old, she estimates that mental decline started at in late 5s She has been seen by [facility] in past for chronic hip pain, never been seen for the cervical neck pain  08/11/2018 OV: [name] presents for 3 month f/u: Obesity, Thyroid Disease, GAD She reports excessive worry with HAs, difficulty concentrating, and muscle tension worsening over the last 12 months. She reports previously being on Sertraline to treat anxiety and would like to re-start SSRI. 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  She states "every since [place] has been on the seen, there are so many packages and they are just getting heavier and heavier". She has been working with Orthopedic specialist for bil knee pain and most recently R shoulder pain. She new complaint of cervical neck pain that has progressively worsened since Dec 2019. Benign brain tumor - VP shunt placed 2006 byDr. Christella Noa, last contact with  neurosurgeon was years ago. She feels that her neck pain is r/t to that surgery, since surgical approach was posterior neck. She has never been referred to PT for eval/tx- she is agreeable to referral. She reports chronic imbalance and memory issues since the 2006 procedure, again she has not been followed by Neurology/Neurosurgery in years. She denies known hx of diabetes or elevated blood sugar  Lab Results  Component Value Date   HGBA1C 5.7 10/28/2017   HGBA1C 5.8 10/24/2016   HGBA1C 5.5 08/31/2014    Patient Care Team    Relationship Specialty Notifications Start End  Mina Marble D, NP PCP - General Family Medicine  05/13/18   Elsie Saas, MD Consulting Physician Orthopedic Surgery  05/14/18   Ashok Pall, MD Consulting Physician Neurosurgery  05/14/18   Paula Compton, MD Consulting Physician Obstetrics and Gynecology  05/14/18     Patient Active Problem List   Diagnosis Date Noted  . GAD (generalized anxiety disorder) 08/11/2018  . Healthcare maintenance 05/14/2018  . Peroneal tendinitis 05/20/2017  . Drooping eyelid, bilateral 12/25/2016  . Bloating 10/24/2016  . Colon cancer screening 10/24/2016  . Allergic eye reaction 02/17/2016  . Vision changes 08/31/2015  . Right shoulder pain 08/31/2015  . Perimenopausal 08/31/2015  . Nevus 08/31/2015  . Hyperglycemia 08/31/2014  . Fatigue 08/31/2014  . Perimenopause 08/31/2014  . Screening for lipoid disorders 08/31/2014  . Cough 01/14/2013  . History of brain tumor 11/28/2012  . Skin lesion of face 04/14/2012  . Obesity 06/05/2011  . Allergic rhinitis 05/18/2010  .  DEPRESSION, SITUATIONAL 06/15/2009  . ELEVATED BLOOD PRESSURE WITHOUT DIAGNOSIS OF HYPERTENSION 06/15/2009  . PLANTAR FASCIITIS 05/20/2008  . METATARSALGIA 05/06/2008  . OTHER ENTHESOPATHY OF ANKLE AND TARSUS 05/06/2008  . SESAMOIDITIS 11/04/2007  . NONORGANIC SLEEP DISORDER UNSPECIFIED 04/24/2007  . POOR CONCENTRATION 09/10/2006  . Hypothyroidism  09/09/2006  . Anemia 09/09/2006  . Cervical pain (neck) 09/09/2006  . TRANSAMINASES, SERUM, ELEVATED 09/09/2006  . ABNORMAL PAP SMEAR 09/09/2006     Past Medical History:  Diagnosis Date  . Anemia    NOS  . Brain tumor ([place])    S/P resection with shunt  . Hypothyroidism      Past Surgical History:  Procedure Laterality Date  . Abdominal US  05/19/1999   Negative  . Abdominal US  08/10   Normal  . 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 TUBAL LIGATION       Family History  Problem Relation Age of Onset  . Thyroid disease Mother   . Heart attack Father   . Hypertension Father   . Thyroid disease Sister   . Heart disease Sister        Low heart rate  . Hypertension Sister   . Thyroid disease Brother   . Hypertension Brother   . Thyroid disease Brother   . Colon cancer Neg Hx      Social History   Substance and Sexual Activity  Drug Use No     Social History   Substance and Sexual Activity  Alcohol Use Yes  . Alcohol/week: 0.0 standard drinks   Comment: Minimal- 1/week     Social History   Tobacco Use  Smoking Status Never Smoker  Smokeless Tobacco Never Used     Outpatient Encounter Medications as of 08/11/2018  Medication Sig  . acetaminophen (TYLENOL) 325 MG tablet Take 650 mg by mouth every 6 (six) hours as needed.  Marland Kitchen levothyroxine (SYNTHROID) 125 MCG tablet Take 1 tablet (125 mcg total) by mouth daily.  . meloxicam (MOBIC) 15 MG tablet Take 1 tablet (15 mg total) by mouth daily as needed for pain.  Marland Kitchen amoxicillin-clavulanate (AUGMENTIN) 875-125 MG tablet Take 1 tablet by mouth 2 (two) times daily.  . sertraline (ZOLOFT) 50 MG tablet 1/2 tablet daily for two weeks then increase to full tablet daily.  . [DISCONTINUED] MULTIPLE VITAMINS PO Take by mouth daily. Reported on 06/14/2015   No facility-administered encounter medications on file as of 08/11/2018.     Allergies: Bupropion hcl  Body mass index is 32.72 kg/m.  Blood  pressure 125/81, pulse 72, temperature 98.4 F (36.9 C), temperature source Oral, height 5\' 6"  (1.676 m), weight 202 lb 11.2 oz (91.9 kg), SpO2 96 %.  Review of Systems  Constitutional: Positive for fatigue. Negative for activity change, appetite change, chills, diaphoresis, fever and unexpected weight change.  Eyes: Negative for visual disturbance.  Respiratory: Negative for cough, chest tightness, shortness of breath, wheezing and stridor.   Cardiovascular: Negative for chest pain, palpitations and leg swelling.  Gastrointestinal: Negative for abdominal distention, anal bleeding, blood in stool, constipation, diarrhea, nausea and vomiting.  Endocrine: Negative for cold intolerance, heat intolerance, polydipsia, polyphagia and polyuria.  Musculoskeletal: Positive for arthralgias, myalgias, neck pain and neck stiffness. Negative for back pain, gait problem and joint swelling.  Neurological: Positive for headaches. Negative for dizziness, tremors and weakness.  Hematological: Negative for adenopathy. Does not bruise/bleed easily.  Psychiatric/Behavioral: Positive for decreased concentration. Negative for agitation, behavioral problems, confusion, dysphoric mood, hallucinations, self-injury, sleep  disturbance and suicidal ideas. The patient is nervous/anxious. The patient is not hyperactive.        Objective:   Physical Exam Vitals signs and nursing note reviewed.  Constitutional:      General: She is not in acute distress.    Appearance: Normal appearance. She is not ill-appearing or toxic-appearing.  Cardiovascular:     Rate and Rhythm: Normal rate.     Pulses: Normal pulses.     Heart sounds: Normal heart sounds. No murmur. No friction rub. No gallop.   Pulmonary:     Effort: Pulmonary effort is normal. No respiratory distress.     Breath sounds: Normal breath sounds. No stridor. No wheezing, rhonchi or rales.  Chest:     Chest wall: No tenderness.  Musculoskeletal:        General:  Tenderness present.     Cervical back: She exhibits tenderness.     Comments: Well healed linear posterior cervical surgical scar   Skin:    Capillary Refill: Capillary refill takes less than 2 seconds.  Neurological:     Mental Status: She is alert and oriented to person, place, and time.  Psychiatric:        Attention and Perception: Attention and perception normal.        Mood and Affect: Mood is anxious.        Speech: Speech is rapid and pressured.        Behavior: Behavior normal.        Thought Content: Thought content normal.        Cognition and Memory: Cognition normal. Memory is impaired.        Judgment: Judgment normal.       Assessment & Plan:   1. GAD (generalized anxiety disorder)   2. Healthcare maintenance   3. Cervical pain (neck)     Healthcare maintenance  Please start Sertraline 50mg - 1/2 tablet daily for two weeks, then increase to one tablet daily. Follow up in 4 weeks TeleMedicine appt. Referral to Phyical Therapy placed, re: Neck Pain. Increase water intake, strive for at least 100 ounces/day.   Follow Heart Healthy diet Keep physical appt this Sept. Continue to social distance and wear a mask when in public.  Cervical pain (neck) She new complaint of cervical neck pain that has progressively worsened since Dec 2019. Benign brain tumor - VP shunt placed 2006 byDr. Christella Noa, last contact with neurosurgeon was years ago. She feels that her neck pain is r/t to that surgery, since surgical approach was posterior neck. She has never been referred to PT for eval/tx- she is agreeable to referral.  GAD (generalized anxiety disorder) She reports excessive worry with HAs, difficulty concentrating, and muscle tension worsening over the last 12 months. She reports previously being on Sertraline to treat anxiety and would like to re-start SSRI. Please start Sertraline 50mg - 1/2 tablet daily for two weeks, then increase to one tablet daily. Follow up in 4  weeks TeleMedicine appt.    FOLLOW-UP:  Return in about 4 weeks (around 09/08/2018) for General Anxiety Disorder, Evaluate Medication Effectiveness, TeleMedicine Follow-Up.

## 2018-08-11 NOTE — Patient Instructions (Addendum)
Generalized Anxiety Disorder, Adult Generalized anxiety disorder (GAD) is a mental health disorder. People with this condition constantly worry about everyday events. Unlike normal anxiety, worry related to GAD is not triggered by a specific event. These worries also do not fade or get better with time. GAD interferes with life functions, including relationships, work, and school. GAD can vary from mild to severe. People with severe GAD can have intense waves of anxiety with physical symptoms (panic attacks). What are the causes? The exact cause of GAD is not known. What increases the risk? This condition is more likely to develop in:  Women.  People who have a family history of anxiety disorders.  People who are very shy.  People who experience very stressful life events, such as the death of a loved one.  People who have a very stressful family environment. What are the signs or symptoms? People with GAD often worry excessively about many things in their lives, such as their health and family. They may also be overly concerned about:  Doing well at work.  Being on time.  Natural disasters.  Friendships. Physical symptoms of GAD include:  Fatigue.  Muscle tension or having muscle twitches.  Trembling or feeling shaky.  Being easily startled.  Feeling like your heart is pounding or racing.  Feeling out of breath or like you cannot take a deep breath.  Having trouble falling asleep or staying asleep.  Sweating.  Nausea, diarrhea, or irritable bowel syndrome (IBS).  Headaches.  Trouble concentrating or remembering facts.  Restlessness.  Irritability. How is this diagnosed? Your health care provider can diagnose GAD based on your symptoms and medical history. You will also have a physical exam. The health care provider will ask specific questions about your symptoms, including how severe they are, when they started, and if they come and go. Your health care  provider may ask you about your use of alcohol or drugs, including prescription medicines. Your health care provider may refer you to a mental health specialist for further evaluation. Your health care provider will do a thorough examination and may perform additional tests to rule out other possible causes of your symptoms. To be diagnosed with GAD, a person must have anxiety that:  Is out of his or her control.  Affects several different aspects of his or her life, such as work and relationships.  Causes distress that makes him or her unable to take part in normal activities.  Includes at least three physical symptoms of GAD, such as restlessness, fatigue, trouble concentrating, irritability, muscle tension, or sleep problems. Before your health care provider can confirm a diagnosis of GAD, these symptoms must be present more days than they are not, and they must last for six months or longer. How is this treated? The following therapies are usually used to treat GAD:  Medicine. Antidepressant medicine is usually prescribed for long-term daily control. Antianxiety medicines may be added in severe cases, especially when panic attacks occur.  Talk therapy (psychotherapy). Certain types of talk therapy can be helpful in treating GAD by providing support, education, and guidance. Options include: ? Cognitive behavioral therapy (CBT). People learn coping skills and techniques to ease their anxiety. They learn to identify unrealistic or negative thoughts and behaviors and to replace them with positive ones. ? Acceptance and commitment therapy (ACT). This treatment teaches people how to be mindful as a way to cope with unwanted thoughts and feelings. ? Biofeedback. This process trains you to manage your body's response (  physiological response) through breathing techniques and relaxation methods. You will work with a therapist while machines are used to monitor your physical symptoms.  Stress  management techniques. These include yoga, meditation, and exercise. A mental health specialist can help determine which treatment is best for you. Some people see improvement with one type of therapy. However, other people require a combination of therapies. Follow these instructions at home:  Take over-the-counter and prescription medicines only as told by your health care provider.  Try to maintain a normal routine.  Try to anticipate stressful situations and allow extra time to manage them.  Practice any stress management or self-calming techniques as taught by your health care provider.  Do not punish yourself for setbacks or for not making progress.  Try to recognize your accomplishments, even if they are small.  Keep all follow-up visits as told by your health care provider. This is important. Contact a health care provider if:  Your symptoms do not get better.  Your symptoms get worse.  You have signs of depression, such as: ? A persistently sad, cranky, or irritable mood. ? Loss of enjoyment in activities that used to bring you joy. ? Change in weight or eating. ? Changes in sleeping habits. ? Avoiding friends or family members. ? Loss of energy for normal tasks. ? Feelings of guilt or worthlessness. Get help right away if:  You have serious thoughts about hurting yourself or others. If you ever feel like you may hurt yourself or others, or have thoughts about taking your own life, get help right away. You can go to your nearest emergency department or call:  Your local emergency services (911 in the [place]  A suicide crisis helpline, such as the [facility] at 3470668619. This is open 24 hours a day. Summary  Generalized anxiety disorder (GAD) is a mental health disorder that involves worry that is not triggered by a specific event.  People with GAD often worry excessively about many things in their lives, such as their health and  family.  GAD may cause physical symptoms such as restlessness, trouble concentrating, sleep problems, frequent sweating, nausea, diarrhea, headaches, and trembling or muscle twitching.  A mental health specialist can help determine which treatment is best for you. Some people see improvement with one type of therapy. However, other people require a combination of therapies. This information is not intended to replace advice given to you by your health care provider. Make sure you discuss any questions you have with your health care provider. Document Released: 06/02/2012 Document Revised: 12/27/2015 Document Reviewed: 12/27/2015 Elsevier Interactive Patient Education  2019 Manuel Garcia.  Please start Sertraline 50mg - 1/2 tablet daily for two weeks, then increase to one tablet daily. Follow up in 4 weeks TeleMedicine appt. Referral to Phyical Therapy placed, re: Neck Pain. Increase water intake, strive for at least 100 ounces/day.   Follow Heart Healthy diet Keep physical appt this Sept. Continue to social distance and wear a mask when in public. GREAT TO SEE YOU!

## 2018-08-11 NOTE — Assessment & Plan Note (Signed)
  Please start Sertraline 50mg - 1/2 tablet daily for two weeks, then increase to one tablet daily. Follow up in 4 weeks TeleMedicine appt. Referral to Phyical Therapy placed, re: Neck Pain. Increase water intake, strive for at least 100 ounces/day.   Follow Heart Healthy diet Keep physical appt this Sept. Continue to social distance and wear a mask when in public.

## 2018-09-06 ENCOUNTER — Other Ambulatory Visit: Payer: Self-pay | Admitting: Adult Health

## 2018-09-08 ENCOUNTER — Other Ambulatory Visit: Payer: Self-pay

## 2018-09-15 NOTE — Progress Notes (Signed)
Virtual Visit via Telephone Note  I connected with Janet Douglas on 09/16/2018 at 10:30 AM EDT by telephone and verified that I am speaking with the correct person using two identifiers.  Location: Patient: Home Provider: In Clinic   I discussed the limitations, risks, security and privacy concerns of performing an evaluation and management service by telephone and the availability of in person appointments. I also discussed with the patient that there may be a patient responsible charge related to this service. The patient expressed understanding and agreed to proceed.   History of Present Illness: 08/11/2018 OV: Janet Douglas presents for 3 month f/u: Obesity, Thyroid Disease, GAD She reports excessive worry with HAs, difficulty concentrating, and muscle tension worsening over the last 12 months. She reports previously being on Sertraline to treat anxiety and would like to re-start SSRI. 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  She states "every since [place] has been on the seen, there are so many packages and they are just getting heavier and heavier". She has been working with Orthopedic specialist for bil knee pain and most recently R shoulder pain. She new complaint of cervical neck pain that has progressively worsened since Dec 2019. Benignbrain tumor- VP shunt placed 2006 byDr. Christella Noa, last contact with neurosurgeon was years ago. She feels that her neck pain is r/t to that surgery, since surgical approach was posterior neck. She has never been referred to PT for eval/tx- she is agreeable to referral. She reports chronic imbalance and memory issues since the 2006 procedure, again she has not been followed by Neurology/Neurosurgery in years. She denies known hx of diabetes or elevated blood sugar 09/16/2018 OV: Janet Douglas is calling in today for f/u: GAD She was started on Sertraline and has titrated up to  50mg  tablet QD She reports very little  ETOH use She reports sound, restful sleep She reports significant reduction in overall anxiety, she denies SI/HI. She reports her work schedule has improved- now only working 40 hrs/week  She has established with PT to address R shoulder/neck pain. She reports constant, dull ache of L toe (>3 months) and believes that she has bunion- has never been evaluated by Podiatrist- she welcomes a referral    Patient Care Team    Relationship Specialty Notifications Start End  Mina Marble D, NP PCP - General Family Medicine  05/13/18   Elsie Saas, [place] Physician Orthopedic Surgery  05/14/18   Ashok Pall, MD Consulting Physician Neurosurgery  05/14/18   Paula Compton, MD Consulting Physician Obstetrics and Gynecology  05/14/18     Patient Active Problem List   Diagnosis Date Noted  . GAD (generalized anxiety disorder) 08/11/2018  . Healthcare maintenance 05/14/2018  . Peroneal tendinitis 05/20/2017  . Drooping eyelid, bilateral 12/25/2016  . Bloating 10/24/2016  . Colon cancer screening 10/24/2016  . Allergic eye reaction 02/17/2016  . Vision changes 08/31/2015  . Right shoulder pain 08/31/2015  . Perimenopausal 08/31/2015  . Nevus 08/31/2015  . Hyperglycemia 08/31/2014  . Fatigue 08/31/2014  . Perimenopause 08/31/2014  . Screening for lipoid disorders 08/31/2014  . Cough 01/14/2013  . History of brain tumor 11/28/2012  . Skin lesion of face 04/14/2012  . Obesity 06/05/2011  . Allergic rhinitis 05/18/2010  . DEPRESSION, SITUATIONAL 06/15/2009  . ELEVATED BLOOD PRESSURE WITHOUT DIAGNOSIS OF HYPERTENSION 06/15/2009  . PLANTAR FASCIITIS 05/20/2008  . METATARSALGIA 05/06/2008  . OTHER ENTHESOPATHY OF ANKLE AND TARSUS 05/06/2008  . SESAMOIDITIS 11/04/2007  .  NONORGANIC SLEEP DISORDER UNSPECIFIED 04/24/2007  . POOR CONCENTRATION 09/10/2006  . Hypothyroidism 09/09/2006  . Anemia 09/09/2006  . Cervical pain (neck) 09/09/2006  . TRANSAMINASES, SERUM, ELEVATED  09/09/2006  . ABNORMAL PAP SMEAR 09/09/2006     Past Medical History:  Diagnosis Date  . Anemia    NOS  . Brain tumor ([place])    S/P resection with shunt  . Hypothyroidism      Past Surgical History:  Procedure Laterality Date  . Abdominal US  05/19/1999   Negative  . Abdominal US  08/10   Normal  . 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 TUBAL LIGATION       Family History  Problem Relation Age of Onset  . Thyroid disease Mother   . Heart attack Father   . Hypertension Father   . Thyroid disease Sister   . Heart disease Sister        Low heart rate  . Hypertension Sister   . Thyroid disease Brother   . Hypertension Brother   . Thyroid disease Brother   . Colon cancer Neg Hx      Social History   Substance and Sexual Activity  Drug Use No     Social History   Substance and Sexual Activity  Alcohol Use Yes  . Alcohol/week: 0.0 standard drinks   Comment: Minimal- 1/week     Social History   Tobacco Use  Smoking Status Never Smoker  Smokeless Tobacco Never Used     Outpatient Encounter Medications as of 09/16/2018  Medication Sig  . acetaminophen (TYLENOL) 325 MG tablet Take 650 mg by mouth every 6 (six) hours as needed.  Marland Kitchen levothyroxine (SYNTHROID) 125 MCG tablet Take 1 tablet (125 mcg total) by mouth daily.  . predniSONE (STERAPRED UNI-PAK 21 TAB) 10 MG (21) TBPK tablet Use as directed  . sertraline (ZOLOFT) 50 MG tablet Take 1 tablet (50 mg total) by mouth daily.  Marland Kitchen tiZANidine (ZANAFLEX) 2 MG tablet Take 1 tablet by mouth 2 (two) times a day.  . [DISCONTINUED] sertraline (ZOLOFT) 50 MG tablet Take 50 mg by mouth daily.  . [DISCONTINUED] meloxicam (MOBIC) 15 MG tablet Take 1 tablet (15 mg total) by mouth daily as needed for pain.  . [DISCONTINUED] sertraline (ZOLOFT) 50 MG tablet TAKE 1/2 TABLET BY MOUTH  DAILY FOR TWO WEEKS THEN  INCREASE TO 1 FULL TABLET  DAILY.   No facility-administered encounter medications on file as of  09/16/2018.     Allergies: Bupropion hcl  Body mass index is 31.96 kg/m.  Temperature (!) 97 F (36.1 C), temperature source Oral, height 5\' 6"  (1.676 m), weight 198 lb (89.8 kg). Review of Systems: General:   Denies fever, chills, unexplained weight loss.  Optho/Auditory:   Denies visual changes, blurred vision/LOV Respiratory:   Denies SOB, DOE more than baseline levels.  Cardiovascular:   Denies chest pain, palpitations, new onset peripheral edema  Gastrointestinal:   Denies nausea, vomiting, diarrhea.  Genitourinary: Denies dysuria, freq/ urgency, flank pain or discharge from genitals.  Endocrine:     Denies hot or cold intolerance, polyuria, polydipsia. Musculoskeletal:    Myalgias +, joint swelling,arthralgias +, gait problems +.  Skin:  Denies rash, suspicious lesions Neurological:     Denies dizziness, unexplained weakness, numbness  Psychiatric/Behavioral:   Denies mood changes, suicidal or homicidal ideations, hallucinations This patient does not have sx concerning for COVID-19 Infection (ie; fever, chills, cough, new or worsening shortness of breath).   Observations/Objective: No  acute distress noted during the telephone conversation  Assessment and Plan: Continue all medications as directed Remain well hydrated, follow heart healthy diet Continue with PT Referral to Podiatry placed, re: L great toe pain poss bunion Continue to social distance and wear a mask when in public Follow Up Instructions: F/u 10/2018   I discussed the assessment and treatment plan with the patient. The patient was provided an opportunity to ask questions and all were answered. The patient agreed with the plan and demonstrated an understanding of the instructions.   The patient was advised to call back or seek an in-person evaluation if the symptoms worsen or if the condition fails to improve as anticipated.  I provided 18 minutes of non-face-to-face time during this encounter.   Esaw Grandchild, NP

## 2018-09-16 ENCOUNTER — Ambulatory Visit (INDEPENDENT_AMBULATORY_CARE_PROVIDER_SITE_OTHER): Payer: 59 | Admitting: Adult Health

## 2018-09-16 ENCOUNTER — Other Ambulatory Visit: Payer: Self-pay

## 2018-09-16 ENCOUNTER — Encounter: Payer: Self-pay | Admitting: Adult Health

## 2018-09-16 VITALS — Temp 97.0°F | Ht 66.0 in | Wt 198.0 lb

## 2018-09-16 DIAGNOSIS — F411 Generalized anxiety disorder: Secondary | ICD-10-CM | POA: Diagnosis not present

## 2018-09-16 DIAGNOSIS — M79675 Pain in left toe(s): Secondary | ICD-10-CM | POA: Diagnosis not present

## 2018-09-16 DIAGNOSIS — M79676 Pain in unspecified toe(s): Secondary | ICD-10-CM | POA: Insufficient documentation

## 2018-09-16 DIAGNOSIS — Z0000 Encounter for general adult medical examination without abnormal findings: Secondary | ICD-10-CM

## 2018-09-16 MED ORDER — SERTRALINE HCL 50 MG PO TABS: 50.0000 mg | ORAL_TABLET | Freq: Every day | ORAL | 0 refills | Status: DC

## 2018-09-16 NOTE — Assessment & Plan Note (Signed)
She reports constant, dull ache of L toe (>3 months) and believes that she has bunion- has never been evaluated by Podiatrist- she welcomes a referral

## 2018-09-16 NOTE — Assessment & Plan Note (Signed)
Assessment and Plan: Continue all medications as directed Remain well hydrated, follow heart healthy diet Continue with PT Referral to Podiatry placed, re: L great toe pain poss bunion Continue to social distance and wear a mask when in public Follow Up Instructions: F/u 10/2018   I discussed the assessment and treatment plan with the patient. The patient was provided an opportunity to ask questions and all were answered. The patient agreed with the plan and demonstrated an understanding of the instructions.

## 2018-09-16 NOTE — Assessment & Plan Note (Signed)
She was started on Sertraline and has titrated up to  50mg  tablet QD She reports very little ETOH use She reports sound, restful sleep She reports significant reduction in overall anxiety, she denies SI/HI.

## 2018-09-22 ENCOUNTER — Ambulatory Visit (INDEPENDENT_AMBULATORY_CARE_PROVIDER_SITE_OTHER): Payer: 59

## 2018-09-22 ENCOUNTER — Encounter: Payer: Self-pay | Admitting: Podiatry

## 2018-09-22 ENCOUNTER — Other Ambulatory Visit: Payer: Self-pay | Admitting: Podiatry

## 2018-09-22 ENCOUNTER — Other Ambulatory Visit: Payer: Self-pay

## 2018-09-22 ENCOUNTER — Ambulatory Visit: Payer: 59 | Admitting: Podiatry

## 2018-09-22 VITALS — Temp 97.6°F

## 2018-09-22 DIAGNOSIS — M779 Enthesopathy, unspecified: Secondary | ICD-10-CM

## 2018-09-22 DIAGNOSIS — M21622 Bunionette of left foot: Secondary | ICD-10-CM

## 2018-09-22 DIAGNOSIS — M79673 Pain in unspecified foot: Secondary | ICD-10-CM

## 2018-09-22 DIAGNOSIS — M21621 Bunionette of right foot: Secondary | ICD-10-CM | POA: Diagnosis not present

## 2018-09-22 DIAGNOSIS — M7752 Other enthesopathy of left foot: Secondary | ICD-10-CM | POA: Diagnosis not present

## 2018-09-22 DIAGNOSIS — R2681 Unsteadiness on feet: Secondary | ICD-10-CM

## 2018-09-22 NOTE — Progress Notes (Signed)
   Subjective:    Patient ID: Janet Douglas, female    DOB: 1965-04-20, 53 y.o.   MRN: [id number]  HPI    Review of Systems  All other systems reviewed and are negative.      Objective:   Physical Exam        Assessment & Plan:

## 2018-09-24 NOTE — Progress Notes (Signed)
Subjective:   Patient ID: Janet Douglas, female   DOB: 53 y.o.   MRN: [id number]   HPI Patient presents stating having a lot of pain around the fifth metatarsal head of the left foot and states there is been fluid buildup around it and it is been hurting for several months and she does not remember injury or shoe gear change.  The right is enlarged but not painful currently.  Patient does not smoke likes to be active   Review of Systems  All other systems reviewed and are negative.       Objective:  Physical Exam Vitals signs and nursing note reviewed.  Constitutional:      Appearance: She is well-developed.  Pulmonary:     Effort: Pulmonary effort is normal.  Musculoskeletal: Normal range of motion.  Skin:    General: Skin is warm.  Neurological:     Mental Status: She is alert.     Neurovascular status intact muscle strength found to be adequate range of motion within normal limits.  Patient is noted to have inflammation fluid around the head of the fifth metatarsal left with pain with palpation and redness and does have moderate prominence of the right fifth metatarsal head.  Patient states it is been very tender and making shoe gear difficult left     Assessment:  Inflammatory tailor's bunion deformity left with fluid buildup around the head of the fifth metatarsal left over right with structural changes noted     Plan:  H&P reviewed condition and today I went ahead did sterile prep and injected the fifth MPJ 3 mg Dexasone Kenalog 5 mg Xylocaine advised on wider shoes soaks and if symptoms persist discussed metatarsal osteotomy which may be necessary.  Patient will be seen back to recheck again and hopefully symptoms will improve  X-rays indicate there is structural tailor's bunion deformity with spur formation fifth metatarsal bilateral

## 2018-10-09 ENCOUNTER — Other Ambulatory Visit: Payer: Self-pay | Admitting: Adult Health

## 2018-10-24 ENCOUNTER — Other Ambulatory Visit: Payer: Self-pay | Admitting: Neurosurgery

## 2018-10-24 DIAGNOSIS — M4722 Other spondylosis with radiculopathy, cervical region: Secondary | ICD-10-CM

## 2018-11-03 ENCOUNTER — Other Ambulatory Visit: Payer: 59

## 2018-11-03 ENCOUNTER — Other Ambulatory Visit: Payer: Self-pay

## 2018-11-03 DIAGNOSIS — R030 Elevated blood-pressure reading, without diagnosis of hypertension: Secondary | ICD-10-CM

## 2018-11-03 DIAGNOSIS — Z0000 Encounter for general adult medical examination without abnormal findings: Secondary | ICD-10-CM

## 2018-11-03 DIAGNOSIS — D649 Anemia, unspecified: Secondary | ICD-10-CM

## 2018-11-03 DIAGNOSIS — Z13220 Encounter for screening for lipoid disorders: Secondary | ICD-10-CM

## 2018-11-03 DIAGNOSIS — E039 Hypothyroidism, unspecified: Secondary | ICD-10-CM

## 2018-11-03 DIAGNOSIS — R739 Hyperglycemia, unspecified: Secondary | ICD-10-CM

## 2018-11-04 ENCOUNTER — Other Ambulatory Visit: Payer: Self-pay | Admitting: Adult Health

## 2018-11-04 DIAGNOSIS — E039 Hypothyroidism, unspecified: Secondary | ICD-10-CM

## 2018-11-04 LAB — COMPREHENSIVE METABOLIC PANEL
ALT: 18 IU/L (ref 0–32)
AST: 19 IU/L (ref 0–40)
Albumin/Globulin Ratio: 1.8 (ref 1.2–2.2)
Albumin: 4.5 g/dL (ref 3.8–4.9)
Alkaline Phosphatase: 86 IU/L (ref 39–117)
BUN/Creatinine Ratio: 15 (ref 9–23)
BUN: 13 mg/dL (ref 6–24)
Bilirubin Total: 0.4 mg/dL (ref 0.0–1.2)
CO2: 24 mmol/L (ref 20–29)
Calcium: 9.5 mg/dL (ref 8.7–10.2)
Chloride: 102 mmol/L (ref 96–106)
Creatinine, Ser: 0.85 mg/dL (ref 0.57–1.00)
GFR calc Af Amer: 90 mL/min/{1.73_m2} (ref >59)
GFR calc non Af Amer: 78 mL/min/{1.73_m2} (ref >59)
Globulin, Total: 2.5 g/dL (ref 1.5–4.5)
Glucose: 97 mg/dL (ref 65–99)
Potassium: 4.5 mmol/L (ref 3.5–5.2)
Sodium: 139 mmol/L (ref 134–144)
Total Protein: 7 g/dL (ref 6.0–8.5)

## 2018-11-04 LAB — CBC WITH DIFFERENTIAL/PLATELET
Basophils Absolute: 0.1 10*3/uL (ref 0.0–0.2)
Basos: 1 %
EOS (ABSOLUTE): 0.1 10*3/uL (ref 0.0–0.4)
Eos: 2 %
Hematocrit: 39 % (ref 34.0–46.6)
Hemoglobin: 12.3 g/dL (ref 11.1–15.9)
Immature Grans (Abs): 0 10*3/uL (ref 0.0–0.1)
Immature Granulocytes: 0 %
Lymphocytes Absolute: 1.6 10*3/uL (ref 0.7–3.1)
Lymphs: 22 %
MCH: 21 pg — ABNORMAL LOW (ref 26.6–33.0)
MCHC: 31.5 g/dL (ref 31.5–35.7)
MCV: 67 fL — ABNORMAL LOW (ref 79–97)
Monocytes Absolute: 0.5 10*3/uL (ref 0.1–0.9)
Monocytes: 7 %
Neutrophils Absolute: 4.9 10*3/uL (ref 1.4–7.0)
Neutrophils: 68 %
Platelets: 237 10*3/uL (ref 150–450)
RBC: 5.85 x10E6/uL — ABNORMAL HIGH (ref 3.77–5.28)
RDW: 16.6 % — ABNORMAL HIGH (ref 11.7–15.4)
WBC: 7.1 10*3/uL (ref 3.4–10.8)

## 2018-11-04 LAB — TSH: TSH: 0.166 u[IU]/mL — ABNORMAL LOW (ref 0.450–4.500)

## 2018-11-04 LAB — LIPID PANEL
Chol/HDL Ratio: 4.6 ratio — ABNORMAL HIGH (ref 0.0–4.4)
Cholesterol, Total: 160 mg/dL (ref 100–199)
HDL: 35 mg/dL — ABNORMAL LOW (ref >39)
LDL Chol Calc (NIH): 95 mg/dL (ref 0–99)
Triglycerides: 173 mg/dL — ABNORMAL HIGH (ref 0–149)
VLDL Cholesterol Cal: 30 mg/dL (ref 5–40)

## 2018-11-04 LAB — HEMOGLOBIN A1C
Est. average glucose Bld gHb Est-mCnc: 114 mg/dL
Hgb A1c MFr Bld: 5.6 % (ref 4.8–5.6)

## 2018-11-04 MED ORDER — LEVOTHYROXINE SODIUM 112 MCG PO TABS: 112.0000 ug | ORAL_TABLET | Freq: Every day | ORAL | 0 refills | Status: DC

## 2018-11-10 ENCOUNTER — Telehealth: Payer: Self-pay | Admitting: Adult Health

## 2018-11-10 NOTE — Telephone Encounter (Signed)
Patient's CPE had to be R/S'd and was happy to R/S, she only wanted to know if there was anything more she needed to know about the reduction in the thyroid med and if there is anything lab wise she needs to know prior to the new appt.  Also, every time she goes to the dentist she starts on a round of abx and was wondering if Valetta Fuller could prescribe one prior to her appt on the 30th. She typically takes amoxacillin and was wondering if Valetta Fuller would send an order to [facility] on [address]

## 2018-11-10 NOTE — Progress Notes (Deleted)
Subjective:    Patient ID: Janet Douglas, female    DOB: February 14, 1966, 53 y.o.   MRN: [id number]  HPI:  Janet Douglas is here for CPE  11/03/2018 Labs- A1c-5.6 TSH-low-0.166-  I reduced her levothyroxine from 183mcg to 112 mcg  She will need to have her TSH re-checked in 6 weeks.  CMP-stable CBC-stable- hx of anemia The 10-year ASCVD risk score Mikey Bussing DC Jr., et al., 2013) is: 3.7%   Values used to calculate the score:     Age: 43 years     Sex: Female     Is Non-Hispanic African American: No     Diabetic: Yes     Tobacco smoker: No     Systolic Blood Pressure: 0000000 mmHg     Is BP treated: No     HDL Cholesterol: 35 mg/dL     Total Cholesterol: 160 mg/dL  LDL-95   Healthcare Maintenance: PAP- Mammogram- Colonoscopy- Immunizations-   Patient Care Team    Relationship Specialty Notifications Start End  Mina Marble D, NP PCP - General Family Medicine  05/13/18   Elsie Saas, [place] Physician Orthopedic Surgery  05/14/18   Ashok Pall, MD Consulting Physician Neurosurgery  05/14/18   Paula Compton, MD Consulting Physician Obstetrics and Gynecology  05/14/18     Patient Active Problem List   Diagnosis Date Noted  . Toe pain 09/16/2018  . GAD (generalized anxiety disorder) 08/11/2018  . Healthcare maintenance 05/14/2018  . Peroneal tendinitis 05/20/2017  . Drooping eyelid, bilateral 12/25/2016  . Bloating 10/24/2016  . Colon cancer screening 10/24/2016  . Allergic eye reaction 02/17/2016  . Vision changes 08/31/2015  . Right shoulder pain 08/31/2015  . Perimenopausal 08/31/2015  . Nevus 08/31/2015  . Hyperglycemia 08/31/2014  . Fatigue 08/31/2014  . Perimenopause 08/31/2014  . Screening for lipoid disorders 08/31/2014  . Cough 01/14/2013  . History of brain tumor 11/28/2012  . Skin lesion of face 04/14/2012  . Obesity 06/05/2011  . Allergic rhinitis 05/18/2010  . DEPRESSION, SITUATIONAL 06/15/2009  . ELEVATED BLOOD PRESSURE WITHOUT DIAGNOSIS OF  HYPERTENSION 06/15/2009  . PLANTAR FASCIITIS 05/20/2008  . METATARSALGIA 05/06/2008  . OTHER ENTHESOPATHY OF ANKLE AND TARSUS 05/06/2008  . SESAMOIDITIS 11/04/2007  . NONORGANIC SLEEP DISORDER UNSPECIFIED 04/24/2007  . POOR CONCENTRATION 09/10/2006  . Hypothyroidism 09/09/2006  . Anemia 09/09/2006  . Cervical pain (neck) 09/09/2006  . TRANSAMINASES, SERUM, ELEVATED 09/09/2006  . ABNORMAL PAP SMEAR 09/09/2006     Past Medical History:  Diagnosis Date  . Anemia    NOS  . Brain tumor ([place])    S/P resection with shunt  . Hypothyroidism      Past Surgical History:  Procedure Laterality Date  . Abdominal US  05/19/1999   Negative  . Abdominal US  08/10   Normal  . 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 TUBAL LIGATION       Family History  Problem Relation Age of Onset  . Thyroid disease Mother   . Heart attack Father   . Hypertension Father   . Thyroid disease Sister   . Heart disease Sister        Low heart rate  . Hypertension Sister   . Thyroid disease Brother   . Hypertension Brother   . Thyroid disease Brother   . Colon cancer Neg Hx      Social History   Substance and Sexual Activity  Drug Use No     Social History   Substance  and Sexual Activity  Alcohol Use Yes  . Alcohol/week: 0.0 standard drinks   Comment: Minimal- 1/week     Social History   Tobacco Use  Smoking Status Never Smoker  Smokeless Tobacco Never Used     Outpatient Encounter Medications as of 11/11/2018  Medication Sig  . acetaminophen (TYLENOL) 325 MG tablet Take 650 mg by mouth every 6 (six) hours as needed.  Marland Kitchen levothyroxine (SYNTHROID) 112 MCG tablet Take 1 tablet (112 mcg total) by mouth daily.  . meloxicam (MOBIC) 15 MG tablet Take 15 mg by mouth as needed for pain.  Marland Kitchen sertraline (ZOLOFT) 50 MG tablet Take 1 tablet (50 mg total) by mouth daily.  Marland Kitchen tiZANidine (ZANAFLEX) 2 MG tablet Take 1 tablet by mouth 2 (two) times a day.   No facility-administered  encounter medications on file as of 11/11/2018.     Allergies: Bupropion hcl  There is no height or weight on file to calculate BMI.  There were no vitals taken for this visit.     Review of Systems     Objective:   Physical Exam        Assessment & Plan:  No diagnosis found.  No problem-specific Assessment & Plan notes found for this encounter.    FOLLOW-UP:  No follow-ups on file.

## 2018-11-11 ENCOUNTER — Encounter: Payer: 59 | Admitting: Adult Health

## 2018-11-11 NOTE — Telephone Encounter (Signed)
Good Morning Dorothea Ogle, Can you please call Ms. Chachere and share- TSH-low-0.166-  I reduced her levothyroxine from 123mcg to 112 mcg  She will need to have her TSH re-checked in 6 weeks.  Her dentist should Rx the ABX since they are performing the cleaning/procedure. Sincerely, Valetta Fuller

## 2018-11-13 ENCOUNTER — Telehealth: Payer: Self-pay | Admitting: Adult Health

## 2018-11-13 NOTE — Telephone Encounter (Signed)
Patient called states she normally takes Amoxicillin Rx prior to any Dentist appt/ Tues 9/29--Pt request provider call in Rx.  --Forwarding message to medical assistant for review w/provider---If questions pls call pt @ [phone]. --Janet Douglas

## 2018-11-13 NOTE — Telephone Encounter (Signed)
Pt states that she has a shunt following a brain tumor and that for the last 14 years she has been given Amoxicillin prior to dental appts.  Pt is requesting RX for her dentist appt on 11/18/2018.  Charyl Bigger, CMA

## 2018-11-13 NOTE — Telephone Encounter (Signed)
Good Evening, I previously replied to pt that she should request ABX from the dentist. Can you please call and inform

## 2018-11-14 ENCOUNTER — Other Ambulatory Visit: Payer: Self-pay | Admitting: Orthopedic Surgery

## 2018-11-14 DIAGNOSIS — M25511 Pain in right shoulder: Secondary | ICD-10-CM

## 2018-11-14 NOTE — Telephone Encounter (Signed)
Good Morning Janet Douglas, Since the dentist is performing the cleaning/procedure they should follow their American Dental Association Guidelines in regards to prophylactic ABX prescriptions. Again, I recommend that she request ABX from her dentist. Dentist are capable of rx's ABX and pain medications- do so on a regular basis. Thanks! Valetta Fuller

## 2018-11-17 NOTE — Telephone Encounter (Signed)
Pt informed.  Pt expressed understanding and is agreeable.  T. Azelia Reiger, CMA  

## 2018-11-18 ENCOUNTER — Other Ambulatory Visit: Payer: Self-pay

## 2018-11-18 ENCOUNTER — Ambulatory Visit
Admission: RE | Admit: 2018-11-18 | Discharge: 2018-11-18 | Disposition: A | Discharge status: Home / Self Care | Payer: 59 | Source: Ambulatory Visit | Attending: Neurosurgery | Admitting: Neurosurgery

## 2018-11-18 DIAGNOSIS — M4722 Other spondylosis with radiculopathy, cervical region: Secondary | ICD-10-CM

## 2018-11-27 ENCOUNTER — Encounter: Payer: 59 | Admitting: Adult Health

## 2018-12-03 NOTE — Progress Notes (Signed)
Received Epic notification that pt has not read MyChart message regarding results.  Letter mailed to pt with results.  Charyl Bigger, CMA

## 2018-12-04 ENCOUNTER — Other Ambulatory Visit: Payer: Self-pay

## 2018-12-04 ENCOUNTER — Ambulatory Visit
Admission: RE | Admit: 2018-12-04 | Discharge: 2018-12-04 | Disposition: A | Discharge status: Home / Self Care | Payer: 59 | Source: Ambulatory Visit | Attending: Orthopedic Surgery | Admitting: Orthopedic Surgery

## 2018-12-04 DIAGNOSIS — M25511 Pain in right shoulder: Secondary | ICD-10-CM

## 2019-01-01 ENCOUNTER — Encounter: Payer: Self-pay | Admitting: Adult Health

## 2019-01-01 ENCOUNTER — Ambulatory Visit (INDEPENDENT_AMBULATORY_CARE_PROVIDER_SITE_OTHER): Payer: 59 | Admitting: Adult Health

## 2019-01-01 ENCOUNTER — Other Ambulatory Visit: Payer: Self-pay

## 2019-01-01 DIAGNOSIS — Z87898 Personal history of other specified conditions: Secondary | ICD-10-CM

## 2019-01-01 DIAGNOSIS — Z0000 Encounter for general adult medical examination without abnormal findings: Secondary | ICD-10-CM

## 2019-01-01 DIAGNOSIS — E039 Hypothyroidism, unspecified: Secondary | ICD-10-CM | POA: Diagnosis not present

## 2019-01-01 DIAGNOSIS — R739 Hyperglycemia, unspecified: Secondary | ICD-10-CM

## 2019-01-01 MED ORDER — SERTRALINE HCL 50 MG PO TABS: 50.0000 mg | ORAL_TABLET | Freq: Every day | ORAL | 0 refills | Status: DC

## 2019-01-01 NOTE — Patient Instructions (Addendum)
Preventive Care for Adults, Female  A healthy lifestyle and preventive care can promote health and wellness. Preventive health guidelines for women include the following key practices.   A routine yearly physical is a good way to check with your health care provider about your health and preventive screening. It is a chance to share any concerns and updates on your health and to receive a thorough exam.   Visit your dentist for a routine exam and preventive care every 6 months. Brush your teeth twice a day and floss once a day. Good oral hygiene prevents tooth decay and gum disease.   The frequency of eye exams is based on your age, health, family medical history, use of contact lenses, and other factors. Follow your health care provider's recommendations for frequency of eye exams.   Eat a healthy diet. Foods like vegetables, fruits, whole grains, low-fat dairy products, and lean protein foods contain the nutrients you need without too many calories. Decrease your intake of foods high in solid fats, added sugars, and salt. Eat the right amount of calories for you.Get information about a proper diet from your health care provider, if necessary.   Regular physical exercise is one of the most important things you can do for your health. Most adults should get at least 150 minutes of moderate-intensity exercise (any activity that increases your heart rate and causes you to sweat) each week. In addition, most adults need muscle-strengthening exercises on 2 or more days a week.   Maintain a healthy weight. The body mass index (BMI) is a screening tool to identify possible weight problems. It provides an estimate of body fat based on height and weight. Your health care provider can find your BMI, and can help you achieve or maintain a healthy weight.For adults 20 years and older:   - A BMI below 18.5 is considered underweight.   - A BMI of 18.5 to 24.9 is normal.   - A BMI of 25 to 29.9 is  considered overweight.   - A BMI of 30 and above is considered obese.   Maintain normal blood lipids and cholesterol levels by exercising and minimizing your intake of trans and saturated fats.  Eat a balanced diet with plenty of fruit and vegetables. Blood tests for lipids and cholesterol should begin at age 20 and be repeated every 5 years minimum.  If your lipid or cholesterol levels are high, you are over 40, or you are at high risk for heart disease, you may need your cholesterol levels checked more frequently.Ongoing high lipid and cholesterol levels should be treated with medicines if diet and exercise are not working.   If you smoke, find out from your health care provider how to quit. If you do not use tobacco, do not start.   Lung cancer screening is recommended for adults aged 55-80 years who are at high risk for developing lung cancer because of a history of smoking. A yearly low-dose CT scan of the lungs is recommended for people who have at least a 30-pack-year history of smoking and are a current smoker or have quit within the past 15 years. A pack year of smoking is smoking an average of 1 pack of cigarettes a day for 1 year (for example: 1 pack a day for 30 years or 2 packs a day for 15 years). Yearly screening should continue until the smoker has stopped smoking for at least 15 years. Yearly screening should be stopped for people who develop a   health problem that would prevent them from having lung cancer treatment.   If you are pregnant, do not drink alcohol. If you are breastfeeding, be very cautious about drinking alcohol. If you are not pregnant and choose to drink alcohol, do not have more than 1 drink per day. One drink is considered to be 12 ounces (355 mL) of beer, 5 ounces (148 mL) of wine, or 1.5 ounces (44 mL) of liquor.   Avoid use of street drugs. Do not share needles with anyone. Ask for help if you need support or instructions about stopping the use of  drugs.   High blood pressure causes heart disease and increases the risk of stroke. Your blood pressure should be checked at least yearly.  Ongoing high blood pressure should be treated with medicines if weight loss and exercise do not work.   If you are 69-55 years old, ask your health care provider if you should take aspirin to prevent strokes.   Diabetes screening involves taking a blood sample to check your fasting blood sugar level. This should be done once every 3 years, after age 38, if you are within normal weight and without risk factors for diabetes. Testing should be considered at a younger age or be carried out more frequently if you are overweight and have at least 1 risk factor for diabetes.   Breast cancer screening is essential preventive care for women. You should practice "breast self-awareness."  This means understanding the normal appearance and feel of your breasts and may include breast self-examination.  Any changes detected, no matter how small, should be reported to a health care provider.  Women in their 80s and 30s should have a clinical breast exam (CBE) by a health care provider as part of a regular health exam every 1 to 3 years.  After age 66, women should have a CBE every year.  Starting at age 1, women should consider having a mammogram (breast X-ray test) every year.  Women who have a family history of breast cancer should talk to their health care provider about genetic screening.  Women at a high risk of breast cancer should talk to their health care providers about having an MRI and a mammogram every year.   -Breast cancer gene (BRCA)-related cancer risk assessment is recommended for women who have family members with BRCA-related cancers. BRCA-related cancers include breast, ovarian, tubal, and peritoneal cancers. Having family members with these cancers may be associated with an increased risk for harmful changes (mutations) in the breast cancer genes BRCA1 and  BRCA2. Results of the assessment will determine the need for genetic counseling and BRCA1 and BRCA2 testing.   The Pap test is a screening test for cervical cancer. A Pap test can show cell changes on the cervix that might become cervical cancer if left untreated. A Pap test is a procedure in which cells are obtained and examined from the lower end of the uterus (cervix).   - Women should have a Pap test starting at age 57.   - Between ages 90 and 70, Pap tests should be repeated every 2 years.   - Beginning at age 63, you should have a Pap test every 3 years as long as the past 3 Pap tests have been normal.   - Some women have medical problems that increase the chance of getting cervical cancer. Talk to your health care provider about these problems. It is especially important to talk to your health care provider if a  new problem develops soon after your last Pap test. In these cases, your health care provider may recommend more frequent screening and Pap tests.   - The above recommendations are the same for women who have or have not gotten the vaccine for human papillomavirus (HPV).   - If you had a hysterectomy for a problem that was not cancer or a condition that could lead to cancer, then you no longer need Pap tests. Even if you no longer need a Pap test, a regular exam is a good idea to make sure no other problems are starting.   - If you are between ages 36 and 66 years, and you have had normal Pap tests going back 10 years, you no longer need Pap tests. Even if you no longer need a Pap test, a regular exam is a good idea to make sure no other problems are starting.   - If you have had past treatment for cervical cancer or a condition that could lead to cancer, you need Pap tests and screening for cancer for at least 20 years after your treatment.   - If Pap tests have been discontinued, risk factors (such as a new sexual partner) need to be reassessed to determine if screening should  be resumed.   - The HPV test is an additional test that may be used for cervical cancer screening. The HPV test looks for the virus that can cause the cell changes on the cervix. The cells collected during the Pap test can be tested for HPV. The HPV test could be used to screen women aged 70 years and older, and should be used in women of any age who have unclear Pap test results. After the age of 67, women should have HPV testing at the same frequency as a Pap test.   Colorectal cancer can be detected and often prevented. Most routine colorectal cancer screening begins at the age of 57 years and continues through age 26 years. However, your health care provider may recommend screening at an earlier age if you have risk factors for colon cancer. On a yearly basis, your health care provider may provide home test kits to check for hidden blood in the stool.  Use of a small camera at the end of a tube, to directly examine the colon (sigmoidoscopy or colonoscopy), can detect the earliest forms of colorectal cancer. Talk to your health care provider about this at age 23, when routine screening begins. Direct exam of the colon should be repeated every 5 -10 years through age 49 years, unless early forms of pre-cancerous polyps or small growths are found.   People who are at an increased risk for hepatitis B should be screened for this virus. You are considered at high risk for hepatitis B if:  -You were born in a country where hepatitis B occurs often. Talk with your health care provider about which countries are considered high risk.  - Your parents were born in a high-risk country and you have not received a shot to protect against hepatitis B (hepatitis B vaccine).  - You have HIV or AIDS.  - You use needles to inject street drugs.  - You live with, or have sex with, someone who has Hepatitis B.  - You get hemodialysis treatment.  - You take certain medicines for conditions like cancer, organ  transplantation, and autoimmune conditions.   Hepatitis C blood testing is recommended for all people born from [birth year] through 1965 and any individual  with known risks for hepatitis C.   Practice safe sex. Use condoms and avoid high-risk sexual practices to reduce the spread of sexually transmitted infections (STIs). STIs include gonorrhea, chlamydia, syphilis, trichomonas, herpes, HPV, and human immunodeficiency virus (HIV). Herpes, HIV, and HPV are viral illnesses that have no cure. They can result in disability, cancer, and death. Sexually active women aged 25 years and younger should be checked for chlamydia. Older women with new or multiple partners should also be tested for chlamydia. Testing for other STIs is recommended if you are sexually active and at increased risk.   Osteoporosis is a disease in which the bones lose minerals and strength with aging. This can result in serious bone fractures or breaks. The risk of osteoporosis can be identified using a bone density scan. Women ages 65 years and over and women at risk for fractures or osteoporosis should discuss screening with their health care providers. Ask your health care provider whether you should take a calcium supplement or vitamin D to There are also several preventive steps women can take to avoid osteoporosis and resulting fractures or to keep osteoporosis from worsening. -->Recommendations include:  Eat a balanced diet high in fruits, vegetables, calcium, and vitamins.  Get enough calcium. The recommended total intake of is 1,200 mg daily; for best absorption, if taking supplements, divide doses into 250-500 mg doses throughout the day. Of the two types of calcium, calcium carbonate is best absorbed when taken with food but calcium citrate can be taken on an empty stomach.  Get enough vitamin D. NAMS and the National Osteoporosis Foundation recommend at least 1,000 IU per day for women age 50 and over who are at risk of vitamin D  deficiency. Vitamin D deficiency can be caused by inadequate sun exposure (for example, those who live in northern latitudes).  Avoid alcohol and smoking. Heavy alcohol intake (more than 7 drinks per week) increases the risk of falls and hip fracture and women smokers tend to lose bone more rapidly and have lower bone mass than nonsmokers. Stopping smoking is one of the most important changes women can make to improve their health and decrease risk for disease.  Be physically active every day. Weight-bearing exercise (for example, fast walking, hiking, jogging, and weight training) may strengthen bones or slow the rate of bone loss that comes with aging. Balancing and muscle-strengthening exercises can reduce the risk of falling and fracture.  Consider therapeutic medications. Currently, several types of effective drugs are available. Healthcare providers can recommend the type most appropriate for each woman.  Eliminate environmental factors that may contribute to accidents. Falls cause nearly 90% of all osteoporotic fractures, so reducing this risk is an important bone-health strategy. Measures include ample lighting, removing obstructions to walking, using nonskid rugs on floors, and placing mats and/or grab bars in showers.  Be aware of medication side effects. Some common medicines make bones weaker. These include a type of steroid drug called glucocorticoids used for arthritis and asthma, some antiseizure drugs, certain sleeping pills, treatments for endometriosis, and some cancer drugs. An overactive thyroid gland or using too much thyroid hormone for an underactive thyroid can also be a problem. If you are taking these medicines, talk to your doctor about what you can do to help protect your bones.reduce the rate of osteoporosis.    Menopause can be associated with physical symptoms and risks. Hormone replacement therapy is available to decrease symptoms and risks. You should talk to your  health care provider   about whether hormone replacement therapy is right for you.   Use sunscreen. Apply sunscreen liberally and repeatedly throughout the day. You should seek shade when your shadow is shorter than you. Protect yourself by wearing long sleeves, pants, a wide-brimmed hat, and sunglasses year round, whenever you are outdoors.   Once a month, do a whole body skin exam, using a mirror to look at the skin on your back. Tell your health care provider of new moles, moles that have irregular borders, moles that are larger than a pencil eraser, or moles that have changed in shape or color.   -Stay current with required vaccines (immunizations).   Influenza vaccine. All adults should be immunized every year.  Tetanus, diphtheria, and acellular pertussis (Td, Tdap) vaccine. Pregnant women should receive 1 dose of Tdap vaccine during each pregnancy. The dose should be obtained regardless of the length of time since the last dose. Immunization is preferred during the 27th 36th week of gestation. An adult who has not previously received Tdap or who does not know her vaccine status should receive 1 dose of Tdap. This initial dose should be followed by tetanus and diphtheria toxoids (Td) booster doses every 10 years. Adults with an unknown or incomplete history of completing a 3-dose immunization series with Td-containing vaccines should begin or complete a primary immunization series including a Tdap dose. Adults should receive a Td booster every 10 years.  Varicella vaccine. An adult without evidence of immunity to varicella should receive 2 doses or a second dose if she has previously received 1 dose. Pregnant females who do not have evidence of immunity should receive the first dose after pregnancy. This first dose should be obtained before leaving the health care facility. The second dose should be obtained 4 8 weeks after the first dose.  Human papillomavirus (HPV) vaccine. Females aged 13 26  years who have not received the vaccine previously should obtain the 3-dose series. The vaccine is not recommended for use in pregnant females. However, pregnancy testing is not needed before receiving a dose. If a female is found to be pregnant after receiving a dose, no treatment is needed. In that case, the remaining doses should be delayed until after the pregnancy. Immunization is recommended for any person with an immunocompromised condition through the age of 26 years if she did not get any or all doses earlier. During the 3-dose series, the second dose should be obtained 4 8 weeks after the first dose. The third dose should be obtained 24 weeks after the first dose and 16 weeks after the second dose.  Zoster vaccine. One dose is recommended for adults aged 60 years or older unless certain conditions are present.  Measles, mumps, and rubella (MMR) vaccine. Adults born before 1957 generally are considered immune to measles and mumps. Adults born in 1957 or later should have 1 or more doses of MMR vaccine unless there is a contraindication to the vaccine or there is laboratory evidence of immunity to each of the three diseases. A routine second dose of MMR vaccine should be obtained at least 28 days after the first dose for students attending postsecondary schools, health care workers, or international travelers. People who received inactivated measles vaccine or an unknown type of measles vaccine during 1963 1967 should receive 2 doses of MMR vaccine. People who received inactivated mumps vaccine or an unknown type of mumps vaccine before 1979 and are at high risk for mumps infection should consider immunization with 2 doses of   MMR vaccine. For females of childbearing age, rubella immunity should be determined. If there is no evidence of immunity, females who are not pregnant should be vaccinated. If there is no evidence of immunity, females who are pregnant should delay immunization until after pregnancy.  Unvaccinated health care workers born before [birth year] who lack laboratory evidence of measles, mumps, or rubella immunity or laboratory confirmation of disease should consider measles and mumps immunization with 2 doses of MMR vaccine or rubella immunization with 1 dose of MMR vaccine.  Pneumococcal 13-valent conjugate (PCV13) vaccine. When indicated, a person who is uncertain of her immunization history and has no record of immunization should receive the PCV13 vaccine. An adult aged 54 years or older who has certain medical conditions and has not been previously immunized should receive 1 dose of PCV13 vaccine. This PCV13 should be followed with a dose of pneumococcal polysaccharide (PPSV23) vaccine. The PPSV23 vaccine dose should be obtained at least 8 weeks after the dose of PCV13 vaccine. An adult aged 58 years or older who has certain medical conditions and previously received 1 or more doses of PPSV23 vaccine should receive 1 dose of PCV13. The PCV13 vaccine dose should be obtained 1 or more years after the last PPSV23 vaccine dose.  Pneumococcal polysaccharide (PPSV23) vaccine. When PCV13 is also indicated, PCV13 should be obtained first. All adults aged 58 years and older should be immunized. An adult younger than age 65 years who has certain medical conditions should be immunized. Any person who resides in a nursing home or long-term care facility should be immunized. An adult smoker should be immunized. People with an immunocompromised condition and certain other conditions should receive both PCV13 and PPSV23 vaccines. People with human immunodeficiency virus (HIV) infection should be immunized as soon as possible after diagnosis. Immunization during chemotherapy or radiation therapy should be avoided. Routine use of PPSV23 vaccine is not recommended for American Indians, [place] Natives, or people younger than 65 years unless there are medical conditions that require PPSV23 vaccine. When indicated,  people who have unknown immunization and have no record of immunization should receive PPSV23 vaccine. One-time revaccination 5 years after the first dose of PPSV23 is recommended for people aged 70 64 years who have chronic kidney failure, nephrotic syndrome, asplenia, or immunocompromised conditions. People who received 1 2 doses of PPSV23 before age 32 years should receive another dose of PPSV23 vaccine at age 96 years or later if at least 5 years have passed since the previous dose. Doses of PPSV23 are not needed for people immunized with PPSV23 at or after age 55 years.  Meningococcal vaccine. Adults with asplenia or persistent complement component deficiencies should receive 2 doses of quadrivalent meningococcal conjugate (MenACWY-D) vaccine. The doses should be obtained at least 2 months apart. Microbiologists working with certain meningococcal bacteria, [employer] recruits, people at risk during an outbreak, and people who travel to or live in countries with a high rate of meningitis should be immunized. A first-year college student up through age 58 years who is living in a residence hall should receive a dose if she did not receive a dose on or after her 16th birthday. Adults who have certain high-risk conditions should receive one or more doses of vaccine.  Hepatitis A vaccine. Adults who wish to be protected from this disease, have certain high-risk conditions, work with hepatitis A-infected animals, work in hepatitis A research labs, or travel to or work in countries with a high rate of hepatitis A should be  immunized. Adults who were previously unvaccinated and who anticipate close contact with an international adoptee during the first 60 days after arrival in the [place] from a country with a high rate of hepatitis A should be immunized.  Hepatitis B vaccine.  Adults who wish to be protected from this disease, have certain high-risk conditions, may be exposed to blood or other infectious  body fluids, are household contacts or sex partners of hepatitis B positive people, are clients or workers in certain care facilities, or travel to or work in countries with a high rate of hepatitis B should be immunized.  Haemophilus influenzae type b (Hib) vaccine. A previously unvaccinated person with asplenia or sickle cell disease or having a scheduled splenectomy should receive 1 dose of Hib vaccine. Regardless of previous immunization, a recipient of a hematopoietic stem cell transplant should receive a 3-dose series 6 12 months after her successful transplant. Hib vaccine is not recommended for adults with HIV infection.  Preventive Services / Frequency Ages 6 to 39years  Blood pressure check.** / Every 1 to 2 years.  Lipid and cholesterol check.** / Every 5 years beginning at age 39.  Clinical breast exam.** / Every 3 years for women in their 61s and 62s.  BRCA-related cancer risk assessment.** / For women who have family members with a BRCA-related cancer (breast, ovarian, tubal, or peritoneal cancers).  Pap test.** / Every 2 years from ages 47 through 85. Every 3 years starting at age 34 through age 12 or 74 with a history of 3 consecutive normal Pap tests.  HPV screening.** / Every 3 years from ages 46 through ages 43 to 54 with a history of 3 consecutive normal Pap tests.  Hepatitis C blood test.** / For any individual with known risks for hepatitis C.  Skin self-exam. / Monthly.  Influenza vaccine. / Every year.  Tetanus, diphtheria, and acellular pertussis (Tdap, Td) vaccine.** / Consult your health care provider. Pregnant women should receive 1 dose of Tdap vaccine during each pregnancy. 1 dose of Td every 10 years.  Varicella vaccine.** / Consult your health care provider. Pregnant females who do not have evidence of immunity should receive the first dose after pregnancy.  HPV vaccine. / 3 doses over 6 months, if 64 and younger. The vaccine is not recommended for use in  pregnant females. However, pregnancy testing is not needed before receiving a dose.  Measles, mumps, rubella (MMR) vaccine.** / You need at least 1 dose of MMR if you were born in 1957 or later. You may also need a 2nd dose. For females of childbearing age, rubella immunity should be determined. If there is no evidence of immunity, females who are not pregnant should be vaccinated. If there is no evidence of immunity, females who are pregnant should delay immunization until after pregnancy.  Pneumococcal 13-valent conjugate (PCV13) vaccine.** / Consult your health care provider.  Pneumococcal polysaccharide (PPSV23) vaccine.** / 1 to 2 doses if you smoke cigarettes or if you have certain conditions.  Meningococcal vaccine.** / 1 dose if you are age 71 to 37 years and a Market researcher living in a residence hall, or have one of several medical conditions, you need to get vaccinated against meningococcal disease. You may also need additional booster doses.  Hepatitis A vaccine.** / Consult your health care provider.  Hepatitis B vaccine.** / Consult your health care provider.  Haemophilus influenzae type b (Hib) vaccine.** / Consult your health care provider.  Ages 55 to 64years  Blood pressure check.** / Every 1 to 2 years.  Lipid and cholesterol check.** / Every 5 years beginning at age 20 years.  Lung cancer screening. / Every year if you are aged 55 80 years and have a 30-pack-year history of smoking and currently smoke or have quit within the past 15 years. Yearly screening is stopped once you have quit smoking for at least 15 years or develop a health problem that would prevent you from having lung cancer treatment.  Clinical breast exam.** / Every year after age 40 years.  BRCA-related cancer risk assessment.** / For women who have family members with a BRCA-related cancer (breast, ovarian, tubal, or peritoneal cancers).  Mammogram.** / Every year beginning at age 40  years and continuing for as long as you are in good health. Consult with your health care provider.  Pap test.** / Every 3 years starting at age 30 years through age 65 or 70 years with a history of 3 consecutive normal Pap tests.  HPV screening.** / Every 3 years from ages 30 years through ages 65 to 70 years with a history of 3 consecutive normal Pap tests.  Fecal occult blood test (FOBT) of stool. / Every year beginning at age 50 years and continuing until age 75 years. You may not need to do this test if you get a colonoscopy every 10 years.  Flexible sigmoidoscopy or colonoscopy.** / Every 5 years for a flexible sigmoidoscopy or every 10 years for a colonoscopy beginning at age 50 years and continuing until age 75 years.  Hepatitis C blood test.** / For all people born from 1945 through 1965 and any individual with known risks for hepatitis C.  Skin self-exam. / Monthly.  Influenza vaccine. / Every year.  Tetanus, diphtheria, and acellular pertussis (Tdap/Td) vaccine.** / Consult your health care provider. Pregnant women should receive 1 dose of Tdap vaccine during each pregnancy. 1 dose of Td every 10 years.  Varicella vaccine.** / Consult your health care provider. Pregnant females who do not have evidence of immunity should receive the first dose after pregnancy.  Zoster vaccine.** / 1 dose for adults aged 60 years or older.  Measles, mumps, rubella (MMR) vaccine.** / You need at least 1 dose of MMR if you were born in 1957 or later. You may also need a 2nd dose. For females of childbearing age, rubella immunity should be determined. If there is no evidence of immunity, females who are not pregnant should be vaccinated. If there is no evidence of immunity, females who are pregnant should delay immunization until after pregnancy.  Pneumococcal 13-valent conjugate (PCV13) vaccine.** / Consult your health care provider.  Pneumococcal polysaccharide (PPSV23) vaccine.** / 1 to 2 doses if  you smoke cigarettes or if you have certain conditions.  Meningococcal vaccine.** / Consult your health care provider.  Hepatitis A vaccine.** / Consult your health care provider.  Hepatitis B vaccine.** / Consult your health care provider.  Haemophilus influenzae type b (Hib) vaccine.** / Consult your health care provider.  Ages 65 years and over  Blood pressure check.** / Every 1 to 2 years.  Lipid and cholesterol check.** / Every 5 years beginning at age 20 years.  Lung cancer screening. / Every year if you are aged 55 80 years and have a 30-pack-year history of smoking and currently smoke or have quit within the past 15 years. Yearly screening is stopped once you have quit smoking for at least 15 years or develop a health problem that   would prevent you from having lung cancer treatment.  Clinical breast exam.** / Every year after age 103 years.  BRCA-related cancer risk assessment.** / For women who have family members with a BRCA-related cancer (breast, ovarian, tubal, or peritoneal cancers).  Mammogram.** / Every year beginning at age 36 years and continuing for as long as you are in good health. Consult with your health care provider.  Pap test.** / Every 3 years starting at age 5 years through age 85 or 10 years with 3 consecutive normal Pap tests. Testing can be stopped between 65 and 70 years with 3 consecutive normal Pap tests and no abnormal Pap or HPV tests in the past 10 years.  HPV screening.** / Every 3 years from ages 93 years through ages 70 or 45 years with a history of 3 consecutive normal Pap tests. Testing can be stopped between 65 and 70 years with 3 consecutive normal Pap tests and no abnormal Pap or HPV tests in the past 10 years.  Fecal occult blood test (FOBT) of stool. / Every year beginning at age 8 years and continuing until age 45 years. You may not need to do this test if you get a colonoscopy every 10 years.  Flexible sigmoidoscopy or colonoscopy.** /  Every 5 years for a flexible sigmoidoscopy or every 10 years for a colonoscopy beginning at age 69 years and continuing until age 68 years.  Hepatitis C blood test.** / For all people born from [birth year] through 1965 and any individual with known risks for hepatitis C.  Osteoporosis screening.** / A one-time screening for women ages 7 years and over and women at risk for fractures or osteoporosis.  Skin self-exam. / Monthly.  Influenza vaccine. / Every year.  Tetanus, diphtheria, and acellular pertussis (Tdap/Td) vaccine.** / 1 dose of Td every 10 years.  Varicella vaccine.** / Consult your health care provider.  Zoster vaccine.** / 1 dose for adults aged 5 years or older.  Pneumococcal 13-valent conjugate (PCV13) vaccine.** / Consult your health care provider.  Pneumococcal polysaccharide (PPSV23) vaccine.** / 1 dose for all adults aged 74 years and older.  Meningococcal vaccine.** / Consult your health care provider.  Hepatitis A vaccine.** / Consult your health care provider.  Hepatitis B vaccine.** / Consult your health care provider.  Haemophilus influenzae type b (Hib) vaccine.** / Consult your health care provider. ** Family history and personal history of risk and conditions may change your health care provider's recommendations. Document Released: 04/03/2001 Document Revised: 11/26/2012  [facility] Patient Information 2014 [place], [place].   EXERCISE AND DIET:  We recommended that you start or continue a regular exercise program for good health. Regular exercise means any activity that makes your heart beat faster and makes you sweat.  We recommend exercising at least 30 minutes per day at least 3 days a week, preferably 5.  We also recommend a diet low in fat and sugar / carbohydrates.  Inactivity, poor dietary choices and obesity can cause diabetes, heart attack, stroke, and kidney damage, among others.     ALCOHOL AND SMOKING:  Women should limit their alcohol intake to no  more than 7 drinks/beers/glasses of wine (combined, not each!) per week. Moderation of alcohol intake to this level decreases your risk of breast cancer and liver damage.  ( And of course, no recreational drugs are part of a healthy lifestyle.)  Also, you should not be smoking at all or even being exposed to second hand smoke. Most people know smoking can  cause cancer, and various heart and lung diseases, but did you know it also contributes to weakening of your bones?  Aging of your skin?  Yellowing of your teeth and nails?   CALCIUM AND VITAMIN D:  Adequate intake of calcium and Vitamin D are recommended.  The recommendations for exact amounts of these supplements seem to change often, but generally speaking 600 mg of calcium (either carbonate or citrate) and 800 units of Vitamin D per day seems prudent. Certain women may benefit from higher intake of Vitamin D.  If you are among these women, your doctor will have told you during your visit.     PAP SMEARS:  Pap smears, to check for cervical cancer or precancers,  have traditionally been done yearly, although recent scientific advances have shown that most women can have pap smears less often.  However, every woman still should have a physical exam from her gynecologist or primary care physician every year. It will include a breast check, inspection of the vulva and vagina to check for abnormal growths or skin changes, a visual exam of the cervix, and then an exam to evaluate the size and shape of the uterus and ovaries.  And after 53 years of age, a rectal exam is indicated to check for rectal cancers. We will also provide age appropriate advice regarding health maintenance, like when you should have certain vaccines, screening for sexually transmitted diseases, bone density testing, colonoscopy, mammograms, etc.    MAMMOGRAMS:  All women over 31 years old should have a yearly mammogram. Many facilities now offer a "3D" mammogram, which may cost  around $50 extra out of pocket. If possible,  we recommend you accept the option to have the 3D mammogram performed.  It both reduces the number of women who will be called back for extra views which then turn out to be normal, and it is better than the routine mammogram at detecting truly abnormal areas.     COLONOSCOPY:  Colonoscopy to screen for colon cancer is recommended for all women at age 25.  We know, you hate the idea of the prep.  We agree, BUT, having colon cancer and not knowing it is worse!!  Colon cancer so often starts as a polyp that can be seen and removed at colonscopy, which can quite literally save your life!  And if your first colonoscopy is normal and you have no family history of colon cancer, most women don't have to have it again for 10 years.  Once every ten years, you can do something that may end up saving your life, right?  We will be happy to help you get it scheduled when you are ready.  Be sure to check your insurance coverage so you understand how much it will cost.  It may be covered as a preventative service at no cost, but you should check your particular policy.    Continue all medications as directed. Increase water intake, strive for at least 100 ounces/day.   Follow Heart Healthy diet. Increase regular exercise.  Recommend at least 30 minutes daily, 5 days per week of walking, biking, swimming, YouTube/Pinterest workout videos. We will contact you when your lab results are available. Continue to social distance and wear a mask when in public. Recommend annual physical with fasting labs the week prior. Continue to social distance and wear a mask when in public. GREAT TO SEE YOU!

## 2019-01-01 NOTE — Progress Notes (Signed)
Subjective:    Patient ID: Janet Douglas, female    DOB: 1965/07/11, 53 y.o.   MRN: [id number]  HPI:  05/14/2018 OV: Janet Douglas called in today to establish as a new pt. [id number], Hypothyroidism,elevated BP without dx of HTN, fatigue, L foot pain (since dec 2019), posterior cervical neck pain (since Dec 2019- worsens throughout the day, described as dull ache, rated 8/10). Hx of abenignbrain tumor- VP shunt placed 2006 byDr. Christella Noa is her neurosurgeon- has not been seen by this specialist in years. 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 Current wt 200- she reports diet high in saturated fat/CHO She denies any regular exercise She denies tobacco/vape use She reports 1 cocktail/week He father passed away from MI at age 82- heavy tobacco use Her mother has severe dementia/Alzheimers- mother is 58 years old, she estimates that mental decline started at in late 36s She has been seen by Murphy/Wainer in past for chronic hip pain, never been seen for the cervical neck pain  08/11/2018 OV: Janet Douglas presents for 3 month f/u: Obesity, Thyroid Disease, GAD She reports excessive worry with HAs, difficulty concentrating, and muscle tension worsening over the last 12 months. She reports previously being on Sertraline to treat anxiety and would like to re-start SSRI. 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  She states "every since [place] has been on the seen, there are so many packages and they are just getting heavier and heavier". She has been working with Orthopedic specialist for bil knee pain and most recently R shoulder pain. She new complaint of cervical neck pain that has progressively worsened since Dec 2019. Benignbrain tumor- VP shunt placed 2006 byDr. Christella Noa, last contact with  neurosurgeon was years ago. She feels that her neck pain is r/t to that surgery, since surgical approach was posterior neck. She has never been referred to PT for eval/tx- she is agreeable to referral. She reports chronic imbalance and memory issues since the 2006 procedure, again she has not been followed by Neurology/Neurosurgery in years. She denies known hx of diabetes or elevated blood sugar  01/01/2019 OV: Janet Douglas is here for CPE She reports stable mood, denies SI/HI- currently on Sertraline 50mg  QD She remains active with 10-12 hr shifts at [employer] She denies "formal exercise" outside of work. Recently received R should injection- likely will have surgical intervention Dec 2020.  11/11/2018 Labs: TSH-low-0.166-  Levothyroxine reduced from 111mcg to 112 mcg   TSH re-checked today- she denies increase in fatigue, wt stable, up one lb since last OV Body mass index is 32.84 kg/m.  Healthcare Maintenance: PAP-tracking down- completed with Dr. Marvel Plan Mammogram-tracking down-completed with Dr. Marvel Plan Colonoscopy-12/24/2016, repeat in 10 years  Immunizations-UTD Patient Care Team    Relationship Specialty Notifications Start End  Esaw Grandchild, NP PCP - General Family Medicine  05/13/18   Elsie Saas, [place] Physician Orthopedic Surgery  05/14/18   Ashok Pall, MD Consulting Physician Neurosurgery  05/14/18   Paula Compton, MD Consulting Physician Obstetrics and Gynecology  05/14/18     Patient Active Problem List   Diagnosis Date Noted  . Toe pain 09/16/2018  . GAD (generalized anxiety disorder) 08/11/2018  . Healthcare maintenance 05/14/2018  . Peroneal tendinitis 05/20/2017  . Drooping eyelid, bilateral 12/25/2016  . Bloating 10/24/2016  .  Colon cancer screening 10/24/2016  . Allergic eye reaction 02/17/2016  . Vision changes 08/31/2015  . Right shoulder pain 08/31/2015  . Perimenopausal 08/31/2015  . Nevus 08/31/2015  . Hyperglycemia 08/31/2014   . Fatigue 08/31/2014  . Perimenopause 08/31/2014  . Screening for lipoid disorders 08/31/2014  . Cough 01/14/2013  . History of brain tumor 11/28/2012  . Skin lesion of face 04/14/2012  . Obesity 06/05/2011  . Allergic rhinitis 05/18/2010  . DEPRESSION, SITUATIONAL 06/15/2009  . ELEVATED BLOOD PRESSURE WITHOUT DIAGNOSIS OF HYPERTENSION 06/15/2009  . PLANTAR FASCIITIS 05/20/2008  . METATARSALGIA 05/06/2008  . OTHER ENTHESOPATHY OF ANKLE AND TARSUS 05/06/2008  . SESAMOIDITIS 11/04/2007  . NONORGANIC SLEEP DISORDER UNSPECIFIED 04/24/2007  . POOR CONCENTRATION 09/10/2006  . Hypothyroidism 09/09/2006  . Anemia 09/09/2006  . Cervical pain (neck) 09/09/2006  . TRANSAMINASES, SERUM, ELEVATED 09/09/2006  . ABNORMAL PAP SMEAR 09/09/2006     Past Medical History:  Diagnosis Date  . Anemia    NOS  . Brain tumor ([place])    S/P resection with shunt  . Hypothyroidism   . Torn rotator cuff      Past Surgical History:  Procedure Laterality Date  . Abdominal US  05/19/1999   Negative  . Abdominal US  08/10   Normal  . 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 TUBAL LIGATION       Family History  Problem Relation Age of Onset  . Thyroid disease Mother   . Heart attack Father   . Hypertension Father   . Thyroid disease Sister   . Heart disease Sister        Low heart rate  . Hypertension Sister   . Thyroid disease Brother   . Hypertension Brother   . Thyroid disease Brother   . Colon cancer Neg Hx      Social History   Substance and Sexual Activity  Drug Use No     Social History   Substance and Sexual Activity  Alcohol Use Yes  . Alcohol/week: 0.0 standard drinks   Comment: Minimal- 1/week     Social History   Tobacco Use  Smoking Status Never Smoker  Smokeless Tobacco Never Used     Outpatient Encounter Medications as of 01/01/2019  Medication Sig  . acetaminophen (TYLENOL) 325 MG tablet Take 650 mg by mouth every 6 (six) hours as needed.   Marland Kitchen levothyroxine (SYNTHROID) 112 MCG tablet Take 1 tablet (112 mcg total) by mouth daily.  . meloxicam (MOBIC) 15 MG tablet Take 15 mg by mouth as needed for pain.  Marland Kitchen sertraline (ZOLOFT) 50 MG tablet Take 1 tablet (50 mg total) by mouth daily.  . [DISCONTINUED] sertraline (ZOLOFT) 50 MG tablet Take 1 tablet (50 mg total) by mouth daily.  . [DISCONTINUED] tiZANidine (ZANAFLEX) 2 MG tablet Take 1 tablet by mouth 2 (two) times a day.   No facility-administered encounter medications on file as of 01/01/2019.     Allergies: Bupropion hcl  Body mass index is 32.84 kg/m.  Blood pressure 103/69, pulse 73, temperature 98.6 F (37 C), temperature source Oral, height 5' 5.5" (1.664 m), weight 200 lb 6.4 oz (90.9 kg), SpO2 96 %.   Review of Systems  Constitutional: Negative for activity change, appetite change, chills, diaphoresis, fatigue, fever and unexpected weight change.  Eyes: Negative for visual disturbance.  Respiratory: Negative for cough, chest tightness, shortness of breath and wheezing.   Cardiovascular: Negative for chest pain and palpitations.  Gastrointestinal: Negative for abdominal distention, abdominal  pain, blood in stool, constipation, diarrhea, nausea and vomiting.  Endocrine: Negative for polydipsia, polyphagia and polyuria.  Genitourinary: Negative for difficulty urinating and flank pain.  Musculoskeletal: Positive for arthralgias and myalgias.  Neurological: Negative for dizziness and headaches.  Hematological: Negative for adenopathy. Does not bruise/bleed easily.  Psychiatric/Behavioral: Negative for agitation, behavioral problems, confusion, decreased concentration, dysphoric mood, hallucinations, self-injury, sleep disturbance and suicidal ideas. The patient is not nervous/anxious and is not hyperactive.        Objective:   Physical Exam Vitals signs and nursing note reviewed.  Constitutional:      General: She is not in acute distress.    Appearance: She is  obese. She is not ill-appearing, toxic-appearing or diaphoretic.  HENT:     Head: Normocephalic and atraumatic.     Right Ear: Tympanic membrane, ear canal and external ear normal. There is no impacted cerumen.     Left Ear: Tympanic membrane, ear canal and external ear normal. There is no impacted cerumen.     Nose: Nose normal. No congestion.     Mouth/Throat:     Mouth: Mucous membranes are moist.     Pharynx: No oropharyngeal exudate.  Eyes:     Extraocular Movements: Extraocular movements intact.     Conjunctiva/sclera: Conjunctivae normal.     Pupils: Pupils are equal, round, and reactive to light.  Neck:     Musculoskeletal: Normal range of motion and neck supple.  Cardiovascular:     Rate and Rhythm: Normal rate and regular rhythm.     Pulses: Normal pulses.     Heart sounds: Normal heart sounds. No murmur. No friction rub. No gallop.   Pulmonary:     Effort: Pulmonary effort is normal. No respiratory distress.     Breath sounds: Normal breath sounds. No stridor. No wheezing, rhonchi or rales.  Chest:     Chest wall: No tenderness.  Abdominal:     General: Abdomen is protuberant. Bowel sounds are normal.     Palpations: Abdomen is soft.     Tenderness: There is no right CVA tenderness or left CVA tenderness.  Musculoskeletal:        General: No swelling.  Skin:    General: Skin is warm and dry.     Capillary Refill: Capillary refill takes less than 2 seconds.  Neurological:     Mental Status: She is alert and oriented to person, place, and time.     Coordination: Coordination normal.  Psychiatric:        Attention and Perception: Attention and perception normal.        Mood and Affect: Mood normal.        Speech: Speech normal.        Behavior: Behavior normal.        Thought Content: Thought content normal.        Cognition and Memory: Memory is impaired.        Judgment: Judgment normal.       Assessment & Plan:   1. Hypothyroidism, unspecified type   2.  Healthcare maintenance   3. Hyperglycemia   4. History of brain tumor     Healthcare maintenance Continue all medications as directed. Increase water intake, strive for at least 100 ounces/day.   Follow Heart Healthy diet. Increase regular exercise.  Recommend at least 30 minutes daily, 5 days per week of walking, biking, swimming, YouTube/Pinterest workout videos. We will contact you when your lab results are available. Continue to social distance  and wear a mask when in public. Recommend annual physical with fasting labs the week prior. Continue to social distance and wear a mask when in public.  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

## 2019-01-01 NOTE — Assessment & Plan Note (Addendum)
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

## 2019-01-01 NOTE — Assessment & Plan Note (Signed)
Continue all medications as directed. Increase water intake, strive for at least 100 ounces/day.   Follow Heart Healthy diet. Increase regular exercise.  Recommend at least 30 minutes daily, 5 days per week of walking, biking, swimming, YouTube/Pinterest workout videos. We will contact you when your lab results are available. Continue to social distance and wear a mask when in public. Recommend annual physical with fasting labs the week prior. Continue to social distance and wear a mask when in public.

## 2019-01-01 NOTE — Assessment & Plan Note (Signed)
Lab Results  Component Value Date   HGBA1C 5.6 11/03/2018   HGBA1C 5.7 10/28/2017   HGBA1C 5.8 10/24/2016

## 2019-01-01 NOTE — Assessment & Plan Note (Signed)
11/11/2018 Labs: TSH-low-0.166-  Levothyroxine reduced from 1107mcg to 112 mcg   TSH re-checked today

## 2019-01-02 ENCOUNTER — Other Ambulatory Visit: Payer: Self-pay | Admitting: Adult Health

## 2019-01-02 ENCOUNTER — Encounter: Payer: Self-pay | Admitting: Adult Health

## 2019-01-02 DIAGNOSIS — E039 Hypothyroidism, unspecified: Secondary | ICD-10-CM

## 2019-01-02 LAB — TSH: TSH: 0.336 u[IU]/mL — ABNORMAL LOW (ref 0.450–4.500)

## 2019-01-02 MED ORDER — LEVOTHYROXINE SODIUM 112 MCG PO TABS: 112.0000 ug | ORAL_TABLET | Freq: Every day | ORAL | 0 refills | Status: DC

## 2019-01-09 ENCOUNTER — Encounter: Payer: Self-pay | Admitting: Adult Health

## 2019-01-09 ENCOUNTER — Other Ambulatory Visit: Payer: Self-pay | Admitting: Adult Health

## 2019-01-09 MED ORDER — SERTRALINE HCL 50 MG PO TABS: 50.0000 mg | ORAL_TABLET | Freq: Every day | ORAL | 0 refills | Status: DC

## 2019-01-19 NOTE — Progress Notes (Signed)
Received Epic notification that pt has not read MyChart message regarding results.    Thyroid Level  From  Janet Grandchild, Janet Douglas To  Janet Douglas Sent and Delivered  01/02/2019 8:45 AM  Good Morning Janet Douglas-  You Thyroid level has increased, however still just below the normal range (0.450-4.5)  Your level is 0.336, improved from 0.166  I would recommend continuing the same Levothyroxine dosage and re-checking the level in 6 weeks.  If still low, then will adjust the dosage.  I just in refill on Levothyroxine to [facility].  Please call [phone] to schedule a non-fasting lab appt in 6 weeks.  Sincerely,  Janet Douglas User Last Read On  Janet Douglas Not Read        Letter mailed to pt with results.  Janet Douglas, CMA

## 2019-02-17 ENCOUNTER — Other Ambulatory Visit: Payer: Self-pay | Admitting: Adult Health

## 2019-02-17 MED ORDER — LEVOTHYROXINE SODIUM 112 MCG PO TABS: 112.0000 ug | ORAL_TABLET | Freq: Every day | ORAL | 0 refills | Status: DC

## 2019-02-27 ENCOUNTER — Other Ambulatory Visit: Payer: Self-pay

## 2019-02-27 ENCOUNTER — Other Ambulatory Visit: Payer: 59

## 2019-02-27 DIAGNOSIS — E039 Hypothyroidism, unspecified: Secondary | ICD-10-CM

## 2019-02-28 ENCOUNTER — Encounter: Payer: Self-pay | Admitting: Adult Health

## 2019-02-28 ENCOUNTER — Other Ambulatory Visit: Payer: Self-pay | Admitting: Adult Health

## 2019-02-28 LAB — TSH: TSH: 0.6 u[IU]/mL (ref 0.450–4.500)

## 2019-02-28 MED ORDER — LEVOTHYROXINE SODIUM 112 MCG PO TABS: 112.0000 ug | ORAL_TABLET | Freq: Every day | ORAL | 0 refills | Status: DC

## 2019-03-21 ENCOUNTER — Other Ambulatory Visit: Payer: Self-pay | Admitting: Adult Health

## 2019-05-18 ENCOUNTER — Other Ambulatory Visit: Payer: Self-pay | Admitting: Adult Health

## 2019-05-29 ENCOUNTER — Telehealth: Payer: Self-pay

## 2019-05-29 ENCOUNTER — Other Ambulatory Visit: Payer: Self-pay | Admitting: Adult Health

## 2019-05-29 NOTE — Telephone Encounter (Signed)
Please call pt to schedule appt for nonfasting TSH.  No further refills until pt has this lab done.  Charyl Bigger, CMA

## 2019-06-04 ENCOUNTER — Telehealth: Payer: Self-pay | Admitting: Adult Health

## 2019-06-04 NOTE — Telephone Encounter (Signed)
4/15 --Left pt message to contact office for Non- Fasting Lab Appt   Please call pt to schedule appt for nonfasting TSH.  No further refills until pt has this lab done.  Charyl Bigger, CMA  FYI to med asst.  --glh

## 2019-06-18 ENCOUNTER — Other Ambulatory Visit: Payer: Self-pay | Admitting: Adult Health

## 2019-06-24 ENCOUNTER — Other Ambulatory Visit: Payer: Self-pay | Admitting: Physician Assistant

## 2019-06-24 DIAGNOSIS — E039 Hypothyroidism, unspecified: Secondary | ICD-10-CM

## 2019-06-24 NOTE — Progress Notes (Signed)
   05/29/19 9:34 AM Note   Please call pt to schedule appt for nonfasting TSH.  No further refills until pt has this lab done.  Charyl Bigger, CMA

## 2019-06-25 ENCOUNTER — Other Ambulatory Visit: Payer: Self-pay

## 2019-06-25 ENCOUNTER — Other Ambulatory Visit: Payer: 59

## 2019-06-25 DIAGNOSIS — E039 Hypothyroidism, unspecified: Secondary | ICD-10-CM

## 2019-06-26 ENCOUNTER — Other Ambulatory Visit: Payer: Self-pay | Admitting: Physician Assistant

## 2019-06-26 DIAGNOSIS — E039 Hypothyroidism, unspecified: Secondary | ICD-10-CM

## 2019-06-26 LAB — TSH: TSH: 0.577 u[IU]/mL (ref 0.450–4.500)

## 2019-06-29 MED ORDER — LEVOTHYROXINE SODIUM 112 MCG PO TABS: 112.0000 ug | ORAL_TABLET | Freq: Every day | ORAL | 1 refills | Status: DC

## 2019-06-29 NOTE — Telephone Encounter (Signed)
-----   Message from Lorrene Reid, [place] sent at 06/26/2019  1:17 PM EDT ----- Jefm Bryant,  Please call patient and ask where she would like me to send her refills for levothyroxine. She uses mail order but don't know if she is out of her medication. Last refill was on 05/28/29 for 30 tablets only because pt needed to come in for labs.  Thank you, Herb Grays

## 2019-06-29 NOTE — Telephone Encounter (Signed)
Med sent to [facility] per patient request. AS, CMA

## 2019-07-21 ENCOUNTER — Encounter: Payer: Self-pay | Admitting: Physician Assistant

## 2019-07-21 ENCOUNTER — Other Ambulatory Visit: Payer: Self-pay | Admitting: Adult Health

## 2019-07-21 MED ORDER — SERTRALINE HCL 50 MG PO TABS: 50.0000 mg | ORAL_TABLET | Freq: Every day | ORAL | 0 refills | Status: DC

## 2019-08-27 ENCOUNTER — Ambulatory Visit: Payer: 59 | Admitting: Podiatry

## 2019-08-28 ENCOUNTER — Other Ambulatory Visit: Payer: Self-pay | Admitting: Physician Assistant

## 2019-09-13 ENCOUNTER — Other Ambulatory Visit: Payer: Self-pay | Admitting: Physician Assistant

## 2019-09-22 ENCOUNTER — Telehealth: Payer: Self-pay | Admitting: Physician Assistant

## 2019-09-22 NOTE — Telephone Encounter (Signed)
Please call patient to schedule apt for further refills of Zoloft and other meds. Patient last seen 12/2018 and was advised then to follow up for CPE. AS< CMA

## 2019-10-01 ENCOUNTER — Other Ambulatory Visit: Payer: Self-pay | Admitting: Orthopedic Surgery

## 2019-10-06 ENCOUNTER — Other Ambulatory Visit: Payer: Self-pay | Admitting: Orthopedic Surgery

## 2019-10-14 ENCOUNTER — Other Ambulatory Visit: Payer: Self-pay | Admitting: Orthopedic Surgery

## 2019-10-14 DIAGNOSIS — M75101 Unspecified rotator cuff tear or rupture of right shoulder, not specified as traumatic: Secondary | ICD-10-CM

## 2019-10-24 ENCOUNTER — Other Ambulatory Visit: Payer: Self-pay | Admitting: Physician Assistant

## 2019-10-30 ENCOUNTER — Other Ambulatory Visit: Payer: Self-pay | Admitting: Physician Assistant

## 2019-10-30 DIAGNOSIS — E039 Hypothyroidism, unspecified: Secondary | ICD-10-CM

## 2019-11-09 ENCOUNTER — Other Ambulatory Visit: Payer: Self-pay | Admitting: Physician Assistant

## 2019-11-11 ENCOUNTER — Other Ambulatory Visit: Payer: Self-pay

## 2019-12-01 ENCOUNTER — Other Ambulatory Visit: Payer: 59

## 2019-12-15 ENCOUNTER — Other Ambulatory Visit: Payer: 59

## 2019-12-20 ENCOUNTER — Other Ambulatory Visit: Payer: Self-pay | Admitting: Physician Assistant

## 2019-12-21 MED ORDER — SERTRALINE HCL 50 MG PO TABS
50.0000 mg | ORAL_TABLET | Freq: Every day | ORAL | 0 refills | Status: DC
Start: 2019-12-21 — End: 2020-01-05

## 2019-12-28 ENCOUNTER — Other Ambulatory Visit: Payer: Self-pay

## 2019-12-28 ENCOUNTER — Other Ambulatory Visit: Payer: 59

## 2019-12-28 DIAGNOSIS — E039 Hypothyroidism, unspecified: Secondary | ICD-10-CM

## 2019-12-28 DIAGNOSIS — Z0000 Encounter for general adult medical examination without abnormal findings: Secondary | ICD-10-CM

## 2019-12-28 DIAGNOSIS — D509 Iron deficiency anemia, unspecified: Secondary | ICD-10-CM

## 2019-12-28 DIAGNOSIS — R739 Hyperglycemia, unspecified: Secondary | ICD-10-CM

## 2019-12-29 LAB — COMPREHENSIVE METABOLIC PANEL
ALT: 16 IU/L (ref 0–32)
AST: 16 IU/L (ref 0–40)
Albumin/Globulin Ratio: 1.7 (ref 1.2–2.2)
Albumin: 4.5 g/dL (ref 3.8–4.9)
Alkaline Phosphatase: 86 IU/L (ref 44–121)
BUN/Creatinine Ratio: 22 (ref 9–23)
BUN: 20 mg/dL (ref 6–24)
Bilirubin Total: 0.5 mg/dL (ref 0.0–1.2)
CO2: 22 mmol/L (ref 20–29)
Calcium: 9.6 mg/dL (ref 8.7–10.2)
Chloride: 103 mmol/L (ref 96–106)
Creatinine, Ser: 0.9 mg/dL (ref 0.57–1.00)
GFR calc Af Amer: 84 mL/min/{1.73_m2} (ref >59)
GFR calc non Af Amer: 73 mL/min/{1.73_m2} (ref >59)
Globulin, Total: 2.6 g/dL (ref 1.5–4.5)
Glucose: 96 mg/dL (ref 65–99)
Potassium: 4.3 mmol/L (ref 3.5–5.2)
Sodium: 138 mmol/L (ref 134–144)
Total Protein: 7.1 g/dL (ref 6.0–8.5)

## 2019-12-29 LAB — HEMOGLOBIN A1C
Est. average glucose Bld gHb Est-mCnc: 114 mg/dL
Hgb A1c MFr Bld: 5.6 % (ref 4.8–5.6)

## 2019-12-29 LAB — CBC WITH DIFFERENTIAL/PLATELET
Basophils Absolute: 0.1 10*3/uL (ref 0.0–0.2)
Basos: 1 %
EOS (ABSOLUTE): 0.1 10*3/uL (ref 0.0–0.4)
Eos: 2 %
Hematocrit: 39.1 % (ref 34.0–46.6)
Hemoglobin: 12.4 g/dL (ref 11.1–15.9)
Immature Grans (Abs): 0 10*3/uL (ref 0.0–0.1)
Immature Granulocytes: 0 %
Lymphocytes Absolute: 1.5 10*3/uL (ref 0.7–3.1)
Lymphs: 20 %
MCH: 20.6 pg — ABNORMAL LOW (ref 26.6–33.0)
MCHC: 31.7 g/dL (ref 31.5–35.7)
MCV: 65 fL — ABNORMAL LOW (ref 79–97)
Monocytes Absolute: 0.5 10*3/uL (ref 0.1–0.9)
Monocytes: 6 %
Neutrophils Absolute: 5.3 10*3/uL (ref 1.4–7.0)
Neutrophils: 71 %
Platelets: 269 10*3/uL (ref 150–450)
RBC: 6.03 x10E6/uL — ABNORMAL HIGH (ref 3.77–5.28)
RDW: 15.5 % — ABNORMAL HIGH (ref 11.7–15.4)
WBC: 7.5 10*3/uL (ref 3.4–10.8)

## 2019-12-29 LAB — LIPID PANEL
Chol/HDL Ratio: 4.5 ratio — ABNORMAL HIGH (ref 0.0–4.4)
Cholesterol, Total: 185 mg/dL (ref 100–199)
HDL: 41 mg/dL (ref >39)
LDL Chol Calc (NIH): 122 mg/dL — ABNORMAL HIGH (ref 0–99)
Triglycerides: 120 mg/dL (ref 0–149)
VLDL Cholesterol Cal: 22 mg/dL (ref 5–40)

## 2019-12-29 LAB — TSH: TSH: 0.918 u[IU]/mL (ref 0.450–4.500)

## 2020-01-05 ENCOUNTER — Other Ambulatory Visit: Payer: Self-pay

## 2020-01-05 ENCOUNTER — Encounter: Payer: Self-pay | Admitting: Physician Assistant

## 2020-01-05 ENCOUNTER — Ambulatory Visit (INDEPENDENT_AMBULATORY_CARE_PROVIDER_SITE_OTHER): Payer: 59 | Admitting: Physician Assistant

## 2020-01-05 VITALS — BP 113/76 | HR 64 | Ht 65.5 in | Wt 203.0 lb

## 2020-01-05 DIAGNOSIS — F339 Major depressive disorder, recurrent, unspecified: Secondary | ICD-10-CM

## 2020-01-05 DIAGNOSIS — D649 Anemia, unspecified: Secondary | ICD-10-CM | POA: Diagnosis not present

## 2020-01-05 DIAGNOSIS — Z0000 Encounter for general adult medical examination without abnormal findings: Secondary | ICD-10-CM | POA: Diagnosis not present

## 2020-01-05 DIAGNOSIS — E7800 Pure hypercholesterolemia, unspecified: Secondary | ICD-10-CM | POA: Diagnosis not present

## 2020-01-05 DIAGNOSIS — E039 Hypothyroidism, unspecified: Secondary | ICD-10-CM | POA: Diagnosis not present

## 2020-01-05 DIAGNOSIS — F411 Generalized anxiety disorder: Secondary | ICD-10-CM

## 2020-01-05 MED ORDER — SERTRALINE HCL 100 MG PO TABS: 100.0000 mg | ORAL_TABLET | Freq: Every day | ORAL | 0 refills | Status: DC

## 2020-01-05 NOTE — Patient Instructions (Signed)
Preventive Care 40-54 Years Old, Female °Preventive care refers to visits with your health care provider and lifestyle choices that can promote health and wellness. This includes: °· A yearly physical exam. This may also be called an annual well check. °· Regular dental visits and eye exams. °· Immunizations. °· Screening for certain conditions. °· Healthy lifestyle choices, such as eating a healthy diet, getting regular exercise, not using drugs or products that contain nicotine and tobacco, and limiting alcohol use. °What can I expect for my preventive care visit? °Physical exam °Your health care provider will check your: °· Height and weight. This may be used to calculate body mass index (BMI), which tells if you are at a healthy weight. °· Heart rate and blood pressure. °· Skin for abnormal spots. °Counseling °Your health care provider may ask you questions about your: °· Alcohol, tobacco, and drug use. °· Emotional well-being. °· Home and relationship well-being. °· Sexual activity. °· Eating habits. °· Work and work environment. °· Method of birth control. °· Menstrual cycle. °· Pregnancy history. °What immunizations do I need? ° °Influenza (flu) vaccine °· This is recommended every year. °Tetanus, diphtheria, and pertussis (Tdap) vaccine °· You may need a Td booster every 10 years. °Varicella (chickenpox) vaccine °· You may need this if you have not been vaccinated. °Zoster (shingles) vaccine °· You may need this after age 60. °Measles, mumps, and rubella (MMR) vaccine °· You may need at least one dose of MMR if you were born in 1957 or later. You may also need a second dose. °Pneumococcal conjugate (PCV13) vaccine °· You may need this if you have certain conditions and were not previously vaccinated. °Pneumococcal polysaccharide (PPSV23) vaccine °· You may need one or two doses if you smoke cigarettes or if you have certain conditions. °Meningococcal conjugate (MenACWY) vaccine °· You may need this if you  have certain conditions. °Hepatitis A vaccine °· You may need this if you have certain conditions or if you travel or work in places where you may be exposed to hepatitis A. °Hepatitis B vaccine °· You may need this if you have certain conditions or if you travel or work in places where you may be exposed to hepatitis B. °Haemophilus influenzae type b (Hib) vaccine °· You may need this if you have certain conditions. °Human papillomavirus (HPV) vaccine °· If recommended by your health care provider, you may need three doses over 6 months. °You may receive vaccines as individual doses or as more than one vaccine together in one shot (combination vaccines). Talk with your health care provider about the risks and benefits of combination vaccines. °What tests do I need? °Blood tests °· Lipid and cholesterol levels. These may be checked every 5 years, or more frequently if you are over 50 years old. °· Hepatitis C test. °· Hepatitis B test. °Screening °· Lung cancer screening. You may have this screening every year starting at age 55 if you have a 30-pack-year history of smoking and currently smoke or have quit within the past 15 years. °· Colorectal cancer screening. All adults should have this screening starting at age 50 and continuing until age 75. Your health care provider may recommend screening at age 45 if you are at increased risk. You will have tests every 1-10 years, depending on your results and the type of screening test. °· Diabetes screening. This is done by checking your blood sugar (glucose) after you have not eaten for a while (fasting). You may have this   done every 1-3 years.  Mammogram. This may be done every 1-2 years. Talk with your health care provider about when you should start having regular mammograms. This may depend on whether you have a family history of breast cancer.  BRCA-related cancer screening. This may be done if you have a family history of breast, ovarian, tubal, or peritoneal  cancers.  Pelvic exam and Pap test. This may be done every 3 years starting at age 76. Starting at age 89, this may be done every 5 years if you have a Pap test in combination with an HPV test. Other tests  Sexually transmitted disease (STD) testing.  Bone density scan. This is done to screen for osteoporosis. You may have this scan if you are at high risk for osteoporosis. Follow these instructions at home: Eating and drinking  Eat a diet that includes fresh fruits and vegetables, whole grains, lean protein, and low-fat dairy.  Take vitamin and mineral supplements as recommended by your health care provider.  Do not drink alcohol if: ? Your health care provider tells you not to drink. ? You are pregnant, may be pregnant, or are planning to become pregnant.  If you drink alcohol: ? Limit how much you have to 0-1 drink a day. ? Be aware of how much alcohol is in your drink. In the [place] one drink equals one 12 oz bottle of beer (355 mL), one 5 oz glass of wine (148 mL), or one 1 oz glass of hard liquor (44 mL). Lifestyle  Take daily care of your teeth and gums.  Stay active. Exercise for at least 30 minutes on 5 or more days each week.  Do not use any products that contain nicotine or tobacco, such as cigarettes, e-cigarettes, and chewing tobacco. If you need help quitting, ask your health care provider.  If you are sexually active, practice safe sex. Use a condom or other form of birth control (contraception) in order to prevent pregnancy and STIs (sexually transmitted infections).  If told by your health care provider, take low-dose aspirin daily starting at age 37. What's next?  Visit your health care provider once a year for a well check visit.  Ask your health care provider how often you should have your eyes and teeth checked.  Stay up to date on all vaccines. This information is not intended to replace advice given to you by your health care provider. Make sure you  discuss any questions you have with your health care provider. Document Revised: 10/17/2017 Document Reviewed: 10/17/2017 Elsevier Patient Education  2020 [employer].

## 2020-01-05 NOTE — Progress Notes (Signed)
Female Physical   Impression and Recommendations:    1. Healthcare maintenance   2. Hypothyroidism, unspecified type   3. Anemia, unspecified type   4. Elevated LDL cholesterol level   5. GAD (generalized anxiety disorder)   6. Depression, recurrent ([place])      1) Anticipatory Guidance: Discussed skin CA prevention and sunscreen when outside along with skin surveillance; eating a balanced and modest diet; physical activity at least 25 minutes per day or minimum of 150 min/ week moderate to intense activity.  2) Immunizations / Screenings / Labs:   All immunizations are up-to-date per recommendations or will be updated today if pt allows.    - Patient understands with dental and vision screens they will schedule independently.  - Obtained CBC, CMP, HgA1c, Lipid panel, and TSH when fasting.  Most labs are essentially within normal limits or stable from prior with the exception of lipid panel.  Bad cholesterol increased. -Up-to-date on Pap smear, mammogram (10/08/19 w/ OB-GYN), colonoscopy, Tdap and influenza vaccine.  Completed Shingrix series. -Declined hep C and HIV screenings.  3) Weight:  Discussed goal to improve diet habits to improve overall feelings of well being and objective health data. Improve nutrient density of diet through increasing intake of fruits and vegetables and decreasing saturated fats, white flour products and refined sugars.  4) Healthcare maintenance: -PHQ-9 score of 15, discussed with patient medication adjustment and is agreeable to increasing sertraline to 100 mg. -Continue levothyroxine 112 mcg. -Recommend to reduce saturated and transfats and stay as active as possible. -Follow-up in 3 months for mood management.   Meds ordered this encounter  Medications  . sertraline (ZOLOFT) 100 MG tablet    Sig: Take 1 tablet (100 mg total) by mouth daily.    Dispense:  90 tablet    Refill:  0    No orders of the defined types were placed in this  encounter.    Return in about 3 months (around 04/06/2020) for Mood.     Gross side effects, risk and benefits, and alternatives of medications discussed with patient.  Patient is aware that all medications have potential side effects and we are unable to predict every side effect or drug-drug interaction that may occur.  Expresses verbal understanding and consents to current therapy plan and treatment regimen.  F-up preventative CPE in 1 year- this is in addition to any chronic care visits.    Please see orders placed and AVS handed out to patient at the end of our visit for further patient instructions/ counseling done pertaining to today's office visit.   Note:  This note was prepared with assistance of Dragon voice recognition software. Occasional wrong-word or sound-a-like substitutions may have occurred due to the inherent limitations of voice recognition software.    Subjective:     CPE HPI: Janet Douglas is a 54 y.o. female who presents [facility] today for a yearly health maintenance exam.   Health Maintenance Summary  - Reviewed and updated, unless pt declines services.  Last Cologuard or Colonoscopy:    12/24/2016-repeat in 10 years Tobacco History Reviewed:  Y, never smoker Alcohol and/or drug use:    No concerns; no use Dental Home: Y Eye exams:Y Female Health:  PAP Smear - last known results: 05/02/2017-normal, followed by OB/GYN STD concerns:   none Birth control method: Tubal ligation Lumps or breast concerns:    none       Immunization History  Administered Date(s)  Administered  . Influenza Whole 11/20/2007, 11/24/2008, 10/30/2010  . Influenza, Seasonal, Injecte, Preservative Fre 11/06/2014  . Influenza,inj,Quad PF,6+ Mos 11/28/2012, 10/19/2013, 10/28/2017, 11/04/2018  . Influenza-Unspecified 11/01/2016  . Td 04/24/2007  . Tdap 10/28/2017  . Zoster Recombinat (Shingrix) 12/10/2017, 03/12/2018     Health  Maintenance  Topic Date Due  . Hepatitis C Screening  Never done  . PNEUMOCOCCAL POLYSACCHARIDE VACCINE AGE 58-64 HIGH RISK  Never done  . FOOT EXAM  Never done  . OPHTHALMOLOGY EXAM  Never done  . URINE MICROALBUMIN  Never done  . COVID-19 Vaccine (1) Never done  . INFLUENZA VACCINE  09/20/2019  . HIV Screening  10/29/2027 (Originally 03/21/1980)  . PAP SMEAR-Modifier  05/02/2020  . HEMOGLOBIN A1C  06/26/2020  . MAMMOGRAM  07/08/2020  . COLONOSCOPY  12/25/2026  . TETANUS/TDAP  10/29/2027     Wt Readings from Last 3 Encounters:  01/05/20 203 lb (92.1 kg)  01/01/19 200 lb 6.4 oz (90.9 kg)  09/16/18 198 lb (89.8 kg)   BP Readings from Last 3 Encounters:  01/05/20 113/76  01/01/19 103/69  08/11/18 125/81   Pulse Readings from Last 3 Encounters:  01/05/20 64  01/01/19 73  08/11/18 72     Past Medical History:  Diagnosis Date  . Anemia    NOS  . Anxiety    Phreesia 01/04/2020  . Arthritis    Phreesia 01/04/2020  . Brain tumor ([place])    S/P resection with shunt  . Cancer ([place])    Phreesia 01/04/2020  . Depression    Phreesia 01/04/2020  . Hypothyroidism   . Thyroid disease    Phreesia 01/04/2020  . Torn rotator cuff       Past Surgical History:  Procedure Laterality Date  . Abdominal US  05/19/1999   Negative  . Abdominal US  08/10   Normal  . BRAIN SURGERY N/A    Phreesia 01/04/2020  . 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 TUBAL LIGATION        Family History  Problem Relation Age of Onset  . Thyroid disease Mother   . Heart attack Father   . Hypertension Father   . Thyroid disease Sister   . Heart disease Sister        Low heart rate  . Hypertension Sister   . Thyroid disease Brother   . Hypertension Brother   . Thyroid disease Brother   . Colon cancer Neg Hx       Social History   Substance and Sexual Activity  Drug Use No  ,   Social History   Substance and Sexual Activity  Alcohol Use Yes  . Alcohol/week: 0.0  standard drinks   Comment: Minimal- 1/week  ,   Social History   Tobacco Use  Smoking Status Never Smoker  Smokeless Tobacco Never Used  ,   Social History   Substance and Sexual Activity  Sexual Activity Yes  . Birth control/protection: None    Current Outpatient Medications on File Prior to Visit  Medication Sig Dispense Refill  . acetaminophen (TYLENOL) 325 MG tablet Take 650 mg by mouth every 6 (six) hours as needed.    Marland Kitchen levothyroxine (SYNTHROID) 112 MCG tablet TAKE 1 TABLET BY MOUTH  DAILY 90 tablet 0  . meloxicam (MOBIC) 15 MG tablet Take 15 mg by mouth as needed for pain.     No current facility-administered medications on file prior to visit.    Allergies: Bupropion hcl  Review  of Systems: General:   Denies fever, chills, unexplained weight loss.  Optho/Auditory:   Denies visual changes, blurred vision/LOV Respiratory:   Denies SOB, DOE more than baseline levels.   Cardiovascular:   Denies chest pain, palpitations, new onset peripheral edema  Gastrointestinal:   Denies nausea, vomiting, diarrhea.  Genitourinary: Denies dysuria, freq/ urgency, flank pain or discharge from genitals.  Endocrine:     Denies hot or cold intolerance, polyuria, polydipsia. Musculoskeletal:   Denies unexplained myalgias, joint swelling, unexplained arthralgias, gait problems.  Skin:  Denies rash, suspicious lesions Neurological:     Denies dizziness, unexplained weakness, numbness  Psychiatric/Behavioral:   Denies mood changes, suicidal or homicidal ideations, hallucinations    Objective:    Blood pressure 113/76, pulse 64, height 5' 5.5" (1.664 m), weight 203 lb (92.1 kg), SpO2 96 %. Body mass index is 33.27 kg/m. General Appearance:    Alert, cooperative, no distress, appears stated age  Head:    Normocephalic, without obvious abnormality, atraumatic  Eyes:    PERRL, conjunctiva/corneas clear, EOM's intact, both eyes  Ears:    Normal TM's and external ear canals, both ears   Nose:   Nares normal, septum midline, mucosa normal, no drainage    or sinus tenderness  Throat:   Lips w/o lesion, mucosa moist, and tongue normal; teeth and   gums normal  Neck:   Supple, symmetrical, trachea midline, no adenopathy;    thyroid: no enlargement/tenderness/nodules; no JVD  Back:     Symmetric, no curvature, ROM normal, no CVA tenderness  Lungs:     Clear to auscultation bilaterally, respirations unlabored, no       Wh/ R/ R  Chest Wall:    No tenderness or gross deformity; normal excursion   Heart:    Regular rate and rhythm, S1 and S2 normal, no murmur, rub   or gallop  Breast Exam:    Deferred to OB/GYN.  Abdomen:     Soft, non-tender, bowel sounds active all four quadrants, No   G/R/R, no masses, no organomegaly  Genitalia:    Deferred to OB/GYN.  Rectal:    Deferred to OB/GYN.  Extremities:   Extremities normal, atraumatic, no cyanosis or gross edema  Pulses:   2+ and symmetric all extremities  Skin:   Warm, dry, Skin color, texture, turgor normal, no obvious rashes or lesions Psych: No HI/SI, judgement and insight good, Euthymic mood. Full Affect.  Neurologic:   CNII-XII grossly intact, normal strength, sensation and reflexes throughout

## 2020-01-21 ENCOUNTER — Other Ambulatory Visit: Payer: Self-pay | Admitting: Physician Assistant

## 2020-01-21 DIAGNOSIS — E039 Hypothyroidism, unspecified: Secondary | ICD-10-CM

## 2020-02-29 ENCOUNTER — Other Ambulatory Visit: Payer: Self-pay | Admitting: Physician Assistant

## 2020-02-29 DIAGNOSIS — F339 Major depressive disorder, recurrent, unspecified: Secondary | ICD-10-CM

## 2020-02-29 DIAGNOSIS — F411 Generalized anxiety disorder: Secondary | ICD-10-CM

## 2020-04-06 ENCOUNTER — Ambulatory Visit: Payer: 59 | Admitting: Physician Assistant

## 2020-04-13 ENCOUNTER — Other Ambulatory Visit: Payer: Self-pay | Admitting: Physician Assistant

## 2020-04-13 DIAGNOSIS — E039 Hypothyroidism, unspecified: Secondary | ICD-10-CM

## 2020-04-14 ENCOUNTER — Ambulatory Visit (INDEPENDENT_AMBULATORY_CARE_PROVIDER_SITE_OTHER): Payer: 59 | Admitting: Physician Assistant

## 2020-04-14 ENCOUNTER — Other Ambulatory Visit: Payer: Self-pay

## 2020-04-14 VITALS — BP 115/74 | HR 64 | Temp 99.4°F | Wt 202.0 lb

## 2020-04-14 DIAGNOSIS — F411 Generalized anxiety disorder: Secondary | ICD-10-CM | POA: Diagnosis not present

## 2020-04-14 DIAGNOSIS — F339 Major depressive disorder, recurrent, unspecified: Secondary | ICD-10-CM

## 2020-04-14 DIAGNOSIS — Z8616 Personal history of COVID-19: Secondary | ICD-10-CM

## 2020-04-14 NOTE — Patient Instructions (Signed)

## 2020-04-14 NOTE — Progress Notes (Signed)
Established Patient Office Visit  Subjective:  Patient ID: Janet Douglas, female    DOB: 07-25-1965  Age: 55 y.o. MRN: [id number]  CC: No chief complaint on file.   HPI [name] presents for follow up on mood management. Sertraline was increased at last visit and reports has noticed an improvement with her mood. Denies SI/HI. Patient reports has been staying busy with work and had Covid-19 infection last month. Continues to have fatigue which she had before getting sick with Covid-19. Feels like medication has helped her make it through challenging times. Has no other acute concerns.     Past Medical History:  Diagnosis Date  . Anemia    NOS  . Anxiety    Phreesia 01/04/2020  . Arthritis    Phreesia 01/04/2020  . Brain tumor ([place])    S/P resection with shunt  . Cancer ([place])    Phreesia 01/04/2020  . Depression    Phreesia 01/04/2020  . Hypothyroidism   . Thyroid disease    Phreesia 01/04/2020  . Torn rotator cuff     Past Surgical History:  Procedure Laterality Date  . Abdominal US  05/19/1999   Negative  . Abdominal US  08/10   Normal  . BRAIN SURGERY N/A    Phreesia 01/04/2020  . 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 TUBAL LIGATION      Family History  Problem Relation Age of Onset  . Thyroid disease Mother   . Heart attack Father   . Hypertension Father   . Thyroid disease Sister   . Heart disease Sister        Low heart rate  . Hypertension Sister   . Thyroid disease Brother   . Hypertension Brother   . Thyroid disease Brother   . Colon cancer Neg Hx     Social History   Socioeconomic History  . Marital status: Married    Spouse name: Not on file  . Number of children: 1  . Years of education: Not on file  . Highest education level: Not on file  Occupational History  . Occupation: Buyer, retail: [employer]  Tobacco Use  . Smoking status: Never Smoker  . Smokeless tobacco: Never Used  Vaping Use  . Vaping  Use: Never used  Substance and Sexual Activity  . Alcohol use: Yes    Alcohol/week: 0.0 standard drinks    Comment: Minimal- 1/week  . Drug use: No  . Sexual activity: Yes    Birth control/protection: None  Other Topics Concern  . Not on file  Social History Narrative   One son, one step-son.   From [place].   Social Determinants of Health   Financial Resource Strain: Not on file  Food Insecurity: Not on file  Transportation Needs: Not on file  Physical Activity: Not on file  Stress: Not on file  Social Connections: Not on file  Intimate Partner Violence: Not on file    Outpatient Medications Prior to Visit  Medication Sig Dispense Refill  . acetaminophen (TYLENOL) 325 MG tablet Take 650 mg by mouth every 6 (six) hours as needed.    Marland Kitchen levothyroxine (SYNTHROID) 112 MCG tablet TAKE 1 TABLET BY MOUTH  DAILY 90 tablet 1  . meloxicam (MOBIC) 15 MG tablet Take 15 mg by mouth as needed for pain.    Marland Kitchen sertraline (ZOLOFT) 100 MG tablet TAKE 1 TABLET BY MOUTH  DAILY 90 tablet 0   No  facility-administered medications prior to visit.    Allergies  Allergen Reactions  . Bupropion Hcl     REACTION: itching    ROS Review of Systems A fourteen system review of systems was performed and found to be positive as per HPI.  Objective:    Physical Exam General:  Well Developed, well nourished, appropriate for stated age.  Neuro:  Alert and oriented,  extra-ocular muscles intact  HEENT:  Normocephalic, atraumatic, neck supple Skin:  no gross rash, warm, pink. Cardiac:  RRR, S1 S2 w/o murmur  Respiratory:  ECTA B/L w/o wheezing, Not using accessory muscles, speaking in full sentences- unlabored. Vascular:  Ext warm, no cyanosis apprec.; cap RF less 2 sec. Psych:  No HI/SI, judgement and insight good, Euthymic mood. Full Affect.   BP 115/74   Pulse 64   Temp 99.4 F (37.4 C)   Wt 202 lb (91.6 kg)   SpO2 95%   BMI 33.10 kg/m  Wt Readings from Last 3 Encounters:  04/14/20 202  lb (91.6 kg)  01/05/20 203 lb (92.1 kg)  01/01/19 200 lb 6.4 oz (90.9 kg)     Health Maintenance Due  Topic Date Due  . Hepatitis C Screening  Never done  . PNEUMOCOCCAL POLYSACCHARIDE VACCINE AGE 28-64 HIGH RISK  Never done  . COVID-19 Vaccine (1) Never done  . FOOT EXAM  Never done  . OPHTHALMOLOGY EXAM  Never done  . URINE MICROALBUMIN  Never done  . INFLUENZA VACCINE  09/20/2019  . PAP SMEAR-Modifier  05/02/2020    There are no preventive care reminders to display for this patient.  Lab Results  Component Value Date   TSH 0.918 12/28/2019   Lab Results  Component Value Date   WBC 7.5 12/28/2019   HGB 12.4 12/28/2019   HCT 39.1 12/28/2019   MCV 65 (L) 12/28/2019   PLT 269 12/28/2019   Lab Results  Component Value Date   NA 138 12/28/2019   K 4.3 12/28/2019   CO2 22 12/28/2019   GLUCOSE 96 12/28/2019   BUN 20 12/28/2019   CREATININE 0.90 12/28/2019   BILITOT 0.5 12/28/2019   ALKPHOS 86 12/28/2019   AST 16 12/28/2019   ALT 16 12/28/2019   PROT 7.1 12/28/2019   ALBUMIN 4.5 12/28/2019   CALCIUM 9.6 12/28/2019   ANIONGAP 7 05/01/2018   GFR 78.73 10/28/2017   Lab Results  Component Value Date   CHOL 185 12/28/2019   Lab Results  Component Value Date   HDL 41 12/28/2019   Lab Results  Component Value Date   LDLCALC 122 (H) 12/28/2019   Lab Results  Component Value Date   TRIG 120 12/28/2019   Lab Results  Component Value Date   CHOLHDL 4.5 (H) 12/28/2019   Lab Results  Component Value Date   HGBA1C 5.6 12/28/2019      Assessment & Plan:   Problem List Items Addressed This Visit      Other   GAD (generalized anxiety disorder) (Chronic)    Other Visit Diagnoses    Depression, recurrent ([place])    -  Primary   History of COVID-19         Recurrent Depression, GAD: -PHQ-9 score of 5, improved from score of 15. GAD-7 score of 4, stable. -Will continue current medication regimen. -Encourage to incorporate mindfulness therapy. -Will  continue to monitor.  History of Covid-19: -Reassurance provided. -Discussed with patient potential post Covid syndrome symptoms and recommend to continue to increase activity as  tolerable.   Of note, advised patient to follow up with Percell Miller and Yvonne Kendall for work restrictions regarding rotator cuff injury that is work comp related.   No orders of the defined types were placed in this encounter.   Follow-up: Return in about 4 months (around 08/12/2020) for Mood, Thyroid.    Lorrene Reid, PA-C

## 2020-05-29 ENCOUNTER — Other Ambulatory Visit: Payer: Self-pay | Admitting: Physician Assistant

## 2020-05-29 DIAGNOSIS — F411 Generalized anxiety disorder: Secondary | ICD-10-CM

## 2020-05-29 DIAGNOSIS — F339 Major depressive disorder, recurrent, unspecified: Secondary | ICD-10-CM

## 2020-06-02 ENCOUNTER — Other Ambulatory Visit: Payer: Self-pay | Admitting: Orthopedic Surgery

## 2020-06-02 DIAGNOSIS — M25512 Pain in left shoulder: Secondary | ICD-10-CM

## 2020-06-09 ENCOUNTER — Other Ambulatory Visit: Payer: Self-pay | Admitting: Orthopedic Surgery

## 2020-06-09 DIAGNOSIS — M25511 Pain in right shoulder: Secondary | ICD-10-CM

## 2020-06-10 ENCOUNTER — Ambulatory Visit
Admission: RE | Admit: 2020-06-10 | Discharge: 2020-06-10 | Disposition: A | Discharge status: Home / Self Care | Payer: 59 | Source: Ambulatory Visit | Attending: Orthopedic Surgery | Admitting: Orthopedic Surgery

## 2020-06-10 ENCOUNTER — Other Ambulatory Visit: Payer: Self-pay

## 2020-06-10 DIAGNOSIS — M25511 Pain in right shoulder: Secondary | ICD-10-CM

## 2020-07-12 ENCOUNTER — Encounter: Payer: Self-pay | Admitting: Physician Assistant

## 2020-07-12 MED ORDER — MELOXICAM 15 MG PO TABS: 15.0000 mg | ORAL_TABLET | ORAL | 0 refills | Status: DC | PRN

## 2020-07-12 NOTE — Telephone Encounter (Signed)
Pt requesting refill of Meloxicam. This med has not been given by you before. Please approve if deemed appropriate. AS, CMA

## 2020-07-18 ENCOUNTER — Encounter: Payer: Self-pay | Admitting: Physician Assistant

## 2020-09-13 ENCOUNTER — Ambulatory Visit (INDEPENDENT_AMBULATORY_CARE_PROVIDER_SITE_OTHER): Payer: 59 | Admitting: Physician Assistant

## 2020-09-13 ENCOUNTER — Encounter: Payer: Self-pay | Admitting: Physician Assistant

## 2020-09-13 ENCOUNTER — Other Ambulatory Visit: Payer: Self-pay

## 2020-09-13 VITALS — BP 131/77 | HR 60 | Temp 98.4°F | Ht 66.0 in | Wt 193.8 lb

## 2020-09-13 DIAGNOSIS — G9332 Myalgic encephalomyelitis/chronic fatigue syndrome: Secondary | ICD-10-CM

## 2020-09-13 DIAGNOSIS — U099 Post covid-19 condition, unspecified: Secondary | ICD-10-CM

## 2020-09-13 DIAGNOSIS — R5382 Chronic fatigue, unspecified: Secondary | ICD-10-CM | POA: Diagnosis not present

## 2020-09-13 DIAGNOSIS — F339 Major depressive disorder, recurrent, unspecified: Secondary | ICD-10-CM | POA: Diagnosis not present

## 2020-09-13 DIAGNOSIS — E039 Hypothyroidism, unspecified: Secondary | ICD-10-CM

## 2020-09-13 DIAGNOSIS — F411 Generalized anxiety disorder: Secondary | ICD-10-CM

## 2020-09-13 DIAGNOSIS — Z87898 Personal history of other specified conditions: Secondary | ICD-10-CM

## 2020-09-13 NOTE — Progress Notes (Signed)
Established Patient Office Visit  Subjective:  Patient ID: Janet Douglas, female    DOB: 03-04-1965  Age: 55 y.o. MRN: [id number]  CC:  Chief Complaint  Patient presents with   Follow-up    Mood   Thyroid Problem    HPI Janet Douglas presents for follow up on mood and hypothyroid. Patient reports had Covid infection in [place] and still has mental fog and fatigue. Also reports having nausea lately which she feels is related to the weather. Patient does have a shunt s/p brain tumor resection. It has been a couple years since she has seen her neurologist. Denies headache, syncope, LOC, dizziness or altered mental status.  Mood: Reports medication compliance. Patient denies significant mood changes, SI or HI.  Hypothyroidism: Taking medication as directed. No palpitations, weight or bowel changes.  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   Surgery   TUBAL LIGATION      Family History  Problem Relation Age of Onset   Thyroid disease Mother    Heart attack Father    Hypertension Father    Thyroid disease Sister    Heart disease Sister        Low heart rate   Hypertension Sister    Thyroid disease Brother    Hypertension Brother    Thyroid disease Brother    Colon cancer Neg Hx     Social History   Socioeconomic History   Marital status: Married    Spouse name: Not on file   Number of children: 1   Years of education: Not on file   Highest education level: Not on file   Occupational History   Occupation: Buyer, retail: [employer]  Tobacco Use   Smoking status: Never   Smokeless tobacco: Never  Vaping Use   Vaping Use: Never used  Substance and Sexual Activity   Alcohol use: Yes    Alcohol/week: 0.0 standard drinks    Comment: Minimal- 1/week   Drug use: No   Sexual activity: Yes    Birth control/protection: None  Other Topics Concern   Not on file  Social History Narrative   One son, one step-son.   From [place].   Social Determinants of Health   Financial Resource Strain: Not on file  Food Insecurity: Not on file  Transportation Needs: Not on file  Physical Activity: Not on file  Stress: Not on file  Social Connections: Not on file  Intimate Partner Violence: Not on file    Outpatient Medications Prior to Visit  Medication Sig Dispense Refill   ibuprofen (ADVIL) 200 MG tablet Take 200 mg by mouth every 6 (six) hours as needed.     acetaminophen (TYLENOL) 325 MG tablet Take 650 mg by mouth every 6 (six) hours as needed.  levothyroxine (SYNTHROID) 112 MCG tablet TAKE 1 TABLET BY MOUTH  DAILY 90 tablet 1   sertraline (ZOLOFT) 100 MG tablet TAKE 1 TABLET BY MOUTH  DAILY 90 tablet 1   meloxicam (MOBIC) 15 MG tablet Take 1 tablet (15 mg total) by mouth as needed for pain. 30 tablet 0   No facility-administered medications prior to visit.    Allergies  Allergen Reactions   Bupropion Hcl     REACTION: itching    ROS Review of Systems Review of Systems:  A fourteen system review of systems was performed and found to be positive as per HPI.   Objective:    Physical Exam General:  Well Developed, well nourished, appropriate for stated age.  Neuro:  Alert and oriented, extra-ocular muscles intact, CN II-XII grossly intact  HEENT:  Normocephalic, atraumatic, neck supple, no carotid bruits appreciated  Skin:  no gross rash, warm, pink. Cardiac:  RRR Respiratory:  ECTA B/L w/o wheezing, crackles or rales, Not using  accessory muscles, speaking in full sentences- unlabored. Vascular:  Ext warm, no cyanosis apprec.; cap RF less 2 sec. Psych:  No HI/SI, judgement and insight good, Euthymic mood. Full Affect.  BP 131/77   Pulse 60   Temp 98.4 F (36.9 C)   Ht '5\' 6"'$  (1.676 m)   Wt 193 lb 12.8 oz (87.9 kg)   SpO2 98%   BMI 31.28 kg/m  Wt Readings from Last 3 Encounters:  09/13/20 193 lb 12.8 oz (87.9 kg)  04/14/20 202 lb (91.6 kg)  01/05/20 203 lb (92.1 kg)     Health Maintenance Due  Topic Date Due   PNEUMOCOCCAL POLYSACCHARIDE VACCINE AGE 33-64 HIGH RISK  Never done   COVID-19 Vaccine (1) Never done   FOOT EXAM  Never done   OPHTHALMOLOGY EXAM  Never done   URINE MICROALBUMIN  Never done   Hepatitis C Screening  Never done   PAP SMEAR-Modifier  05/02/2020   HEMOGLOBIN A1C  06/26/2020   MAMMOGRAM  07/08/2020    There are no preventive care reminders to display for this patient.  Lab Results  Component Value Date   TSH 0.918 12/28/2019   Lab Results  Component Value Date   WBC 7.5 12/28/2019   HGB 12.4 12/28/2019   HCT 39.1 12/28/2019   MCV 65 (L) 12/28/2019   PLT 269 12/28/2019   Lab Results  Component Value Date   NA 138 12/28/2019   K 4.3 12/28/2019   CO2 22 12/28/2019   GLUCOSE 96 12/28/2019   BUN 20 12/28/2019   CREATININE 0.90 12/28/2019   BILITOT 0.5 12/28/2019   ALKPHOS 86 12/28/2019   AST 16 12/28/2019   ALT 16 12/28/2019   PROT 7.1 12/28/2019   ALBUMIN 4.5 12/28/2019   CALCIUM 9.6 12/28/2019   ANIONGAP 7 05/01/2018   GFR 78.73 10/28/2017   Lab Results  Component Value Date   CHOL 185 12/28/2019   Lab Results  Component Value Date   HDL 41 12/28/2019   Lab Results  Component Value Date   LDLCALC 122 (H) 12/28/2019   Lab Results  Component Value Date   TRIG 120 12/28/2019   Lab Results  Component Value Date   CHOLHDL 4.5 (H) 12/28/2019   Lab Results  Component Value Date   HGBA1C 5.6 12/28/2019      Assessment & Plan:   Problem List  Items Addressed This Visit       Endocrine   Hypothyroidism     Nervous and  Auditory   History of brain tumor     Other   GAD (generalized anxiety disorder) (Chronic)   Other Visit Diagnoses     Depression, recurrent ([place])    -  Primary   Post-COVID chronic fatigue          Recurrent Depression, GAD: -PHQ-9 score of 7, GAD-7 score of 4. Stable. -Continue current medication regimen. -Will continue to monitor.  Hypothyroidism: -Last TSH 0.918, wnl's. -Will repeat thyroid labs with CPE. -Continue current medication regimen.  History of brain tumor: -Recommend to follow up with neurologist to recheck shunt.  Post-COVID chronic fatigue: -Reassurance provided.   No orders of the defined types were placed in this encounter.   Follow-up: Return in about 4 months (around 01/14/2021) for CPE and FBW few days prior.    Lorrene Reid, PA-C

## 2020-09-29 ENCOUNTER — Other Ambulatory Visit: Payer: Self-pay | Admitting: Physician Assistant

## 2020-09-29 DIAGNOSIS — E039 Hypothyroidism, unspecified: Secondary | ICD-10-CM

## 2020-11-24 ENCOUNTER — Other Ambulatory Visit: Payer: Self-pay | Admitting: Physician Assistant

## 2020-11-24 DIAGNOSIS — F339 Major depressive disorder, recurrent, unspecified: Secondary | ICD-10-CM

## 2020-11-24 DIAGNOSIS — F411 Generalized anxiety disorder: Secondary | ICD-10-CM

## 2020-12-22 ENCOUNTER — Other Ambulatory Visit: Payer: Self-pay | Admitting: Physician Assistant

## 2020-12-22 DIAGNOSIS — E039 Hypothyroidism, unspecified: Secondary | ICD-10-CM

## 2021-01-03 ENCOUNTER — Other Ambulatory Visit: Payer: Self-pay

## 2021-01-03 DIAGNOSIS — E7800 Pure hypercholesterolemia, unspecified: Secondary | ICD-10-CM

## 2021-01-03 DIAGNOSIS — E039 Hypothyroidism, unspecified: Secondary | ICD-10-CM

## 2021-01-03 DIAGNOSIS — Z130 Encounter for screening for diseases of the blood and blood-forming organs and certain disorders involving the immune mechanism: Secondary | ICD-10-CM

## 2021-01-03 DIAGNOSIS — Z1321 Encounter for screening for nutritional disorder: Secondary | ICD-10-CM

## 2021-01-03 DIAGNOSIS — Z0000 Encounter for general adult medical examination without abnormal findings: Secondary | ICD-10-CM

## 2021-01-03 DIAGNOSIS — D509 Iron deficiency anemia, unspecified: Secondary | ICD-10-CM

## 2021-01-04 ENCOUNTER — Other Ambulatory Visit: Payer: 59

## 2021-01-04 ENCOUNTER — Other Ambulatory Visit: Payer: Self-pay

## 2021-01-04 DIAGNOSIS — E039 Hypothyroidism, unspecified: Secondary | ICD-10-CM

## 2021-01-04 DIAGNOSIS — E7800 Pure hypercholesterolemia, unspecified: Secondary | ICD-10-CM

## 2021-01-04 DIAGNOSIS — Z0000 Encounter for general adult medical examination without abnormal findings: Secondary | ICD-10-CM

## 2021-01-04 DIAGNOSIS — Z13228 Encounter for screening for other metabolic disorders: Secondary | ICD-10-CM

## 2021-01-04 DIAGNOSIS — D509 Iron deficiency anemia, unspecified: Secondary | ICD-10-CM

## 2021-01-04 DIAGNOSIS — Z130 Encounter for screening for diseases of the blood and blood-forming organs and certain disorders involving the immune mechanism: Secondary | ICD-10-CM

## 2021-01-05 LAB — CBC WITH DIFFERENTIAL/PLATELET
Basophils Absolute: 0 10*3/uL (ref 0.0–0.2)
Basos: 1 %
EOS (ABSOLUTE): 0.1 10*3/uL (ref 0.0–0.4)
Eos: 1 %
Hematocrit: 36.6 % (ref 34.0–46.6)
Hemoglobin: 11.9 g/dL (ref 11.1–15.9)
Immature Grans (Abs): 0 10*3/uL (ref 0.0–0.1)
Immature Granulocytes: 0 %
Lymphocytes Absolute: 1.3 10*3/uL (ref 0.7–3.1)
Lymphs: 18 %
MCH: 20.4 pg — ABNORMAL LOW (ref 26.6–33.0)
MCHC: 32.5 g/dL (ref 31.5–35.7)
MCV: 63 fL — ABNORMAL LOW (ref 79–97)
Monocytes Absolute: 0.5 10*3/uL (ref 0.1–0.9)
Monocytes: 7 %
Neutrophils Absolute: 5.4 10*3/uL (ref 1.4–7.0)
Neutrophils: 73 %
Platelets: 294 10*3/uL (ref 150–450)
RBC: 5.82 x10E6/uL — ABNORMAL HIGH (ref 3.77–5.28)
RDW: 16.4 % — ABNORMAL HIGH (ref 11.7–15.4)
WBC: 7.3 10*3/uL (ref 3.4–10.8)

## 2021-01-05 LAB — COMPREHENSIVE METABOLIC PANEL
ALT: 16 IU/L (ref 0–32)
AST: 17 IU/L (ref 0–40)
Albumin/Globulin Ratio: 2 (ref 1.2–2.2)
Albumin: 4.6 g/dL (ref 3.8–4.9)
Alkaline Phosphatase: 92 IU/L (ref 44–121)
BUN/Creatinine Ratio: 15 (ref 9–23)
BUN: 13 mg/dL (ref 6–24)
Bilirubin Total: 0.5 mg/dL (ref 0.0–1.2)
CO2: 23 mmol/L (ref 20–29)
Calcium: 9.4 mg/dL (ref 8.7–10.2)
Chloride: 101 mmol/L (ref 96–106)
Creatinine, Ser: 0.85 mg/dL (ref 0.57–1.00)
Globulin, Total: 2.3 g/dL (ref 1.5–4.5)
Glucose: 91 mg/dL (ref 70–99)
Potassium: 4.4 mmol/L (ref 3.5–5.2)
Sodium: 139 mmol/L (ref 134–144)
Total Protein: 6.9 g/dL (ref 6.0–8.5)
eGFR: 81 mL/min/{1.73_m2} (ref >59)

## 2021-01-05 LAB — HEMOGLOBIN A1C
Est. average glucose Bld gHb Est-mCnc: 111 mg/dL
Hgb A1c MFr Bld: 5.5 % (ref 4.8–5.6)

## 2021-01-05 LAB — LIPID PANEL
Chol/HDL Ratio: 3.8 ratio (ref 0.0–4.4)
Cholesterol, Total: 195 mg/dL (ref 100–199)
HDL: 51 mg/dL (ref >39)
LDL Chol Calc (NIH): 126 mg/dL — ABNORMAL HIGH (ref 0–99)
Triglycerides: 97 mg/dL (ref 0–149)
VLDL Cholesterol Cal: 18 mg/dL (ref 5–40)

## 2021-01-05 LAB — TSH: TSH: 0.654 u[IU]/mL (ref 0.450–4.500)

## 2021-01-09 ENCOUNTER — Other Ambulatory Visit: Payer: Self-pay

## 2021-01-09 ENCOUNTER — Ambulatory Visit (INDEPENDENT_AMBULATORY_CARE_PROVIDER_SITE_OTHER): Payer: 59 | Admitting: Physician Assistant

## 2021-01-09 ENCOUNTER — Encounter: Payer: Self-pay | Admitting: Physician Assistant

## 2021-01-09 VITALS — BP 118/79 | HR 68 | Temp 97.7°F | Ht 66.0 in | Wt 192.0 lb

## 2021-01-09 DIAGNOSIS — Z23 Encounter for immunization: Secondary | ICD-10-CM

## 2021-01-09 DIAGNOSIS — E039 Hypothyroidism, unspecified: Secondary | ICD-10-CM | POA: Diagnosis not present

## 2021-01-09 DIAGNOSIS — Z0000 Encounter for general adult medical examination without abnormal findings: Secondary | ICD-10-CM

## 2021-01-09 DIAGNOSIS — N393 Stress incontinence (female) (male): Secondary | ICD-10-CM

## 2021-01-09 DIAGNOSIS — E7800 Pure hypercholesterolemia, unspecified: Secondary | ICD-10-CM

## 2021-01-09 NOTE — Patient Instructions (Addendum)
Taper of sertraline: Start taking 75 mg once daily x 2 weeks. Then take 50 mg once daily for 2 weeks. Then take 25 mg once daily for 2 weeks and then can discontinue medication.   Preventive Care 55-55 Years Old, Female Preventive care refers to lifestyle choices and visits with your health care provider that can promote health and wellness. Preventive care visits are also called wellness exams. What can I expect for my preventive care visit? Counseling Your health care provider may ask you questions about your: Medical history, including: Past medical problems. Family medical history. Pregnancy history. Current health, including: Menstrual cycle. Method of birth control. Emotional well-being. Home life and relationship well-being. Sexual activity and sexual health. Lifestyle, including: Alcohol, nicotine or tobacco, and drug use. Access to firearms. Diet, exercise, and sleep habits. Work and work Statistician. Sunscreen use. Safety issues such as seatbelt and bike helmet use. Physical exam Your health care provider will check your: Height and weight. These may be used to calculate your BMI (body mass index). BMI is a measurement that tells if you are at a healthy weight. Waist circumference. This measures the distance around your waistline. This measurement also tells if you are at a healthy weight and may help predict your risk of certain diseases, such as type 2 diabetes and high blood pressure. Heart rate and blood pressure. Body temperature. Skin for abnormal spots. What immunizations do I need? Vaccines are usually given at various ages, according to a schedule. Your health care provider will recommend vaccines for you based on your age, medical history, and lifestyle or other factors, such as travel or where you work. What tests do I need? Screening Your health care provider may recommend screening tests for certain conditions. This may include: Lipid and cholesterol  levels. Diabetes screening. This is done by checking your blood sugar (glucose) after you have not eaten for a while (fasting). Pelvic exam and Pap test. Hepatitis B test. Hepatitis C test. HIV (human immunodeficiency virus) test. STI (sexually transmitted infection) testing, if you are at risk. Lung cancer screening. Colorectal cancer screening. Mammogram. Talk with your health care provider about when you should start having regular mammograms. This may depend on whether you have a family history of breast cancer. BRCA-related cancer screening. This may be done if you have a family history of breast, ovarian, tubal, or peritoneal cancers. Bone density scan. This is done to screen for osteoporosis. Talk with your health care provider about your test results, treatment options, and if necessary, the need for more tests. Follow these instructions at home: Eating and drinking  Eat a diet that includes fresh fruits and vegetables, whole grains, lean protein, and low-fat dairy products. Take vitamin and mineral supplements as recommended by your health care provider. Do not drink alcohol if: Your health care provider tells you not to drink. You are pregnant, may be pregnant, or are planning to become pregnant. If you drink alcohol: Limit how much you have to 0-1 drink a day. Know how much alcohol is in your drink. In the [place] one drink equals one 12 oz bottle of beer (355 mL), one 5 oz glass of wine (148 mL), or one 1 oz glass of hard liquor (44 mL). Lifestyle Brush your teeth every morning and night with fluoride toothpaste. Floss one time each day. Exercise for at least 30 minutes 5 or more days each week. Do not use any products that contain nicotine or tobacco. These products include cigarettes, chewing tobacco, and  vaping devices, such as e-cigarettes. If you need help quitting, ask your health care provider. Do not use drugs. If you are sexually active, practice safe sex. Use a  condom or other form of protection to prevent STIs. If you do not wish to become pregnant, use a form of birth control. If you plan to become pregnant, see your health care provider for a prepregnancy visit. Take aspirin only as told by your health care provider. Make sure that you understand how much to take and what form to take. Work with your health care provider to find out whether it is safe and beneficial for you to take aspirin daily. Find healthy ways to manage stress, such as: Meditation, yoga, or listening to music. Journaling. Talking to a trusted person. Spending time with friends and family. Minimize exposure to UV radiation to reduce your risk of skin cancer. Safety Always wear your seat belt while driving or riding in a vehicle. Do not drive: If you have been drinking alcohol. Do not ride with someone who has been drinking. When you are tired or distracted. While texting. If you have been using any mind-altering substances or drugs. Wear a helmet and other protective equipment during sports activities. If you have firearms in your house, make sure you follow all gun safety procedures. Seek help if you have been physically or sexually abused. What's next? Visit your health care provider once a year for an annual wellness visit. Ask your health care provider how often you should have your eyes and teeth checked. Stay up to date on all vaccines. This information is not intended to replace advice given to you by your health care provider. Make sure you discuss any questions you have with your health care provider. Document Revised: 08/03/2020 Document Reviewed: 08/03/2020 Elsevier Patient Education  [place].

## 2021-01-09 NOTE — Progress Notes (Signed)
Subjective:     Janet Douglas is a 55 y.o. female and is here for a comprehensive physical exam. The patient reports problems - bladder leakage with lifting, sometimes has urinary urgency and frequency .  Social History   Socioeconomic History   Marital status: Married    Spouse name: Not on file   Number of children: 1   Years of education: Not on file   Highest education level: Not on file  Occupational History   Occupation: Buyer, retail: [employer]  Tobacco Use   Smoking status: Never   Smokeless tobacco: Never  Vaping Use   Vaping Use: Never used  Substance and Sexual Activity   Alcohol use: Yes    Alcohol/week: 0.0 standard drinks    Comment: Minimal- 1/week   Drug use: No   Sexual activity: Yes    Birth control/protection: None  Other Topics Concern   Not on file  Social History Narrative   One son, one step-son.   From [place].   Social Determinants of Health   Financial Resource Strain: Not on file  Food Insecurity: Not on file  Transportation Needs: Not on file  Physical Activity: Not on file  Stress: Not on file  Social Connections: Not on file  Intimate Partner Violence: Not on file   Health Maintenance  Topic Date Due   COVID-19 Vaccine (1) Never done   Pneumococcal Vaccine 33-49 Years old (1 - PCV) Never done   URINE MICROALBUMIN  Never done   Hepatitis C Screening  Never done   PAP SMEAR-Modifier  05/02/2020   MAMMOGRAM  07/08/2020   INFLUENZA VACCINE  09/19/2020   HIV Screening  10/29/2027 (Originally 03/21/1980)   COLONOSCOPY (Pts 45-62yrs Insurance coverage will need to be confirmed)  12/25/2026   TETANUS/TDAP  10/29/2027   Zoster Vaccines- Shingrix  Completed   HPV VACCINES  Aged Out    The following portions of the patient's history were reviewed and updated as appropriate: allergies, current medications, past family history, past medical history, past social history, past surgical history, and problem list.  Review of  Systems Pertinent items are noted in HPI.   Objective:    BP 118/79   Pulse 68   Temp 97.7 F (36.5 C)   Ht 5\' 6"  (1.676 m)   Wt 192 lb (87.1 kg)   SpO2 96%   BMI 30.99 kg/m  General appearance: alert, cooperative, and no distress Head: Normocephalic, without obvious abnormality, atraumatic Eyes: conjunctivae/corneas clear. PERRL, EOM's intact. Fundi benign. Ears: normal TM and external ear canal left ear and abnormal external canal right ear - ceruminosis impacting canal Nose: Nares normal. Septum midline. Mucosa normal. No drainage or sinus tenderness. Throat: lips, mucosa, and tongue normal; teeth and gums normal Neck: no adenopathy, no carotid bruit, supple, symmetrical, trachea midline, and thyroid: normal to inspection and palpation Back: symmetric, no curvature. ROM normal. No CVA tenderness. Lungs: clear to auscultation bilaterally Heart: regular rate and rhythm and S1, S2 normal Abdomen: soft, non-tender; bowel sounds normal; no masses,  no organomegaly Extremities: extremities normal, atraumatic, no cyanosis or edema Pulses: 2+ and symmetric Skin: mobility and turgor normal, no eczematous changes, and no edema or scattered skin growths and waxy brown papules Lymph nodes: Cervical adenopathy: normal and Supraclavicular adenopathy: normal Neurologic: Grossly normal    Assessment:    Healthy female exam.     Plan:  -Discussed most recent labs which are essentially within normal limits or stable  from prior with the exception of LDL. Discussed resuming heart healthy diet and stay as active as possible. -The 10-year ASCVD risk score (Arnett DK, et al., 2019) is: 3.4% -Will request records (pap,mammogram) from OB/GYN (Dr. Marvel Plan). Agreeable to influenza vaccine.  -Recommend to increase hydration. -Discussed skin lesions benign appearing (skin tags and SK), if lesions change recommend referral to dermatology.  -Patient wants to taper off sertraline, retiring soon and  stress levels will decreased. Provided tapering instructions. Recommend resuming medication therapy if mood becomes unstable. -Continue levothyroxine 112 mcg.  -Recommend conservative therapy for stress incontinence with Kegel exercises. -Follow up in 6 months for mood, hypothyroid.   See After Visit Summary for Counseling Recommendations

## 2021-05-08 ENCOUNTER — Encounter: Payer: Self-pay | Admitting: Physician Assistant

## 2021-05-11 ENCOUNTER — Other Ambulatory Visit: Payer: Self-pay | Admitting: Neurosurgery

## 2021-05-11 DIAGNOSIS — G919 Hydrocephalus, unspecified: Secondary | ICD-10-CM

## 2021-05-18 ENCOUNTER — Other Ambulatory Visit: Payer: Self-pay | Admitting: Physician Assistant

## 2021-05-18 DIAGNOSIS — E039 Hypothyroidism, unspecified: Secondary | ICD-10-CM

## 2021-06-06 ENCOUNTER — Ambulatory Visit
Admission: RE | Admit: 2021-06-06 | Discharge: 2021-06-06 | Disposition: A | Discharge status: Home / Self Care | Payer: 59 | Source: Ambulatory Visit | Attending: Neurosurgery | Admitting: Neurosurgery

## 2021-06-06 DIAGNOSIS — G919 Hydrocephalus, unspecified: Secondary | ICD-10-CM

## 2021-07-12 ENCOUNTER — Encounter: Payer: Self-pay | Admitting: Nurse Practitioner

## 2021-07-12 ENCOUNTER — Ambulatory Visit (INDEPENDENT_AMBULATORY_CARE_PROVIDER_SITE_OTHER): Payer: 59 | Admitting: Nurse Practitioner

## 2021-07-12 VITALS — BP 135/84 | HR 74 | Temp 97.8°F | Ht 66.14 in | Wt 209.8 lb

## 2021-07-12 DIAGNOSIS — Z6833 Body mass index (BMI) 33.0-33.9, adult: Secondary | ICD-10-CM

## 2021-07-12 DIAGNOSIS — E039 Hypothyroidism, unspecified: Secondary | ICD-10-CM | POA: Diagnosis not present

## 2021-07-12 DIAGNOSIS — R635 Abnormal weight gain: Secondary | ICD-10-CM | POA: Diagnosis not present

## 2021-07-12 DIAGNOSIS — F411 Generalized anxiety disorder: Secondary | ICD-10-CM | POA: Diagnosis not present

## 2021-07-12 MED ORDER — FLUOXETINE HCL 10 MG PO TABS: 10.0000 mg | ORAL_TABLET | Freq: Every day | ORAL | 2 refills | Status: DC

## 2021-07-12 MED ORDER — WEGOVY 0.25 MG/0.5ML ~~LOC~~ SOAJ: 0.2500 mg | SUBCUTANEOUS | 1 refills | Status: DC

## 2021-07-12 NOTE — Progress Notes (Signed)
Established patient visit   Patient: Janet Douglas   DOB: October 24, 1965   56 y.o. Female  MRN: [id number] Visit Date: 07/12/2021   Chief Complaint  Patient presents with   Follow-up   thyroid   Subjective    HPI  The patient is here for follow up. -feels as though menopause is causing her some issues --joint pain  --increased depression. Positive depression screen. Has stopped taking her sertraline completely. Weaned off of this after her last visit here.  -going to see neurology on Friday. History of brain tumor needing VP shunt. This was evaluated and now negative.  -concern about weight gain. Wants to try medication to help get her started with this. Has gained 17 pounds since her last visit 12/2020.    Medications: Outpatient Medications Prior to Visit  Medication Sig   acetaminophen (TYLENOL) 325 MG tablet Take 650 mg by mouth every 6 (six) hours as needed.   ibuprofen (ADVIL) 200 MG tablet Take 200 mg by mouth every 6 (six) hours as needed.   levothyroxine (SYNTHROID) 112 MCG tablet TAKE 1 TABLET BY MOUTH DAILY   [DISCONTINUED] sertraline (ZOLOFT) 100 MG tablet TAKE 1 TABLET BY MOUTH  DAILY   No facility-administered medications prior to visit.    Review of Systems  Constitutional:  Positive for fatigue. Negative for activity change, appetite change, chills and fever.       Weight gain 17 pounds since last visit   HENT:  Negative for congestion, postnasal drip, rhinorrhea, sinus pressure, sinus pain, sneezing and sore throat.   Eyes: Negative.   Respiratory:  Negative for cough, chest tightness, shortness of breath and wheezing.   Cardiovascular:  Negative for chest pain and palpitations.  Gastrointestinal:  Negative for abdominal pain, constipation, diarrhea, nausea and vomiting.  Endocrine: Negative for cold intolerance, heat intolerance, polydipsia and polyuria.  Genitourinary:  Negative for dyspareunia, dysuria, flank pain, frequency and urgency.  Musculoskeletal:   Negative for arthralgias, back pain and myalgias.  Skin:  Negative for rash.  Allergic/Immunologic: Negative for environmental allergies.  Neurological:  Negative for dizziness, weakness and headaches.  Hematological:  Negative for adenopathy.  Psychiatric/Behavioral:  Positive for dysphoric mood. The patient is not nervous/anxious.    Last CBC Lab Results  Component Value Date   WBC 7.3 01/04/2021   HGB 11.9 01/04/2021   HCT 36.6 01/04/2021   MCV 63 (L) 01/04/2021   MCH 20.4 (L) 01/04/2021   RDW 16.4 (H) 01/04/2021   PLT 294 28/31/5176   Last metabolic panel Lab Results  Component Value Date   GLUCOSE 91 01/04/2021   NA 139 01/04/2021   K 4.4 01/04/2021   CL 101 01/04/2021   CO2 23 01/04/2021   BUN 13 01/04/2021   CREATININE 0.85 01/04/2021   EGFR 81 01/04/2021   CALCIUM 9.4 01/04/2021   PROT 6.9 01/04/2021   ALBUMIN 4.6 01/04/2021   LABGLOB 2.3 01/04/2021   AGRATIO 2.0 01/04/2021   BILITOT 0.5 01/04/2021   ALKPHOS 92 01/04/2021   AST 17 01/04/2021   ALT 16 01/04/2021   ANIONGAP 7 05/01/2018   Last lipids Lab Results  Component Value Date   CHOL 195 01/04/2021   HDL 51 01/04/2021   LDLCALC 126 (H) 01/04/2021   LDLDIRECT 99.0 08/31/2014   TRIG 97 01/04/2021   CHOLHDL 3.8 01/04/2021   Last hemoglobin A1c Lab Results  Component Value Date   HGBA1C 5.5 01/04/2021   Last thyroid functions Lab Results  Component Value Date   TSH  0.654 01/04/2021       Objective     Today's Vitals   07/12/21 0947  BP: 135/84  Pulse: 74  Temp: 97.8 F (36.6 C)  SpO2: 95%  Weight: 209 lb 12.8 oz (95.2 kg)  Height: 5' 6.14" (1.68 m)   Body mass index is 33.72 kg/m.   BP Readings from Last 3 Encounters:  07/12/21 135/84  01/09/21 118/79  09/13/20 131/77    Wt Readings from Last 3 Encounters:  07/12/21 209 lb 12.8 oz (95.2 kg)  01/09/21 192 lb (87.1 kg)  09/13/20 193 lb 12.8 oz (87.9 kg)    Physical Exam Vitals and nursing note reviewed.  Constitutional:       Appearance: Normal appearance. She is well-developed.  HENT:     Head: Normocephalic and atraumatic.  Eyes:     Pupils: Pupils are equal, round, and reactive to light.  Cardiovascular:     Rate and Rhythm: Normal rate and regular rhythm.     Pulses: Normal pulses.     Heart sounds: Normal heart sounds.  Pulmonary:     Effort: Pulmonary effort is normal.     Breath sounds: Normal breath sounds.  Abdominal:     Palpations: Abdomen is soft.  Musculoskeletal:        General: Normal range of motion.     Cervical back: Normal range of motion and neck supple.  Lymphadenopathy:     Cervical: No cervical adenopathy.  Skin:    General: Skin is warm and dry.     Capillary Refill: Capillary refill takes less than 2 seconds.  Neurological:     General: No focal deficit present.     Mental Status: She is alert and oriented to person, place, and time.  Psychiatric:        Mood and Affect: Mood normal.        Behavior: Behavior normal.        Thought Content: Thought content normal.        Judgment: Judgment normal.      Assessment & Plan    1. Acquired hypothyroidism Check thyroid panel today and adjust dose levothyroxine as indicated - TSH + free T4; Future - Basic Metabolic Panel (BMET); Future - Basic Metabolic Panel (BMET) - TSH + free T4  2. Abnormal weight gain Check BMP and LPNP0Y  - Basic Metabolic Panel (BMET); Future - Hemoglobin A1c; Future - Hemoglobin F1T - Basic Metabolic Panel (BMET)  3. BMI 33.0-33.9,adult Trial wegovy 0.20m weekly. Demonstrated proper use of medication pen for patient. She voiced understanding. Discussed lowering calorie intake to 1500 calories per day and incorporating exercise into daily routine to help lose weight. Follow up in 6 weeks for further evaluation.  - Semaglutide-Weight Management (WEGOVY) 0.25 MG/0.5ML SOAJ; Inject 0.25 mg into the skin once a week.  Dispense: 2 mL; Refill: 1  4. Generalized anxiety disorder Trial fluoxetine  132mdaily.  - FLUoxetine (PROZAC) 10 MG tablet; Take 1 tablet (10 mg total) by mouth daily.  Dispense: 30 tablet; Refill: 2    Problem List Items Addressed This Visit       Endocrine   Hypothyroidism - Primary   Relevant Orders   TSH + free T4   Basic Metabolic Panel (BMET)     Other   Abnormal weight gain   Relevant Orders   Basic Metabolic Panel (BMET)   Hemoglobin A1c   Generalized anxiety disorder   Relevant Medications   FLUoxetine (PROZAC) 10 MG tablet  BMI 33.0-33.9,adult   Relevant Medications   Semaglutide-Weight Management (WEGOVY) 0.25 MG/0.5ML SOAJ     Return in about 6 weeks (around 08/23/2021) for routine - weight management - started wegovy. started prozac .         Ronnell Freshwater, NP  [facility] [phone] (phone) [phone] (fax)  [place]

## 2021-07-13 ENCOUNTER — Encounter: Payer: Self-pay | Admitting: *Deleted

## 2021-07-13 LAB — BASIC METABOLIC PANEL
BUN/Creatinine Ratio: 17 (ref 9–23)
BUN: 13 mg/dL (ref 6–24)
CO2: 24 mmol/L (ref 20–29)
Calcium: 9.8 mg/dL (ref 8.7–10.2)
Chloride: 102 mmol/L (ref 96–106)
Creatinine, Ser: 0.76 mg/dL (ref 0.57–1.00)
Glucose: 112 mg/dL — ABNORMAL HIGH (ref 70–99)
Potassium: 4.1 mmol/L (ref 3.5–5.2)
Sodium: 139 mmol/L (ref 134–144)
eGFR: 92 mL/min/{1.73_m2} (ref >59)

## 2021-07-13 LAB — HEMOGLOBIN A1C
Est. average glucose Bld gHb Est-mCnc: 120 mg/dL
Hgb A1c MFr Bld: 5.8 % — ABNORMAL HIGH (ref 4.8–5.6)

## 2021-07-13 LAB — TSH+FREE T4
Free T4: 1.96 ng/dL — ABNORMAL HIGH (ref 0.82–1.77)
TSH: 0.362 u[IU]/mL — ABNORMAL LOW (ref 0.450–4.500)

## 2021-07-14 ENCOUNTER — Ambulatory Visit: Payer: 59 | Admitting: Neurology

## 2021-07-14 ENCOUNTER — Encounter: Payer: Self-pay | Admitting: Neurology

## 2021-07-14 VITALS — BP 140/91 | HR 82 | Ht 66.0 in | Wt 209.8 lb

## 2021-07-14 DIAGNOSIS — G43709 Chronic migraine without aura, not intractable, without status migrainosus: Secondary | ICD-10-CM | POA: Diagnosis not present

## 2021-07-14 DIAGNOSIS — D432 Neoplasm of uncertain behavior of brain, unspecified: Secondary | ICD-10-CM

## 2021-07-14 DIAGNOSIS — G3184 Mild cognitive impairment, so stated: Secondary | ICD-10-CM

## 2021-07-14 NOTE — Progress Notes (Signed)
Chief Complaint  Patient presents with   New Patient (Initial Visit)    RM 14, alone. Paper referral from Ashok Pall at [facility] for headaches, poor balance, no change on head CT w/ shunt in place. Headaches associated with menopause. Started this year. Still having issues with dropping items. Takes ibuprofen for headaches/pressure in head. Sometimes cannot recall words/spell. Headaches/pressure happens once a week now. She wears glasses. Gets blurry vision with episodes as well.       ASSESSMENT AND PLAN  Janet Douglas is a 56 y.o. female   Chronic migraine. Hx of posterior fossa hemangioblastoma resection in 2006, hydrocephalus, status post VP shunt placement Mild cognitive impairment, MoCA examination 27/30, mother has Alzheimer's disease Anxiety  She has increased headache in the setting of major life change, menopause, she is at risk for obstructive sleep apnea, which can contributed to her increased headache as well,  Mild scanned speech, trunk ataxia from residual posterior fossa tumor resection  Laboratory evaluation to rule out treatable cause for memory loss  Encouraged her to keep moderate exercise, weight loss  Aleve as needed for migraine headaches,  DIAGNOSTIC DATA (LABS, IMAGING, TESTING) - I reviewed patient records, labs, notes, testing and imaging myself where available.  April 2006 These findings support the histologic impression of   hemangioblastoma. This type of tumor can be associated with von   Hippel-Lindau syndrome, but is most commonly seen as a sporadic   phenomenon. Clinical correlation is recommended  MRI brain June 08 2004. Large mixed solid and cystic mass in the posterior fossa causing obstructive hydrocephalus.  There is a prominent mass effect upon the pons and medulla and upper cervical spinal cord.  Tonsils extend 15 mm below the foramen magnum.  There is some peritumoral edema in the cerebellar hemispheres.  Differential  diagnosis includes hemangioblastoma, metastasis, and cerebellar glioblastoma.   MEDICAL HISTORY:  Janet Douglas, is a 56 year old female, seen in request by neurosurgeon Dr. Ashok Pall for evaluation of frequent headaches, her primary care is nurse practitioner Leretha Pol E initial evaluation was on Jul 14, 2021  I reviewed and summarized the referring note.PMHX Anxiety Hypothyroidism Obesity Brain tumor in 2006,   She had a posterior fossa hemangioblastoma resection by Dr. 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 Christella Noa recently, no significant abnormality found, personally reviewed repeat CT head on May 21, 2016 2023, unchanged ventricular size with right ventriculostomy in place, midline cerebellar encephalomalacia, and ex vacuo dilatation of the fourth ventricle, unchanged  She describes her headache as bifrontal retro-orbital area pressure pain, lasting for couple hours, usually relieved by ibuprofen, mild light noise sensitivity  She has no worsening neurological symptoms, no visual change,  She is also concerned about her word finding difficulties, mild memory loss since 2021, no limitation in her daily activity, mother-suffered Alzheimer's disease and also migraine headaches.  PHYSICAL EXAM:   Vitals:   07/14/21 0811  BP: (!) 140/91  Pulse: 82  Weight: 209 lb 12.8 oz (95.2 kg)  Height: '5\' 6"'$  (1.676 m)      Body  mass index is 33.86 kg/m.  PHYSICAL EXAMNIATION:  Gen: NAD, conversant, well nourised, well groomed                     Cardiovascular: Regular rate  rhythm, no peripheral edema, warm, nontender. Eyes: Conjunctivae clear without exudates or hemorrhage Neck: Supple, no carotid bruits. Pulmonary: Clear to auscultation bilaterally   NEUROLOGICAL EXAM:  MENTAL STATUS: Speech/cognition: Awake, alert, oriented to history taking and casual conversation, scanned     07/14/2021    9:00 AM  Montreal Cognitive Assessment   Visuospatial/ Executive (0/5) 5  Naming (0/3) 3  Attention: Read list of digits (0/2) 2  Attention: Read list of letters (0/1) 1  Attention: Serial 7 subtraction starting at 100 (0/3) 3  Language: Repeat phrase (0/2) 2  Language : Fluency (0/1) 0  Abstraction (0/2) 2  Delayed Recall (0/5) 3  Orientation (0/6) 6  Total 27    CRANIAL NERVES: CN II: Visual fields are full to confrontation. Pupils are round equal and briskly reactive to light. CN III, IV, VI: extraocular movement are normal. No ptosis. CN V: Facial sensation is intact to light touch CN VII: Face is symmetric with normal eye closure  CN VIII: Hearing is normal to causal conversation. CN IX, X: Phonation is normal. CN XI: Head turning and shoulder shrug are intact CN XII: Mildly slow dysrhythmic tongue movement, narrow oropharyngeal space  MOTOR: There is no pronator drift of out-stretched arms. Muscle bulk and tone are normal. Muscle strength is normal.  REFLEXES: Reflexes are 2+ and symmetric at the biceps, triceps, knees, and ankles. Plantar responses are flexor.  SENSORY: Intact to light touch, pinprick and vibratory sensation are intact in fingers and toes.  COORDINATION: No significant limb dysmetria, mild truncal ataxia  GAIT/STANCE: She needs push-up to get up from seated position, mildly wide-based, unsteady  REVIEW OF SYSTEMS:  Full 14 system review of systems performed and notable only for as above All other review of systems were negative.   ALLERGIES: Allergies  Allergen Reactions   Bupropion Hcl     REACTION: itching     HOME MEDICATIONS: Current Outpatient Medications  Medication Sig Dispense Refill   acetaminophen (TYLENOL) 325 MG tablet Take 650 mg by mouth every 6 (six) hours as needed.     FLUoxetine (PROZAC) 10 MG tablet Take 1 tablet (10 mg total) by mouth daily. 30 tablet 2   ibuprofen (ADVIL) 200 MG tablet Take 200 mg by mouth every 6 (six) hours as needed.     levothyroxine (SYNTHROID) 112 MCG tablet TAKE 1 TABLET BY MOUTH DAILY 90 tablet 1   Semaglutide-Weight Management (WEGOVY) 0.25 MG/0.5ML SOAJ Inject 0.25 mg into the skin once a week. (Patient not taking: Reported on 07/14/2021) 2 mL 1   No current facility-administered medications for this visit.    PAST MEDICAL HISTORY: Past Medical History:  Diagnosis Date   Anemia    NOS   Anxiety    Phreesia 01/04/2020   Arthritis    Phreesia 01/04/2020   Brain tumor ([place])    S/P resection with shunt   Cancer ([place])    Phreesia 01/04/2020   Depression    Phreesia 01/04/2020   Hypothyroidism    Thyroid disease    Phreesia 01/04/2020   Torn rotator cuff     PAST SURGICAL HISTORY: Past Surgical History:  Procedure Laterality Date   Abdominal US  05/19/1999   Negative   Abdominal US  09/19/2008   Normal  BRAIN SURGERY N/A    Phreesia 01/04/2020   Cerebellar Hemanioblastoma  05/20/2004   Surgery   EYE SURGERY     eye lid lift   TUBAL LIGATION      FAMILY HISTORY: Family History  Problem Relation Age of Onset   Thyroid disease Mother    Heart attack Father    Hypertension Father    Thyroid disease Sister    Heart disease Sister        Low heart rate   Hypertension Sister    Thyroid disease Brother    Hypertension Brother    Thyroid disease Brother    Colon cancer Neg Hx     SOCIAL HISTORY: Social History   Socioeconomic History   Marital status: Married    Spouse name: Not on file   Number of children: 1   Years of education: Not on file   Highest education level: Not on file  Occupational History    Occupation: Buyer, retail: [employer]  Tobacco Use   Smoking status: Never   Smokeless tobacco: Never   Tobacco comments:    Father smoked, she was exposed through him  Vaping Use   Vaping Use: Never used  Substance and Sexual Activity   Alcohol use: Yes    Alcohol/week: 0.0 standard drinks    Comment: Minimal- 1/week   Drug use: No   Sexual activity: Yes    Birth control/protection: None  Other Topics Concern   Not on file  Social History Narrative   One son, one step-son.   From [place].   Right handed    Caffeine use:1-2 coke per day, 1 cup coffee per day   Social Determinants of Health   Financial Resource Strain: Not on file  Food Insecurity: Not on file  Transportation Needs: Not on file  Physical Activity: Not on file  Stress: Not on file  Social Connections: Not on file  Intimate Partner Violence: Not on file      Marcial Pacas, M.D. Ph.D.  [facility] [address], [place], [place] [address] Ph: [phone] Fax: [phone]  CC:  Ashok Pall, MD [place] [address] [place],  [place] [address]  Ronnell Freshwater, NP

## 2021-07-14 NOTE — Patient Instructions (Signed)
Aleve as needed.

## 2021-07-15 LAB — VITAMIN B12: Vitamin B-12: 519 pg/mL (ref 232–1245)

## 2021-07-15 LAB — RPR: RPR Ser Ql: NONREACTIVE

## 2021-07-30 ENCOUNTER — Other Ambulatory Visit: Payer: Self-pay | Admitting: Nurse Practitioner

## 2021-07-30 ENCOUNTER — Encounter: Payer: Self-pay | Admitting: Nurse Practitioner

## 2021-07-30 DIAGNOSIS — E039 Hypothyroidism, unspecified: Secondary | ICD-10-CM

## 2021-07-30 MED ORDER — LEVOTHYROXINE SODIUM 100 MCG PO TABS: 100.0000 ug | ORAL_TABLET | Freq: Every day | ORAL | 0 refills | Status: DC

## 2021-07-30 NOTE — Progress Notes (Signed)
Changed dose levothyroine to 135mg daily and sent new prescription to [facility]. Will recheck thyroid panel in 2 months

## 2021-08-30 NOTE — Progress Notes (Deleted)
Established patient visit   Patient: Janet Douglas   DOB: 1966-01-02   56 y.o. Female  MRN: [id number] Visit Date: 08/31/2021   No chief complaint on file.  Subjective    HPI  Routine follow up -weight management.  --started Wegovy 0.25 mg weekly 07/12/2021 --initial weight 07/13/2023 - 209 --today's weight 08/31/2021 -  --weight change since last visit -   -started fluoxetine 10 mg daily due to increased depression and anxiety   -reduced levothyroxine to 100 mcg daily - will be due to have this rechecked at next visit    Medications: Outpatient Medications Prior to Visit  Medication Sig   acetaminophen (TYLENOL) 325 MG tablet Take 650 mg by mouth every 6 (six) hours as needed.   FLUoxetine (PROZAC) 10 MG tablet Take 1 tablet (10 mg total) by mouth daily.   ibuprofen (ADVIL) 200 MG tablet Take 200 mg by mouth every 6 (six) hours as needed.   levothyroxine (SYNTHROID) 100 MCG tablet Take 1 tablet (100 mcg total) by mouth daily.   Semaglutide-Weight Management (WEGOVY) 0.25 MG/0.5ML SOAJ Inject 0.25 mg into the skin once a week. (Patient not taking: Reported on 07/14/2021)   No facility-administered medications prior to visit.    Review of Systems  {Labs (Optional):23779}   Objective    There were no vitals taken for this visit. BP Readings from Last 3 Encounters:  07/14/21 (!) 140/91  07/12/21 135/84  01/09/21 118/79    Wt Readings from Last 3 Encounters:  07/14/21 209 lb 12.8 oz (95.2 kg)  07/12/21 209 lb 12.8 oz (95.2 kg)  01/09/21 192 lb (87.1 kg)    Physical Exam  ***  No results found for any visits on 08/31/21.  Assessment & Plan     Problem List Items Addressed This Visit   None    No follow-ups on file.         Ronnell Freshwater, NP  [facility] at [facility] [phone] (phone) [phone] (fax)  [place]

## 2021-08-31 ENCOUNTER — Ambulatory Visit (INDEPENDENT_AMBULATORY_CARE_PROVIDER_SITE_OTHER): Payer: 59 | Admitting: Nurse Practitioner

## 2021-08-31 ENCOUNTER — Encounter: Payer: Self-pay | Admitting: Nurse Practitioner

## 2021-09-06 NOTE — Progress Notes (Signed)
Established patient visit   Patient: Janet Douglas   DOB: [birth year]   56 y.o. Female  MRN: [id number] Visit Date: 09/07/2021   Chief Complaint  Patient presents with   Weight Check   Subjective    HPI  Follow up visit -hypothyroid --recent labs show that thyroid slightly overactive. Dose levothyroxine lowered to 100 mcg daily.  --will need recheck prior to next visit  -generalized anxiety --trial fluoxetine 10 mg daily.  --states that she is doing well with this dosing. Would like to continue   Medications: Outpatient Medications Prior to Visit  Medication Sig   acetaminophen (TYLENOL) 325 MG tablet Take 650 mg by mouth every 6 (six) hours as needed.   ibuprofen (ADVIL) 200 MG tablet Take 200 mg by mouth every 6 (six) hours as needed.   [DISCONTINUED] FLUoxetine (PROZAC) 10 MG tablet Take 1 tablet (10 mg total) by mouth daily.   [DISCONTINUED] levothyroxine (SYNTHROID) 100 MCG tablet Take 1 tablet (100 mcg total) by mouth daily.   [DISCONTINUED] Semaglutide-Weight Management (WEGOVY) 0.25 MG/0.5ML SOAJ Inject 0.25 mg into the skin once a week. (Patient not taking: Reported on 07/14/2021)   No facility-administered medications prior to visit.    Review of Systems  Constitutional:  Negative for activity change, appetite change, chills, fatigue and fever.       Three pound weight loss since last visit.   HENT:  Negative for congestion, postnasal drip, rhinorrhea, sinus pressure, sinus pain, sneezing and sore throat.   Eyes: Negative.   Respiratory:  Negative for cough, chest tightness, shortness of breath and wheezing.   Cardiovascular:  Negative for chest pain and palpitations.  Gastrointestinal:  Negative for abdominal pain, constipation, diarrhea, nausea and vomiting.  Endocrine: Negative for cold intolerance, heat intolerance, polydipsia and polyuria.  Genitourinary:  Negative for dyspareunia, dysuria, flank pain, frequency and urgency.  Musculoskeletal:  Negative for  arthralgias, back pain and myalgias.  Skin:  Negative for rash.  Allergic/Immunologic: Negative for environmental allergies.  Neurological:  Negative for dizziness, weakness and headaches.  Hematological:  Negative for adenopathy.  Psychiatric/Behavioral:  The patient is not nervous/anxious.     Last CBC Lab Results  Component Value Date   WBC 7.3 01/04/2021   HGB 11.9 01/04/2021   HCT 36.6 01/04/2021   MCV 63 (L) 01/04/2021   MCH 20.4 (L) 01/04/2021   RDW 16.4 (H) 01/04/2021   PLT 294 32/95/1884   Last metabolic panel Lab Results  Component Value Date   GLUCOSE 112 (H) 07/12/2021   NA 139 07/12/2021   K 4.1 07/12/2021   CL 102 07/12/2021   CO2 24 07/12/2021   BUN 13 07/12/2021   CREATININE 0.76 07/12/2021   EGFR 92 07/12/2021   CALCIUM 9.8 07/12/2021   PROT 6.9 01/04/2021   ALBUMIN 4.6 01/04/2021   LABGLOB 2.3 01/04/2021   AGRATIO 2.0 01/04/2021   BILITOT 0.5 01/04/2021   ALKPHOS 92 01/04/2021   AST 17 01/04/2021   ALT 16 01/04/2021   ANIONGAP 7 05/01/2018   Last lipids Lab Results  Component Value Date   CHOL 195 01/04/2021   HDL 51 01/04/2021   LDLCALC 126 (H) 01/04/2021   LDLDIRECT 99.0 08/31/2014   TRIG 97 01/04/2021   CHOLHDL 3.8 01/04/2021   Last hemoglobin A1c Lab Results  Component Value Date   HGBA1C 5.8 (H) 07/12/2021   Last thyroid functions Lab Results  Component Value Date   TSH 0.362 (L) 07/12/2021        Objective  Today's Vitals   09/07/21 1533  BP: 123/79  Pulse: 68  SpO2: 96%  Weight: 206 lb 9.6 oz (93.7 kg)  Height: 5' 6.14" (1.68 m)   Body mass index is 33.2 kg/m.   BP Readings from Last 3 Encounters:  09/07/21 123/79  07/14/21 (!) 140/91  07/12/21 135/84    Wt Readings from Last 3 Encounters:  09/07/21 206 lb 9.6 oz (93.7 kg)  07/14/21 209 lb 12.8 oz (95.2 kg)  07/12/21 209 lb 12.8 oz (95.2 kg)    Physical Exam Vitals and nursing note reviewed.  Constitutional:      Appearance: Normal appearance. She  is well-developed.  HENT:     Head: Normocephalic and atraumatic.     Nose: Nose normal.     Mouth/Throat:     Mouth: Mucous membranes are moist.     Pharynx: Oropharynx is clear.  Eyes:     Extraocular Movements: Extraocular movements intact.     Conjunctiva/sclera: Conjunctivae normal.     Pupils: Pupils are equal, round, and reactive to light.  Cardiovascular:     Rate and Rhythm: Normal rate and regular rhythm.     Pulses: Normal pulses.     Heart sounds: Normal heart sounds.  Pulmonary:     Effort: Pulmonary effort is normal.     Breath sounds: Normal breath sounds.  Abdominal:     Palpations: Abdomen is soft.  Musculoskeletal:        General: Normal range of motion.     Cervical back: Normal range of motion and neck supple.  Lymphadenopathy:     Cervical: No cervical adenopathy.  Skin:    General: Skin is warm and dry.     Capillary Refill: Capillary refill takes less than 2 seconds.  Neurological:     General: No focal deficit present.     Mental Status: She is alert and oriented to person, place, and time.  Psychiatric:        Mood and Affect: Mood normal.        Behavior: Behavior normal.        Thought Content: Thought content normal.        Judgment: Judgment normal.      Assessment & Plan    1. Hypothyroidism, unspecified type Continue levothyroxine at 100 mcg daily. Recheck thyroid panel during next visit  - levothyroxine (SYNTHROID) 100 MCG tablet; Take 1 tablet (100 mcg total) by mouth daily.  Dispense: 90 tablet; Refill: 1  2. Generalized anxiety disorder Improved and stable. Continue prozac as prescribed  - FLUoxetine (PROZAC) 10 MG tablet; Take 1 tablet (10 mg total) by mouth daily.  Dispense: 90 tablet; Refill: 1  3. BMI 33.0-33.9,adult Trial saxenda starting with 0.6 mg. Instructions reviewed to  titrate dosing to 3 mg daily as tolerated. Discussed lowering calorie intake to 1500 calories per day and incorporating exercise into daily routine to  help lose weight. - Liraglutide -Weight Management (SAXENDA) 18 MG/3ML SOPN; Inject 0.6 mg [place] once daily x 1 wk. Then increase dose by 0.6 mg/day every 7 days to target of 3 mg/day.  Dispense: 15 mL; Refill: 0 - Insulin Pen Needle 32G X 4 MM MISC; To use daily with saxenda injectoins  Dispense: 100 each; Refill: 1   Problem List Items Addressed This Visit       Endocrine   Hypothyroidism - Primary   Relevant Medications   levothyroxine (SYNTHROID) 100 MCG tablet     Other   Generalized anxiety disorder  Relevant Medications   FLUoxetine (PROZAC) 10 MG tablet   BMI 33.0-33.9,adult   Relevant Medications   Liraglutide -Weight Management (SAXENDA) 18 MG/3ML SOPN   Insulin Pen Needle 32G X 4 MM MISC     Return for prn - patient in process of moving to [facility] [place]         Ronnell Freshwater, NP  [facility] at [facility] [phone] (phone) [phone] (fax)  [place]

## 2021-09-07 ENCOUNTER — Encounter: Payer: Self-pay | Admitting: Nurse Practitioner

## 2021-09-07 ENCOUNTER — Ambulatory Visit: Payer: 59 | Admitting: Nurse Practitioner

## 2021-09-07 ENCOUNTER — Ambulatory Visit (INDEPENDENT_AMBULATORY_CARE_PROVIDER_SITE_OTHER): Payer: 59 | Admitting: Nurse Practitioner

## 2021-09-07 VITALS — BP 123/79 | HR 68 | Ht 66.14 in | Wt 206.6 lb

## 2021-09-07 DIAGNOSIS — E039 Hypothyroidism, unspecified: Secondary | ICD-10-CM | POA: Diagnosis not present

## 2021-09-07 DIAGNOSIS — Z6833 Body mass index (BMI) 33.0-33.9, adult: Secondary | ICD-10-CM

## 2021-09-07 DIAGNOSIS — F411 Generalized anxiety disorder: Secondary | ICD-10-CM | POA: Diagnosis not present

## 2021-09-07 MED ORDER — FLUOXETINE HCL 10 MG PO TABS: 10.0000 mg | ORAL_TABLET | Freq: Every day | ORAL | 1 refills | Status: AC

## 2021-09-07 MED ORDER — INSULIN PEN NEEDLE 32G X 4 MM MISC: 1 refills | Status: AC

## 2021-09-07 MED ORDER — LEVOTHYROXINE SODIUM 100 MCG PO TABS
100.0000 ug | ORAL_TABLET | Freq: Every day | ORAL | 1 refills | Status: AC
Start: 2021-09-07 — End: ?

## 2021-09-07 MED ORDER — SAXENDA 18 MG/3ML ~~LOC~~ SOPN: PEN_INJECTOR | SUBCUTANEOUS | 0 refills | Status: AC

## 2021-11-27 NOTE — Progress Notes (Signed)
Appointment was cancelled by provider
